# Patient Record
Sex: Male | Born: 1965 | Race: Black or African American | Hispanic: No | Marital: Married | Smoking: Never smoker
Health system: Southern US, Community
[De-identification: ages and names within clinical notes are randomized; demographics above are authoritative.]

## PROBLEM LIST (undated history)

## (undated) DIAGNOSIS — I10 Essential (primary) hypertension: Secondary | ICD-10-CM

## (undated) DIAGNOSIS — I509 Heart failure, unspecified: Secondary | ICD-10-CM

## (undated) DIAGNOSIS — N289 Disorder of kidney and ureter, unspecified: Secondary | ICD-10-CM

---

## 2007-06-16 ENCOUNTER — Emergency Department (HOSPITAL_COMMUNITY): Admission: EM | Admit: 2007-06-16 | Discharge: 2007-06-16 | Payer: Self-pay | Admitting: Emergency Medicine

## 2015-02-16 DIAGNOSIS — I2510 Atherosclerotic heart disease of native coronary artery without angina pectoris: Secondary | ICD-10-CM | POA: Insufficient documentation

## 2015-02-16 DIAGNOSIS — I10 Essential (primary) hypertension: Secondary | ICD-10-CM | POA: Diagnosis present

## 2017-04-28 DIAGNOSIS — N183 Chronic kidney disease, stage 3 unspecified: Secondary | ICD-10-CM | POA: Diagnosis present

## 2017-06-13 DIAGNOSIS — I428 Other cardiomyopathies: Secondary | ICD-10-CM

## 2019-04-18 DIAGNOSIS — E785 Hyperlipidemia, unspecified: Secondary | ICD-10-CM | POA: Insufficient documentation

## 2020-03-26 DIAGNOSIS — Z8639 Personal history of other endocrine, nutritional and metabolic disease: Secondary | ICD-10-CM | POA: Insufficient documentation

## 2020-11-29 ENCOUNTER — Emergency Department (HOSPITAL_COMMUNITY)
Admission: EM | Admit: 2020-11-29 | Discharge: 2020-11-29 | Disposition: A | Discharge status: Home / Self Care | Payer: BC Managed Care – PPO | Attending: Emergency Medicine | Admitting: Emergency Medicine

## 2020-11-29 ENCOUNTER — Encounter (HOSPITAL_COMMUNITY): Payer: Self-pay

## 2020-11-29 ENCOUNTER — Other Ambulatory Visit: Payer: Self-pay

## 2020-11-29 DIAGNOSIS — Z960 Presence of urogenital implants: Secondary | ICD-10-CM | POA: Insufficient documentation

## 2020-11-29 DIAGNOSIS — I509 Heart failure, unspecified: Secondary | ICD-10-CM | POA: Diagnosis not present

## 2020-11-29 DIAGNOSIS — I130 Hypertensive heart and chronic kidney disease with heart failure and stage 1 through stage 4 chronic kidney disease, or unspecified chronic kidney disease: Secondary | ICD-10-CM | POA: Diagnosis not present

## 2020-11-29 DIAGNOSIS — R339 Retention of urine, unspecified: Secondary | ICD-10-CM | POA: Insufficient documentation

## 2020-11-29 DIAGNOSIS — N189 Chronic kidney disease, unspecified: Secondary | ICD-10-CM | POA: Insufficient documentation

## 2020-11-29 HISTORY — DX: Essential (primary) hypertension: I10

## 2020-11-29 HISTORY — DX: Heart failure, unspecified: I50.9

## 2020-11-29 HISTORY — DX: Disorder of kidney and ureter, unspecified: N28.9

## 2020-11-29 LAB — BASIC METABOLIC PANEL
Anion gap: 15 (ref 5–15)
BUN: 23 mg/dL — ABNORMAL HIGH (ref 6–20)
CO2: 29 mmol/L (ref 22–32)
Calcium: 8.9 mg/dL (ref 8.9–10.3)
Chloride: 97 mmol/L — ABNORMAL LOW (ref 98–111)
Creatinine, Ser: 1.75 mg/dL — ABNORMAL HIGH (ref 0.61–1.24)
GFR, Estimated: 45 mL/min — ABNORMAL LOW (ref >60)
Glucose, Bld: 118 mg/dL — ABNORMAL HIGH (ref 70–99)
Potassium: 2.7 mmol/L — CL (ref 3.5–5.1)
Sodium: 141 mmol/L (ref 135–145)

## 2020-11-29 LAB — URINALYSIS, ROUTINE W REFLEX MICROSCOPIC
Bacteria, UA: NONE SEEN
Bilirubin Urine: NEGATIVE
Glucose, UA: NEGATIVE mg/dL
Ketones, ur: NEGATIVE mg/dL
Leukocytes,Ua: NEGATIVE
Nitrite: NEGATIVE
Protein, ur: 30 mg/dL — AB
Specific Gravity, Urine: 1.006 (ref 1.005–1.030)
pH: 8 (ref 5.0–8.0)

## 2020-11-29 LAB — CBC
HCT: 48.1 % (ref 39.0–52.0)
Hemoglobin: 16.1 g/dL (ref 13.0–17.0)
MCH: 32.1 pg (ref 26.0–34.0)
MCHC: 33.5 g/dL (ref 30.0–36.0)
MCV: 95.8 fL (ref 80.0–100.0)
Platelets: 225 10*3/uL (ref 150–400)
RBC: 5.02 MIL/uL (ref 4.22–5.81)
RDW: 15.5 % (ref 11.5–15.5)
WBC: 6 10*3/uL (ref 4.0–10.5)
nRBC: 0 % (ref 0.0–0.2)

## 2020-11-29 LAB — MAGNESIUM: Magnesium: 1.5 mg/dL — ABNORMAL LOW (ref 1.7–2.4)

## 2020-11-29 MED ORDER — POTASSIUM CHLORIDE 10 MEQ/100ML IV SOLN
10.0000 meq | INTRAVENOUS | Status: AC
Start: 2020-11-29 — End: 2020-11-29
  Administered 2020-11-29 (×2): 10 meq via INTRAVENOUS
  Filled 2020-11-29 (×2): qty 100

## 2020-11-29 MED ORDER — POTASSIUM CHLORIDE CRYS ER 20 MEQ PO TBCR: 20.0000 meq | EXTENDED_RELEASE_TABLET | Freq: Two times a day (BID) | ORAL | 0 refills | Status: DC

## 2020-11-29 MED ORDER — POTASSIUM CHLORIDE CRYS ER 20 MEQ PO TBCR
40.0000 meq | EXTENDED_RELEASE_TABLET | Freq: Once | ORAL | Status: AC
  Administered 2020-11-29: 40 meq via ORAL
  Filled 2020-11-29: qty 2

## 2020-11-29 MED ORDER — MAGNESIUM SULFATE 2 GM/50ML IV SOLN
2.0000 g | Freq: Once | INTRAVENOUS | Status: AC
  Administered 2020-11-29: 2 g via INTRAVENOUS
  Filled 2020-11-29: qty 50

## 2020-11-29 NOTE — ED Notes (Signed)
Leg bag connected - instructed pt and family how to empty.

## 2020-11-29 NOTE — ED Triage Notes (Signed)
Pt reports urinary retention for several hours. Pt recently began on Torsemide for HTN. Suprapubic pain.

## 2020-11-29 NOTE — ED Provider Notes (Signed)
[facility]-EMERGENCY DEPT Provider Note   CSN: [id number] Arrival date & time: 11/29/20  3810     History Chief Complaint  Patient presents with  . Urinary Retention    Oscar Rosales is a 55 y.o. male with history of severe hypertension and congestive heart failure as well as CKD who presents with concern for urinary retention that began around 3:00 this morning.  He recently was started on torsemide, and has not had any issue however he states that in the middle of the night he woke to urinate x2 without difficulty and subsequently woke a third time to urinate was not able to do so.  Endorses gradually worsening suprapubic pain after 3:00 this morning, and inability to urinate despite multiple attempts at effort.  He has never had an issue with urinary retention in the past.  Has no history of BPH.  He denies any hematuria, dysuria, but endorses urinary frequency and urgency since starting torsemide.  He denies any fevers or chills at home.  Was recently admitted to [facility] in January 2022 for COVID-19 pneumonia and acute respiratory failure.  I personally reviewed his previous CHF on digoxin, hypertension on multiple oral medications + clonidine patch, chronic kidney disease, history of hep C, dyslipidemia.  He follows with Dr. Sullivan Lone at [facility] cardiology.  LVEF 25 to 30% on TTE in 6/21.  HPI     Past Medical History:  Diagnosis Date  . CHF (congestive heart failure) (HCC)   . Hypertension   . Renal disorder     There are no problems to display for this patient.   History reviewed. No pertinent surgical history.     No family history on file.  Social History   Tobacco Use  . Smoking status: Never Smoker  . Smokeless tobacco: Never Used  Substance Use Topics  . Drug use: Never    Home Medications Prior to Admission medications   Not on File    Allergies    Lisinopril  Review of Systems   Review of  Systems  Constitutional: Negative.  Negative for chills, fatigue and fever.  HENT: Negative.   Eyes: Negative.   Respiratory: Negative.   Cardiovascular: Negative.   Gastrointestinal: Positive for abdominal pain. Negative for constipation, diarrhea, nausea and vomiting.  Genitourinary: Positive for difficulty urinating. Negative for decreased urine volume, dysuria, flank pain, frequency, hematuria, penile discharge, penile pain, penile swelling, scrotal swelling, testicular pain and urgency.  Musculoskeletal: Negative.   Skin: Negative.   Neurological: Negative.     Physical Exam Updated Vital Signs BP (!) 175/124   Pulse 100   Temp 97.7 F (36.5 C) (Oral)   Resp (!) 27   Ht 5\' 11"  (1.803 m)   Wt 77.1 kg   SpO2 96%   BMI 23.71 kg/m   Physical Exam Vitals and nursing note reviewed. Exam conducted with a chaperone present.  Constitutional:      Appearance: He is normal weight.  HENT:     Head: Normocephalic and atraumatic.     Nose: Nose normal.     Mouth/Throat:     Mouth: Mucous membranes are moist.     Pharynx: Oropharynx is clear. Uvula midline. No oropharyngeal exudate or posterior oropharyngeal erythema.     Tonsils: No tonsillar exudate.  Eyes:     General: Lids are normal. Vision grossly intact.        Right eye: No discharge.  Left eye: No discharge.     Extraocular Movements: Extraocular movements intact.     Conjunctiva/sclera: Conjunctivae normal.     Pupils: Pupils are equal, round, and reactive to light.  Neck:     Trachea: Trachea normal.  Cardiovascular:     Rate and Rhythm: Normal rate. Rhythm irregular.     Pulses: Normal pulses.     Heart sounds: Normal heart sounds. No murmur heard.   Pulmonary:     Effort: Pulmonary effort is normal. No respiratory distress.     Breath sounds: Normal breath sounds. No wheezing or rales.  Chest:     Chest wall: No swelling, tenderness, crepitus or edema.  Abdominal:     General: Bowel sounds are  normal. There is no distension.     Palpations: Abdomen is soft.     Tenderness: There is no abdominal tenderness.  Genitourinary:    Penis: Normal.      Testes: Normal.     Comments: Foley catheter in place, 350 cc in the foley bag.  Musculoskeletal:        General: No deformity.     Cervical back: Neck supple. No tenderness or crepitus. No pain with movement, spinous process tenderness or muscular tenderness.     Right lower leg: 1+ Edema present.     Left lower leg: 2+ Edema present.  Lymphadenopathy:     Cervical: No cervical adenopathy.  Skin:    General: Skin is warm and dry.     Capillary Refill: Capillary refill takes less than 2 seconds.  Neurological:     General: No focal deficit present.     Mental Status: He is alert and oriented to person, place, and time. Mental status is at baseline.     Cranial Nerves: Cranial nerves are intact.     Sensory: Sensation is intact.     Motor: Motor function is intact.     Coordination: Coordination is intact.  Psychiatric:        Mood and Affect: Mood normal.     ED Results / Procedures / Treatments   Labs (all labs ordered are listed, but only abnormal results are displayed) Labs Reviewed  URINALYSIS, ROUTINE W REFLEX MICROSCOPIC - Abnormal; Notable for the following components:      Result Value   Color, Urine STRAW (*)    Hgb urine dipstick SMALL (*)    Protein, ur 30 (*)    All other components within normal limits  BASIC METABOLIC PANEL - Abnormal; Notable for the following components:   Potassium 2.7 (*)    Chloride 97 (*)    Glucose, Bld 118 (*)    BUN 23 (*)    Creatinine, Ser 1.75 (*)    GFR, Estimated 45 (*)    All other components within normal limits  MAGNESIUM - Abnormal; Notable for the following components:   Magnesium 1.5 (*)    All other components within normal limits  URINE CULTURE  CBC    EKG EKG Interpretation  Date/Time:  Thursday November 29 2020 08:51:23 EDT Ventricular Rate:  85 PR  Interval:    QRS Duration: 109 QT Interval:  404 QTC Calculation: 481 R Axis:   -38 Text Interpretation: Sinus rhythm Biatrial enlargement Abnormal R-wave progression, late transition LVH with secondary repolarization abnormality Confirmed by Kristine Royal [phone]) on 11/29/2020 9:04:13 AM   Radiology No results found.  Procedures Procedures  Medications Ordered in ED Medications  potassium chloride 10 mEq in 100 mL IVPB (0  mEq Intravenous Stopped 11/29/20 1109)  potassium chloride SA (KLOR-CON) CR tablet 40 mEq (40 mEq Oral Given 11/29/20 0744)  magnesium sulfate IVPB 2 g 50 mL (2 g Intravenous New Bag/Given 11/29/20 1132)    ED Course  I have reviewed the triage vital signs and the nursing notes.  Pertinent labs & imaging results that were available during my care of the patient were reviewed by me and considered in my medical decision making (see chart for details).    MDM Rules/Calculators/A&P                         55 year old male with history of CHF, hypertension, and chronic kidney disease who presents with concern for urinary retention since 3 AM today.  Hypertensive and tachycardic on intake.  Vital signs improved after placement of Foley catheter.  Cardiopulmonary exam revealed irregular heart rhythm, abdominal exam is benign at this time, and there is 350 cc of urine in the Foley catheter at the time of my initial exam.  There is L>R lower extremity edema, nonpitting.  Patient states that has improved since beginning torsemide.  UA revealed small amount of hemoglobin and proteinuria without sign of infection.  CBC is unremarkable, BMP revealed baseline creatinine 1.75, and severe hypokalemia with potassium of 2.7.  EKG without STEMI. Hypomagnesemia of 1.5.  Will replete potassium orally and via IV, will replete magnesium IV.  Patient will be discharged with Foley catheter in place with recommendation to follow-up closely with urology for removal and further evaluation, as  well as cardiology for evaluation of torsemide dosage.   Loi and his wife at the bedside voiced understanding of his medical evaluation and treatment plan.  Each of their questions was answered to their expressed satisfaction.  Return precautions given.  Patient is stable and appropriate for discharge.  This chart was dictated using voice recognition software, Dragon. Despite the best efforts of this provider to proofread and correct errors, errors may still occur which can change documentation meaning.  Final Clinical Impression(s) / ED Diagnoses Final diagnoses:  Urinary retention    Rx / DC Orders ED Discharge Orders    None       Sherrilee Gilles 11/29/20 1232    Wynetta Fines, MD 12/01/20 2158

## 2020-11-29 NOTE — ED Notes (Signed)
650 ml of urine emptied from folly catheter

## 2020-11-29 NOTE — Discharge Instructions (Addendum)
You were evaluated in the emergency department for your inability to urinate this morning.  Foley catheter was placed to help your body drain urine, and prevent you from experiencing the same pain you had this morning.    Your blood work revealed that your potassium and magnesium are low.  Both of these were supplemented through your IV.  You have been prescribed potassium to take at home for the next few days, and should continue to take your magnesium supplement at home. Please call your cardiologist to discuss the dosage of your torsemide, and any adjustments they may need to make after today, since your potassium and magnesium were low.  Below is the contact information for Dr. Benancio Deeds, the urologist; please call his office today to schedule follow-up appointment for removal of her Foley catheter.  Return emergency department develop any worsening abdominal pain, nausea or vomiting does not stop, penile pain, fevers, chills, or any other new severe symptoms.

## 2020-11-30 LAB — URINE CULTURE
Culture: NO GROWTH
Special Requests: NORMAL

## 2021-02-15 DIAGNOSIS — Z9114 Patient's other noncompliance with medication regimen: Secondary | ICD-10-CM | POA: Insufficient documentation

## 2021-10-11 ENCOUNTER — Encounter (HOSPITAL_COMMUNITY): Payer: Self-pay | Admitting: Emergency Medicine

## 2021-10-11 ENCOUNTER — Emergency Department (HOSPITAL_COMMUNITY): Payer: BC Managed Care – PPO

## 2021-10-11 ENCOUNTER — Other Ambulatory Visit: Payer: Self-pay

## 2021-10-11 ENCOUNTER — Inpatient Hospital Stay (HOSPITAL_COMMUNITY)
Admission: EM | Admit: 2021-10-11 | Discharge: 2021-10-25 | DRG: 064 | Disposition: A | Discharge status: Rehabilitation Facility | Payer: BC Managed Care – PPO | Attending: Internal Medicine | Admitting: Internal Medicine

## 2021-10-11 DIAGNOSIS — D509 Iron deficiency anemia, unspecified: Secondary | ICD-10-CM | POA: Diagnosis present

## 2021-10-11 DIAGNOSIS — I2510 Atherosclerotic heart disease of native coronary artery without angina pectoris: Secondary | ICD-10-CM | POA: Diagnosis present

## 2021-10-11 DIAGNOSIS — Z681 Body mass index (BMI) 19 or less, adult: Secondary | ICD-10-CM

## 2021-10-11 DIAGNOSIS — E785 Hyperlipidemia, unspecified: Secondary | ICD-10-CM | POA: Diagnosis present

## 2021-10-11 DIAGNOSIS — I119 Hypertensive heart disease without heart failure: Secondary | ICD-10-CM | POA: Diagnosis present

## 2021-10-11 DIAGNOSIS — W19XXXA Unspecified fall, initial encounter: Secondary | ICD-10-CM

## 2021-10-11 DIAGNOSIS — Z888 Allergy status to other drugs, medicaments and biological substances status: Secondary | ICD-10-CM

## 2021-10-11 DIAGNOSIS — I639 Cerebral infarction, unspecified: Secondary | ICD-10-CM

## 2021-10-11 DIAGNOSIS — I6340 Cerebral infarction due to embolism of unspecified cerebral artery: Secondary | ICD-10-CM | POA: Diagnosis not present

## 2021-10-11 DIAGNOSIS — Z91199 Patient's noncompliance with other medical treatment and regimen due to unspecified reason: Secondary | ICD-10-CM

## 2021-10-11 DIAGNOSIS — N183 Chronic kidney disease, stage 3 unspecified: Secondary | ICD-10-CM | POA: Diagnosis present

## 2021-10-11 DIAGNOSIS — N179 Acute kidney failure, unspecified: Secondary | ICD-10-CM | POA: Diagnosis present

## 2021-10-11 DIAGNOSIS — I493 Ventricular premature depolarization: Secondary | ICD-10-CM | POA: Diagnosis not present

## 2021-10-11 DIAGNOSIS — I428 Other cardiomyopathies: Secondary | ICD-10-CM

## 2021-10-11 DIAGNOSIS — I4891 Unspecified atrial fibrillation: Secondary | ICD-10-CM | POA: Diagnosis present

## 2021-10-11 DIAGNOSIS — R410 Disorientation, unspecified: Secondary | ICD-10-CM

## 2021-10-11 DIAGNOSIS — Z20822 Contact with and (suspected) exposure to covid-19: Secondary | ICD-10-CM | POA: Diagnosis present

## 2021-10-11 DIAGNOSIS — T1490XA Injury, unspecified, initial encounter: Secondary | ICD-10-CM

## 2021-10-11 DIAGNOSIS — I169 Hypertensive crisis, unspecified: Secondary | ICD-10-CM | POA: Diagnosis present

## 2021-10-11 DIAGNOSIS — H5702 Anisocoria: Secondary | ICD-10-CM | POA: Diagnosis present

## 2021-10-11 DIAGNOSIS — R0902 Hypoxemia: Secondary | ICD-10-CM

## 2021-10-11 DIAGNOSIS — N1832 Chronic kidney disease, stage 3b: Secondary | ICD-10-CM | POA: Diagnosis present

## 2021-10-11 DIAGNOSIS — Z79899 Other long term (current) drug therapy: Secondary | ICD-10-CM

## 2021-10-11 DIAGNOSIS — I5023 Acute on chronic systolic (congestive) heart failure: Secondary | ICD-10-CM | POA: Diagnosis present

## 2021-10-11 DIAGNOSIS — E876 Hypokalemia: Secondary | ICD-10-CM | POA: Diagnosis not present

## 2021-10-11 DIAGNOSIS — I43 Cardiomyopathy in diseases classified elsewhere: Secondary | ICD-10-CM | POA: Diagnosis present

## 2021-10-11 DIAGNOSIS — I472 Ventricular tachycardia, unspecified: Secondary | ICD-10-CM | POA: Diagnosis not present

## 2021-10-11 DIAGNOSIS — E440 Moderate protein-calorie malnutrition: Secondary | ICD-10-CM | POA: Insufficient documentation

## 2021-10-11 DIAGNOSIS — Z9114 Patient's other noncompliance with medication regimen: Secondary | ICD-10-CM

## 2021-10-11 DIAGNOSIS — D539 Nutritional anemia, unspecified: Secondary | ICD-10-CM | POA: Diagnosis present

## 2021-10-11 DIAGNOSIS — I130 Hypertensive heart and chronic kidney disease with heart failure and stage 1 through stage 4 chronic kidney disease, or unspecified chronic kidney disease: Secondary | ICD-10-CM | POA: Diagnosis present

## 2021-10-11 DIAGNOSIS — G9340 Encephalopathy, unspecified: Secondary | ICD-10-CM | POA: Diagnosis present

## 2021-10-11 DIAGNOSIS — R29702 NIHSS score 2: Secondary | ICD-10-CM | POA: Diagnosis present

## 2021-10-11 DIAGNOSIS — I2699 Other pulmonary embolism without acute cor pulmonale: Secondary | ICD-10-CM

## 2021-10-11 DIAGNOSIS — I509 Heart failure, unspecified: Secondary | ICD-10-CM

## 2021-10-11 DIAGNOSIS — I7121 Aneurysm of the ascending aorta, without rupture: Secondary | ICD-10-CM | POA: Diagnosis present

## 2021-10-11 DIAGNOSIS — I214 Non-ST elevation (NSTEMI) myocardial infarction: Secondary | ICD-10-CM

## 2021-10-11 DIAGNOSIS — D7589 Other specified diseases of blood and blood-forming organs: Secondary | ICD-10-CM | POA: Diagnosis not present

## 2021-10-11 DIAGNOSIS — G4733 Obstructive sleep apnea (adult) (pediatric): Secondary | ICD-10-CM | POA: Diagnosis present

## 2021-10-11 DIAGNOSIS — I10 Essential (primary) hypertension: Secondary | ICD-10-CM

## 2021-10-11 LAB — CBC
HCT: 45.3 % (ref 39.0–52.0)
Hemoglobin: 14.1 g/dL (ref 13.0–17.0)
MCH: 31.2 pg (ref 26.0–34.0)
MCHC: 31.1 g/dL (ref 30.0–36.0)
MCV: 100.2 fL — ABNORMAL HIGH (ref 80.0–100.0)
Platelets: 165 10*3/uL (ref 150–400)
RBC: 4.52 MIL/uL (ref 4.22–5.81)
RDW: 15.4 % (ref 11.5–15.5)
WBC: 6.5 10*3/uL (ref 4.0–10.5)
nRBC: 0 % (ref 0.0–0.2)

## 2021-10-11 LAB — OSMOLALITY: Osmolality: 291 mOsm/kg (ref 275–295)

## 2021-10-11 LAB — URINALYSIS, ROUTINE W REFLEX MICROSCOPIC
Bilirubin Urine: NEGATIVE
Glucose, UA: NEGATIVE mg/dL
Hgb urine dipstick: NEGATIVE
Ketones, ur: NEGATIVE mg/dL
Leukocytes,Ua: NEGATIVE
Nitrite: NEGATIVE
Protein, ur: 100 mg/dL — AB
Specific Gravity, Urine: 1.016 (ref 1.005–1.030)
pH: 5 (ref 5.0–8.0)

## 2021-10-11 LAB — I-STAT VENOUS BLOOD GAS, ED
Acid-Base Excess: 5 mmol/L — ABNORMAL HIGH (ref 0.0–2.0)
Bicarbonate: 28.6 mmol/L — ABNORMAL HIGH (ref 20.0–28.0)
Calcium, Ion: 1.03 mmol/L — ABNORMAL LOW (ref 1.15–1.40)
HCT: 43 % (ref 39.0–52.0)
Hemoglobin: 14.6 g/dL (ref 13.0–17.0)
O2 Saturation: 77 %
Potassium: 3.5 mmol/L (ref 3.5–5.1)
Sodium: 139 mmol/L (ref 135–145)
TCO2: 30 mmol/L (ref 22–32)
pCO2, Ven: 36.5 mmHg — ABNORMAL LOW (ref 44.0–60.0)
pH, Ven: 7.502 — ABNORMAL HIGH (ref 7.250–7.430)
pO2, Ven: 38 mmHg (ref 32.0–45.0)

## 2021-10-11 LAB — COMPREHENSIVE METABOLIC PANEL
ALT: 27 U/L (ref 0–44)
AST: 30 U/L (ref 15–41)
Albumin: 2.6 g/dL — ABNORMAL LOW (ref 3.5–5.0)
Alkaline Phosphatase: 61 U/L (ref 38–126)
Anion gap: 12 (ref 5–15)
BUN: 27 mg/dL — ABNORMAL HIGH (ref 6–20)
CO2: 24 mmol/L (ref 22–32)
Calcium: 8.3 mg/dL — ABNORMAL LOW (ref 8.9–10.3)
Chloride: 103 mmol/L (ref 98–111)
Creatinine, Ser: 1.88 mg/dL — ABNORMAL HIGH (ref 0.61–1.24)
GFR, Estimated: 41 mL/min — ABNORMAL LOW (ref >60)
Glucose, Bld: 93 mg/dL (ref 70–99)
Potassium: 3.4 mmol/L — ABNORMAL LOW (ref 3.5–5.1)
Sodium: 139 mmol/L (ref 135–145)
Total Bilirubin: 1.4 mg/dL — ABNORMAL HIGH (ref 0.3–1.2)
Total Protein: 5.2 g/dL — ABNORMAL LOW (ref 6.5–8.1)

## 2021-10-11 LAB — PROTIME-INR
INR: 1.3 — ABNORMAL HIGH (ref 0.8–1.2)
Prothrombin Time: 15.7 seconds — ABNORMAL HIGH (ref 11.4–15.2)

## 2021-10-11 LAB — I-STAT CHEM 8, ED
BUN: 34 mg/dL — ABNORMAL HIGH (ref 6–20)
Calcium, Ion: 1.01 mmol/L — ABNORMAL LOW (ref 1.15–1.40)
Chloride: 102 mmol/L (ref 98–111)
Creatinine, Ser: 1.9 mg/dL — ABNORMAL HIGH (ref 0.61–1.24)
Glucose, Bld: 91 mg/dL (ref 70–99)
HCT: 44 % (ref 39.0–52.0)
Hemoglobin: 15 g/dL (ref 13.0–17.0)
Potassium: 3.5 mmol/L (ref 3.5–5.1)
Sodium: 139 mmol/L (ref 135–145)
TCO2: 28 mmol/L (ref 22–32)

## 2021-10-11 LAB — HEPARIN LEVEL (UNFRACTIONATED): Heparin Unfractionated: 0.87 IU/mL — ABNORMAL HIGH (ref 0.30–0.70)

## 2021-10-11 LAB — ETHANOL: Alcohol, Ethyl (B): <10 mg/dL (ref <10)

## 2021-10-11 LAB — HEMOGLOBIN A1C
Hgb A1c MFr Bld: 5.2 % (ref 4.8–5.6)
Mean Plasma Glucose: 102.54 mg/dL

## 2021-10-11 LAB — BRAIN NATRIURETIC PEPTIDE: B Natriuretic Peptide: 3942.1 pg/mL — ABNORMAL HIGH (ref 0.0–100.0)

## 2021-10-11 LAB — RESP PANEL BY RT-PCR (FLU A&B, COVID) ARPGX2
Influenza A by PCR: NEGATIVE
Influenza B by PCR: NEGATIVE
SARS Coronavirus 2 by RT PCR: NEGATIVE

## 2021-10-11 LAB — CK: Total CK: 290 U/L (ref 49–397)

## 2021-10-11 LAB — RAPID URINE DRUG SCREEN, HOSP PERFORMED
Amphetamines: NOT DETECTED
Barbiturates: NOT DETECTED
Benzodiazepines: NOT DETECTED
Cocaine: NOT DETECTED
Opiates: NOT DETECTED
Tetrahydrocannabinol: NOT DETECTED

## 2021-10-11 LAB — D-DIMER, QUANTITATIVE: D-Dimer, Quant: 0.84 ug/mL-FEU — ABNORMAL HIGH (ref 0.00–0.50)

## 2021-10-11 LAB — SAMPLE TO BLOOD BANK

## 2021-10-11 LAB — TROPONIN I (HIGH SENSITIVITY)
Troponin I (High Sensitivity): 48 ng/L — ABNORMAL HIGH (ref <18)
Troponin I (High Sensitivity): 50 ng/L — ABNORMAL HIGH (ref <18)

## 2021-10-11 LAB — LACTIC ACID, PLASMA: Lactic Acid, Venous: 1.7 mmol/L (ref 0.5–1.9)

## 2021-10-11 IMAGING — CT CT HEAD W/O CM
3 of 5 series · 15 of 47 positions shown, 18 images · non-contrast
Comparison: [DATE].

CLINICAL DATA: Trauma.



[Series 4: head 2.0 h70h · axial · 0.42mm/px · z∈[-120,+36]mm · 9 of 90 slices shown, 12 images]
[im 6/90  brain]
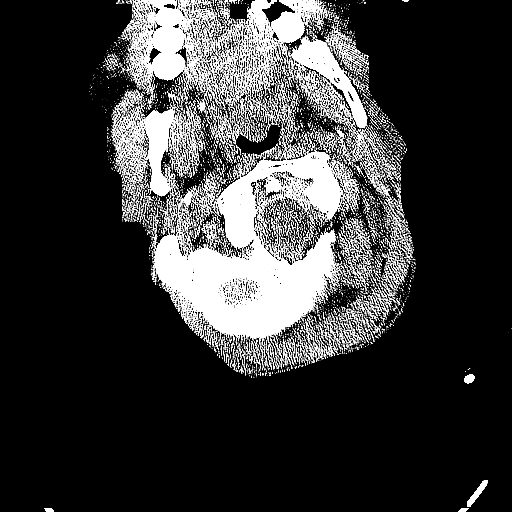
[im 6/90  bone]
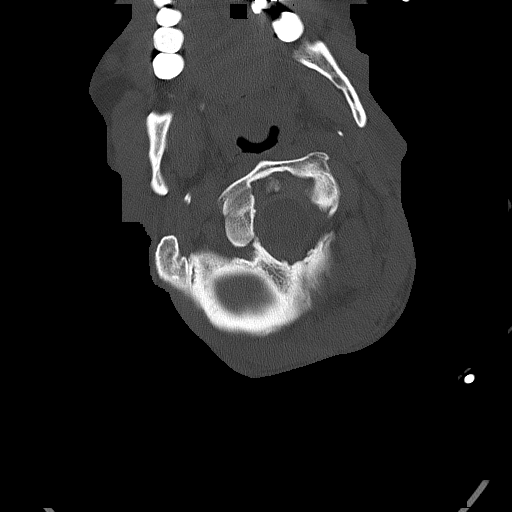
[im 17/90  brain]
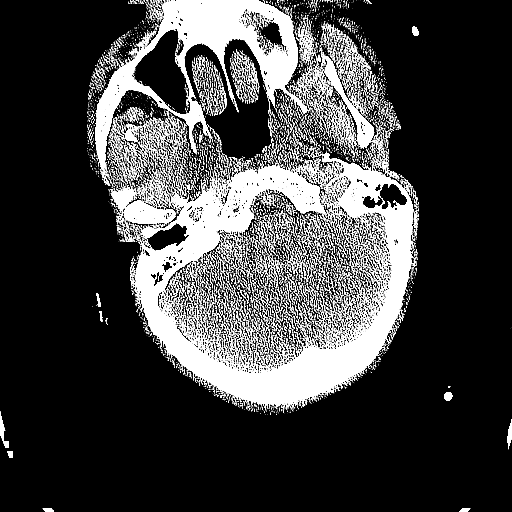
[im 28/90  brain]
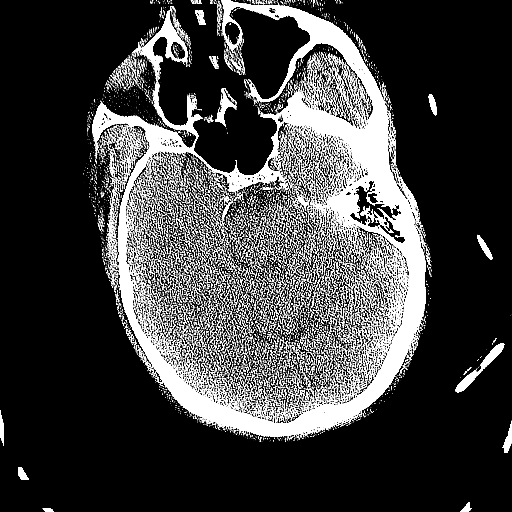
[im 34/90  brain]
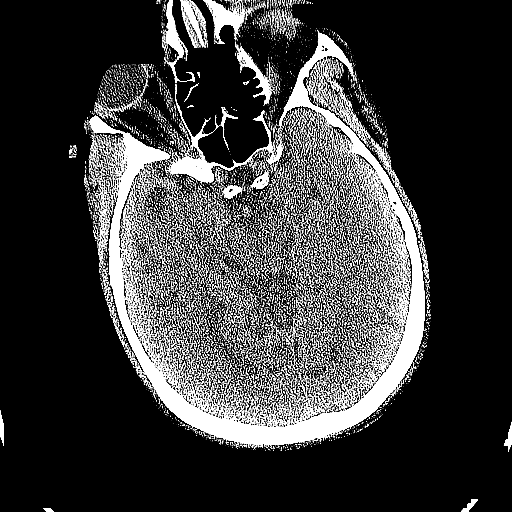
[im 45/90  brain]
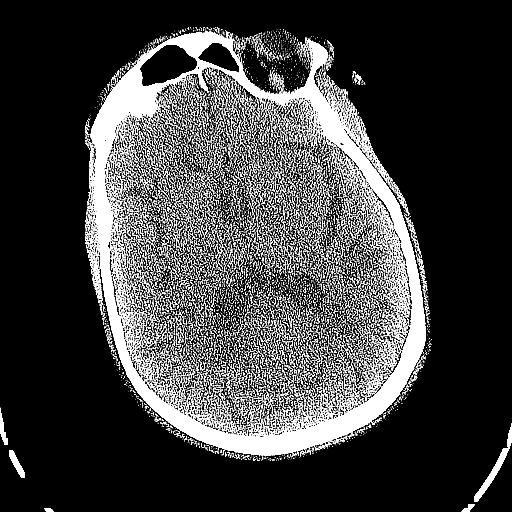
[im 45/90  bone]
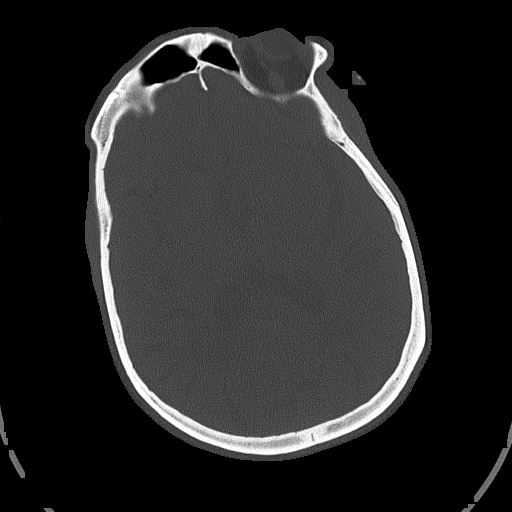
[im 56/90  brain]
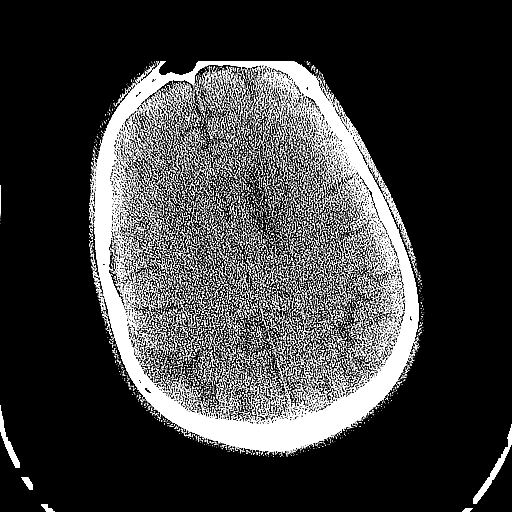
[im 62/90  brain]
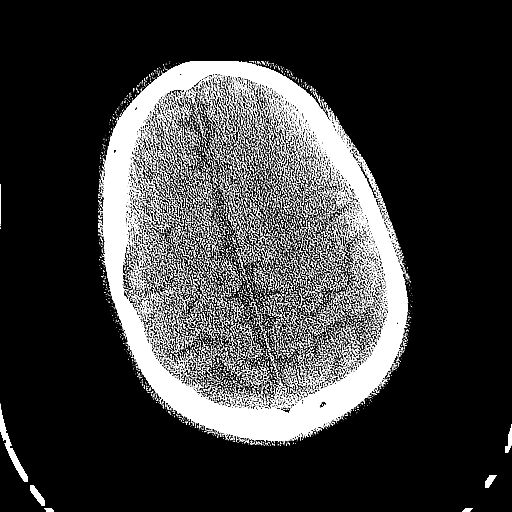
[im 73/90  brain]
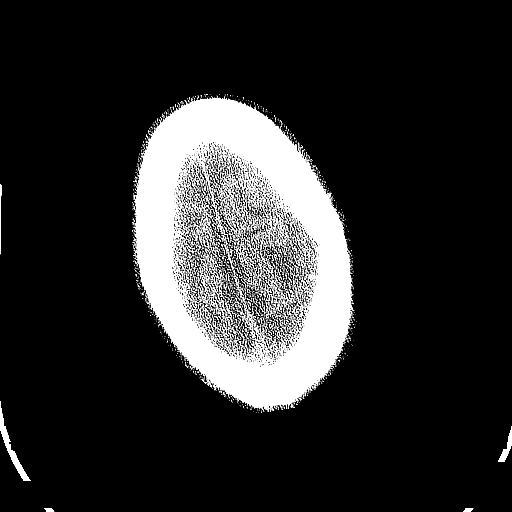
[im 84/90  brain]
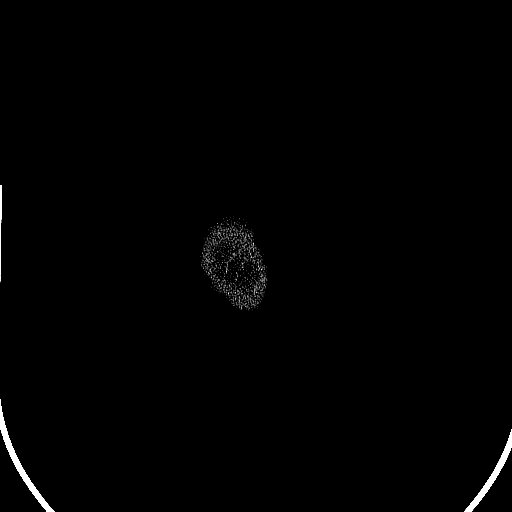
[im 84/90  bone]
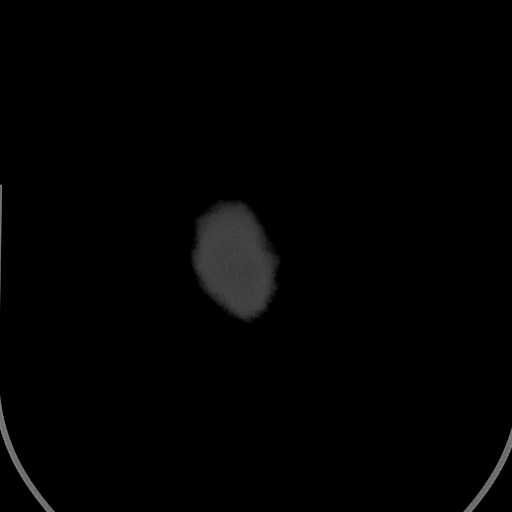

[Series 5: head 3.0 mpr cor · coronal · 0.36mm/px · 3 of 77 slices shown]
[im 26/77  brain]
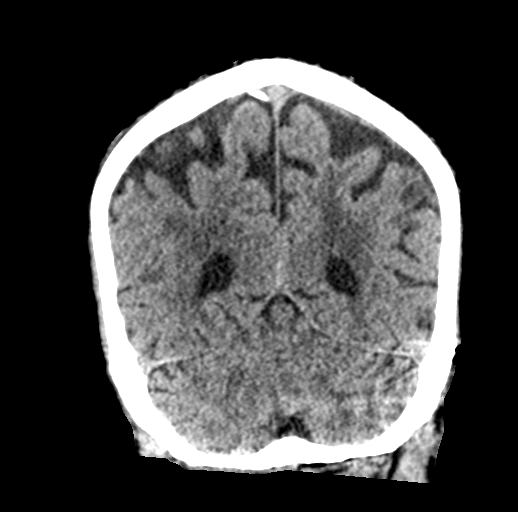
[im 34/77  brain]
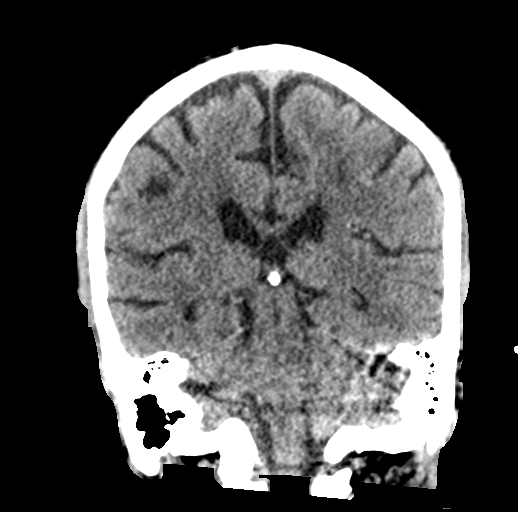
[im 43/77  brain]
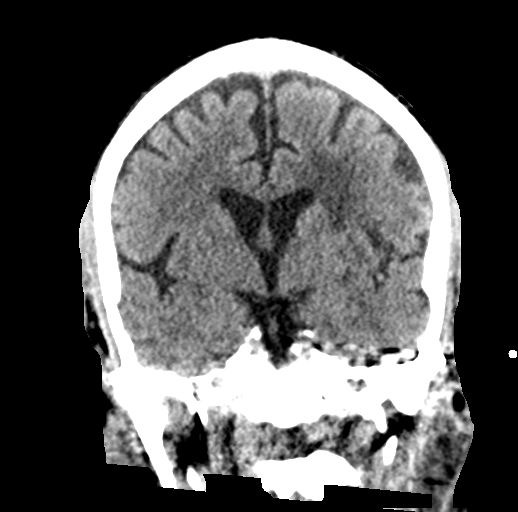

[Series 6: head 3.0 mpr sag · sagittal · 0.35mm/px · 3 of 67 slices shown]
[im 27/67  brain]
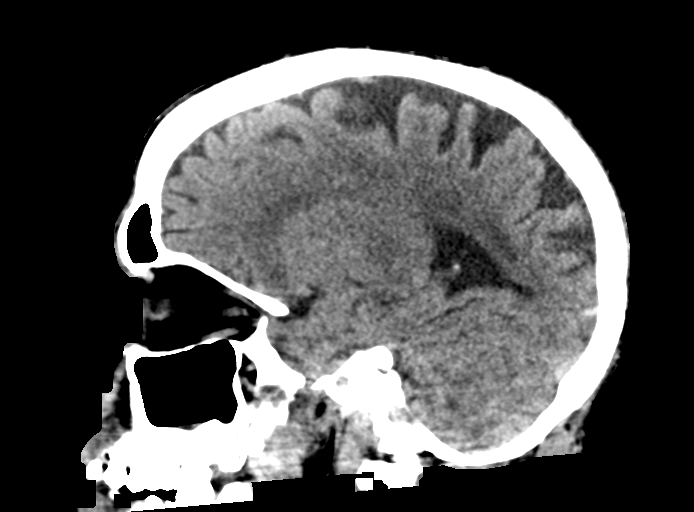
[im 34/67  brain]
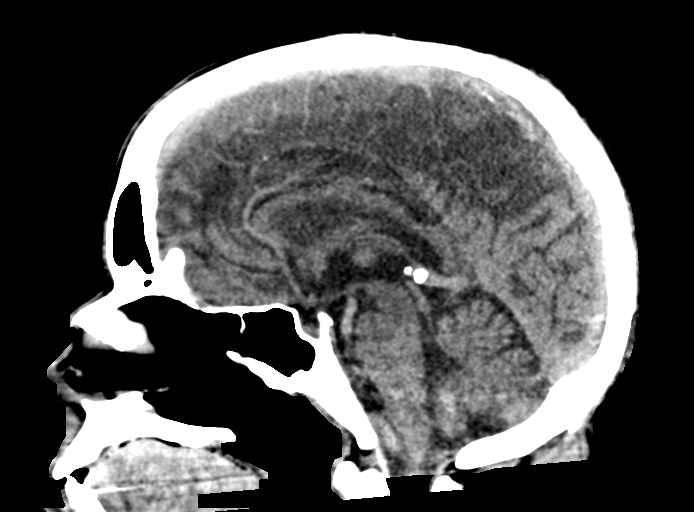
[im 40/67  brain]
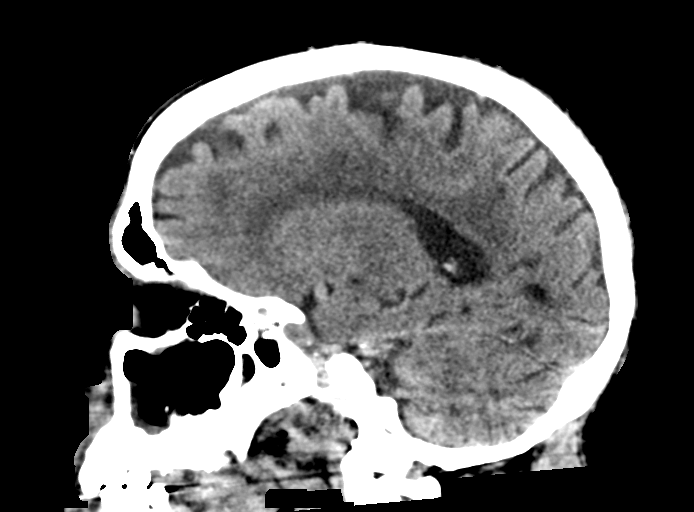

[15 of 47 positions shown; findings below may reference images not displayed]

FINDINGS: CT HEAD FINDINGS

Brain: Mild chronic ischemic white matter disease is noted. No mass
effect or midline shift is noted. Ventricular size is within normal
limits. There is no evidence of mass lesion, hemorrhage or acute
infarction.

Vascular: No hyperdense vessel or unexpected calcification.

Skull: Normal. Negative for fracture or focal lesion.

Other: None.

CT MAXILLOFACIAL FINDINGS

Osseous: No fracture or mandibular dislocation. No destructive
process.

Orbits: Negative. No traumatic or inflammatory finding.

Sinuses: Clear.

Soft tissues: Negative.

CT CERVICAL SPINE FINDINGS

Alignment: Grade 1 retrolisthesis of C3-4 and C4-5 is noted
secondary to moderate degenerative disc disease at these levels.

Skull base and vertebrae: No acute fracture. No primary bone lesion
or focal pathologic process.

Soft tissues and spinal canal: No prevertebral fluid or swelling. No
visible canal hematoma.

Disc levels: Moderate degenerative disc disease is noted at C3-4,
C4-5 and C6-7.

Upper chest: Negative.

Other: None.
IMPRESSION: No acute intracranial abnormality seen.

No definite abnormality seen in maxillofacial region.

Moderate multilevel degenerative disc disease is noted in the
cervical spine. No fracture or other acute abnormality is noted in
the cervical spine.

## 2021-10-11 IMAGING — CT CT MAXILLOFACIAL W/O CM
4 series · 16 of 47 positions shown, 18 images · non-contrast
Comparison: [DATE].

CLINICAL DATA: Trauma.



[Series 3: facial/ orbits 2.0 (person_name)30(person_name) (p · axial · 0.37mm/px · z∈[-167,-33]mm · 8 of 87 slices shown, 10 images]
[im 10/87  brain]
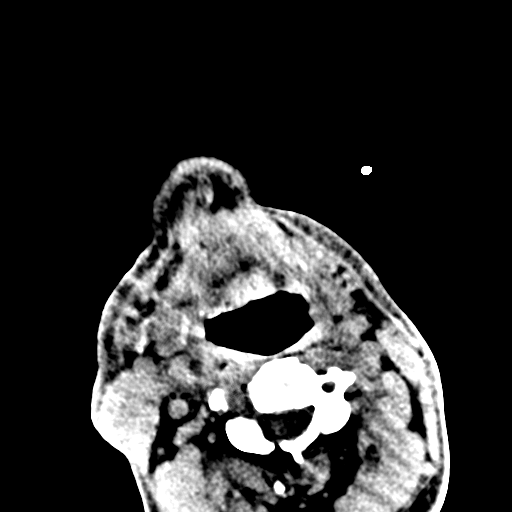
[im 10/87  bone]
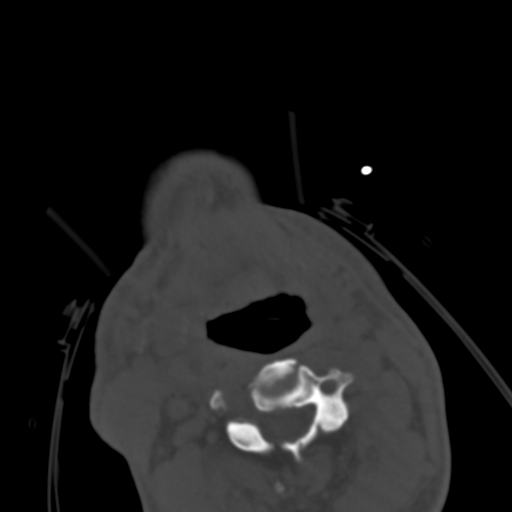
[im 20/87  bone]
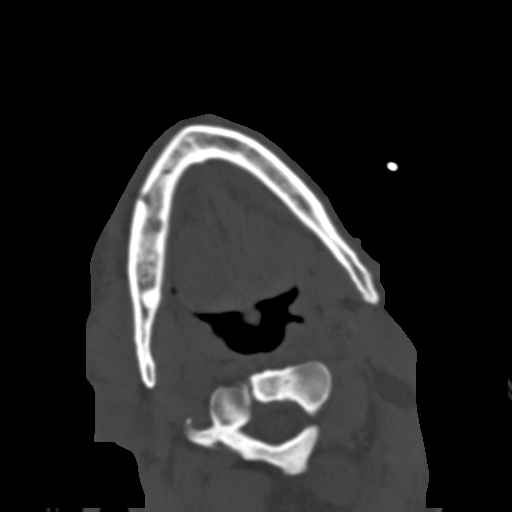
[im 29/87  bone]
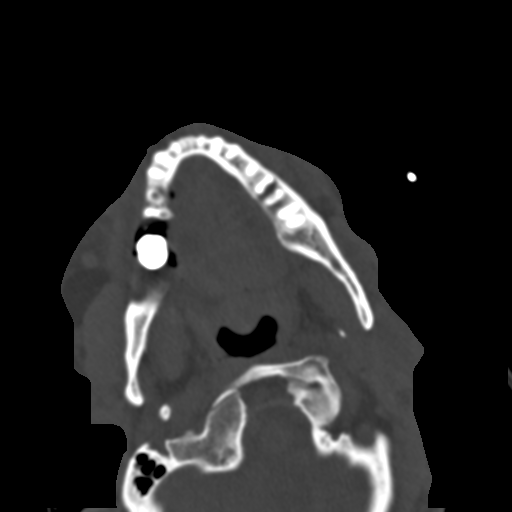
[im 39/87  bone]
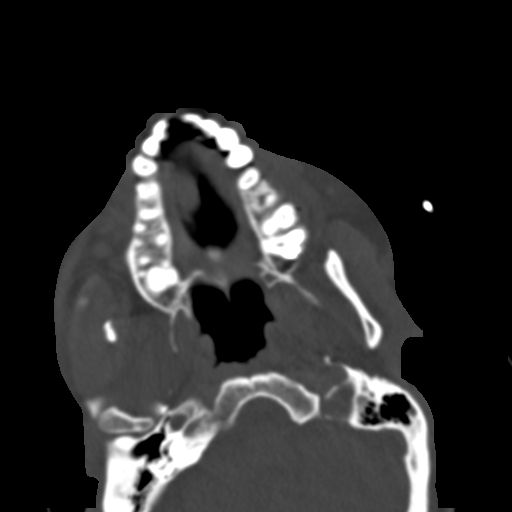
[im 48/87  brain]
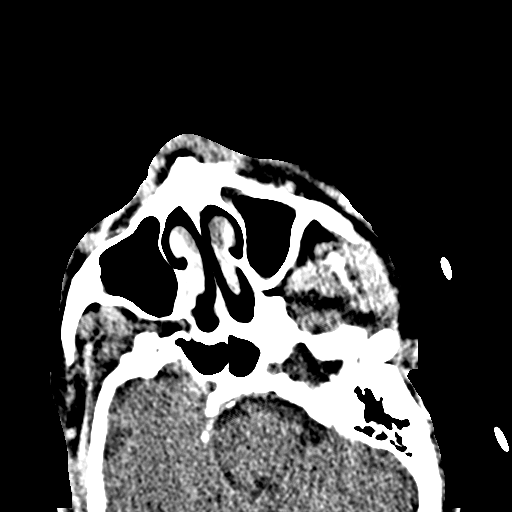
[im 48/87  bone]
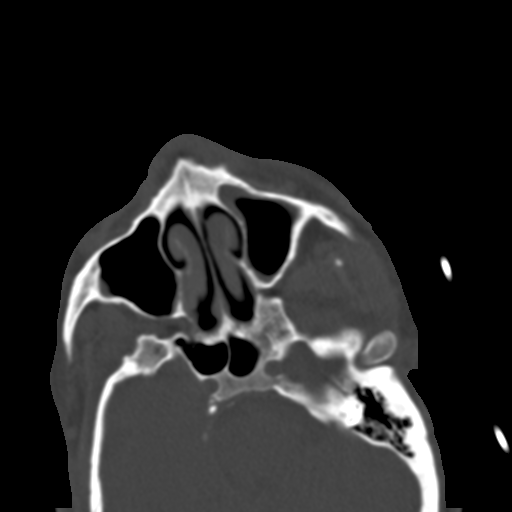
[im 58/87  bone]
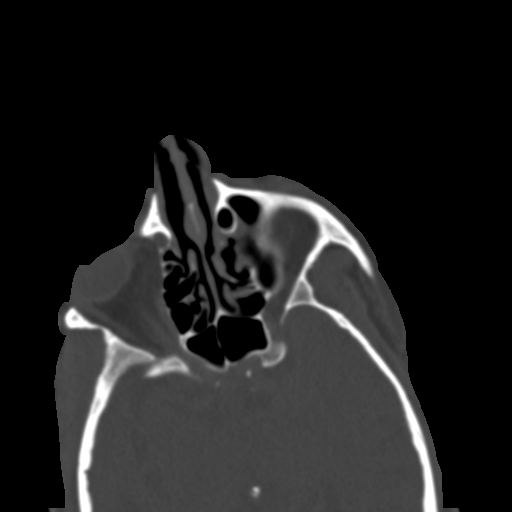
[im 67/87  bone]
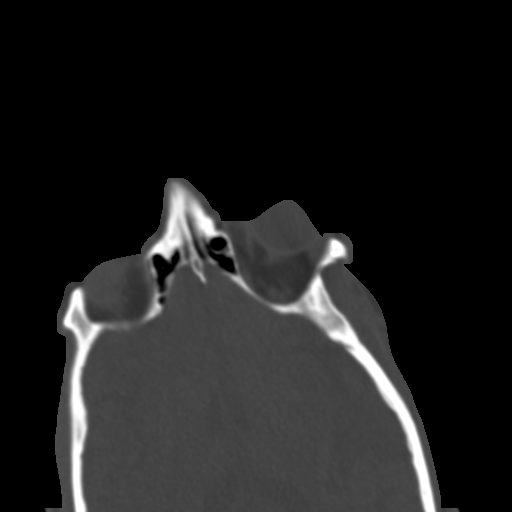
[im 77/87  bone]
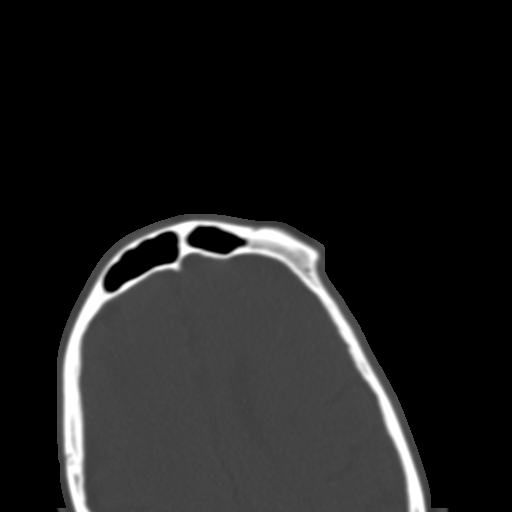

[Series 5: 1.0 thin soft tissue (person_name) · axial · 0.37mm/px · z∈[-167,-149]mm · 2 of 173 slices shown]
[im 19/173  brain]
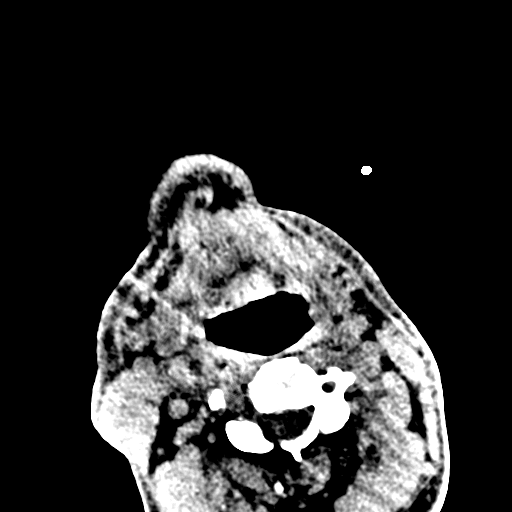
[im 37/173  brain]
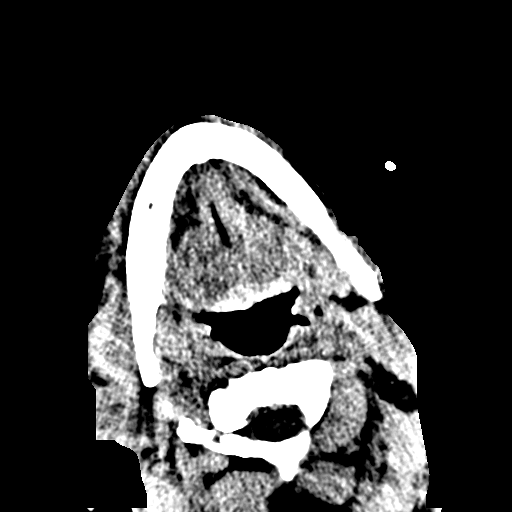

[Series 7: coronal soft tissue · coronal · 0.36mm/px · 3 of 76 slices shown]
[im 26/76  bone]
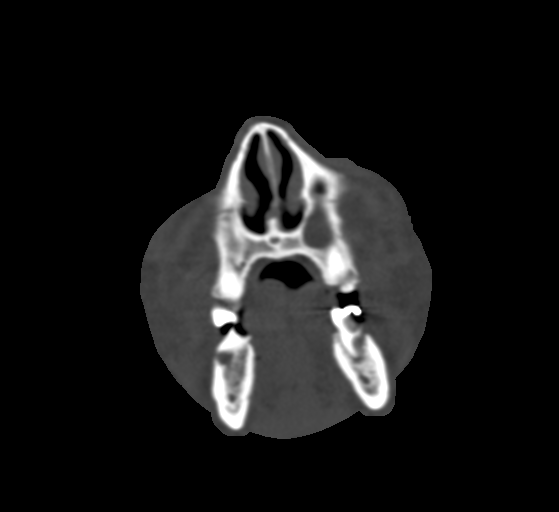
[im 34/76  bone]
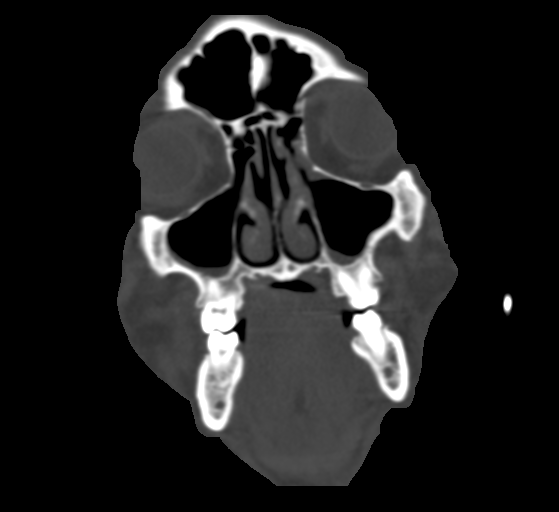
[im 42/76  bone]
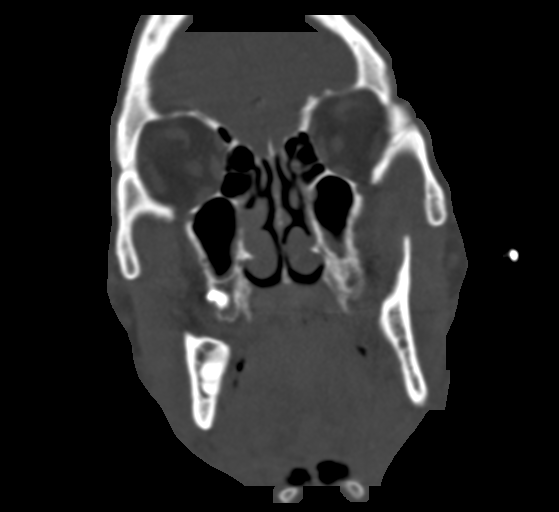

[Series 8: sagittal soft tissue · sagittal · 0.36mm/px · 3 of 86 slices shown]
[im 29/86  bone]
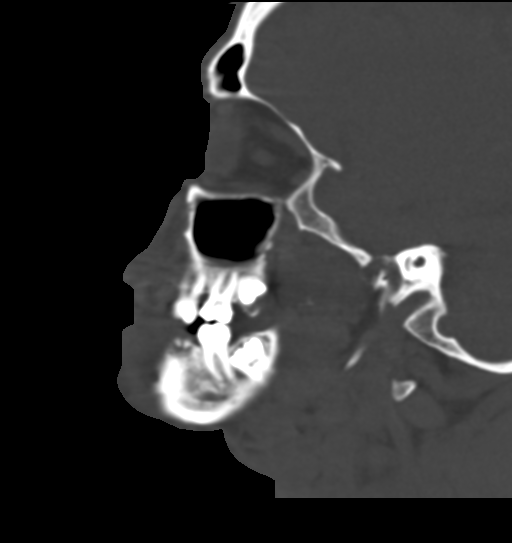
[im 43/86  bone]
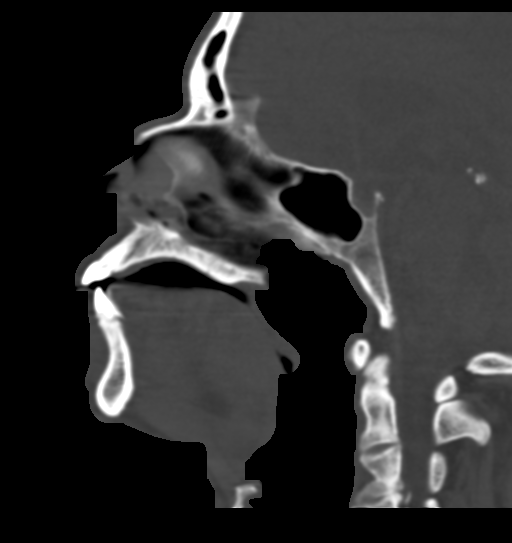
[im 57/86  bone]
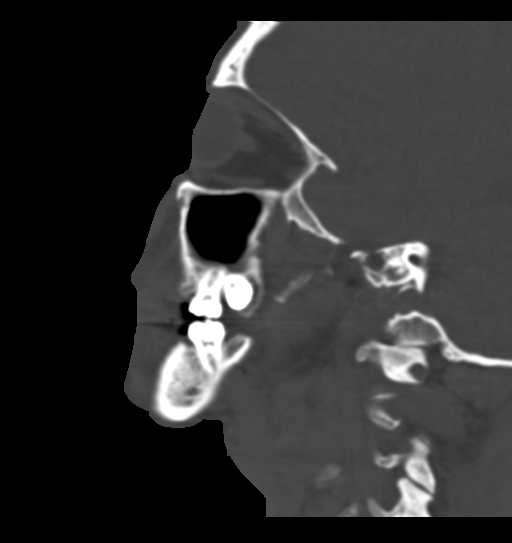

[16 of 47 positions shown; findings below may reference images not displayed]

FINDINGS: CT HEAD FINDINGS

Brain: Mild chronic ischemic white matter disease is noted. No mass
effect or midline shift is noted. Ventricular size is within normal
limits. There is no evidence of mass lesion, hemorrhage or acute
infarction.

Vascular: No hyperdense vessel or unexpected calcification.

Skull: Normal. Negative for fracture or focal lesion.

Other: None.

CT MAXILLOFACIAL FINDINGS

Osseous: No fracture or mandibular dislocation. No destructive
process.

Orbits: Negative. No traumatic or inflammatory finding.

Sinuses: Clear.

Soft tissues: Negative.

CT CERVICAL SPINE FINDINGS

Alignment: Grade 1 retrolisthesis of C3-4 and C4-5 is noted
secondary to moderate degenerative disc disease at these levels.

Skull base and vertebrae: No acute fracture. No primary bone lesion
or focal pathologic process.

Soft tissues and spinal canal: No prevertebral fluid or swelling. No
visible canal hematoma.

Disc levels: Moderate degenerative disc disease is noted at C3-4,
C4-5 and C6-7.

Upper chest: Negative.

Other: None.
IMPRESSION: No acute intracranial abnormality seen.

No definite abnormality seen in maxillofacial region.

Moderate multilevel degenerative disc disease is noted in the
cervical spine. No fracture or other acute abnormality is noted in
the cervical spine.

## 2021-10-11 IMAGING — CT CT ANGIO CHEST
2 of 6 series · 17 of 36 positions shown · IV contrast (agent unspecified)
Comparison: AP chest [DATE] [DATE], CT chest [DATE] CTA
chest [DATE]

CLINICAL DATA: Positive D-dimer. Pulmonary embolism suspected.
Altered mental status.

EXAM:
CT ANGIOGRAPHY CHEST WITH CONTRAST
TECHNIQUE: Multidetector CT imaging of the chest was performed using the
standard protocol during bolus administration of intravenous
contrast. Multiplanar CT image reconstructions and MIPs were
obtained to evaluate the vascular anatomy.

[Series 7: pe thins · axial · 0.63mm/px · z∈[-669,-346]mm · 16 of 513 slices shown]
[im 26/513  lung]
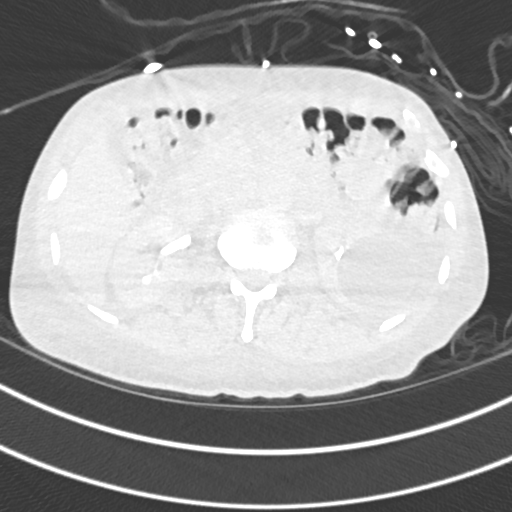
[im 52/513  mediastinal]
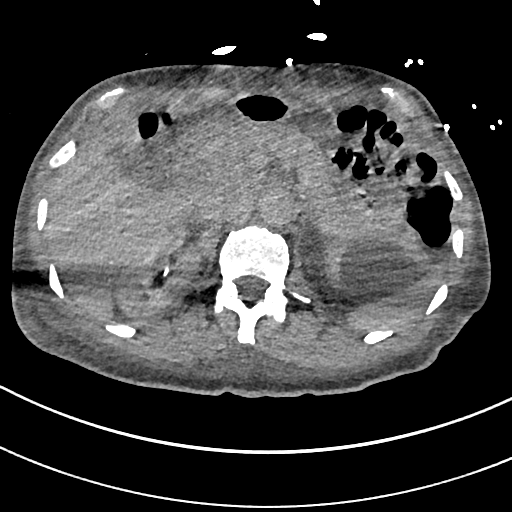
[im 77/513  lung]
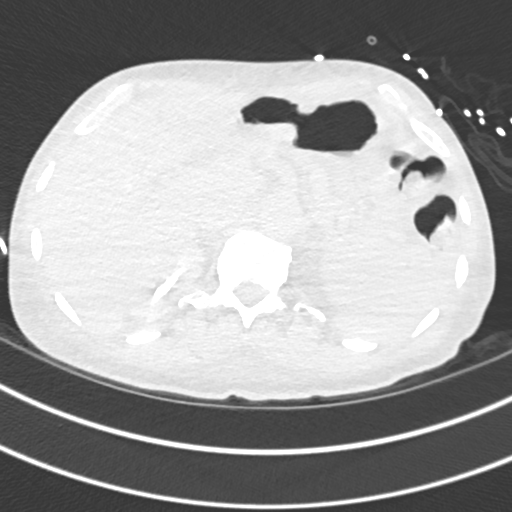
[im 129/513  mediastinal]
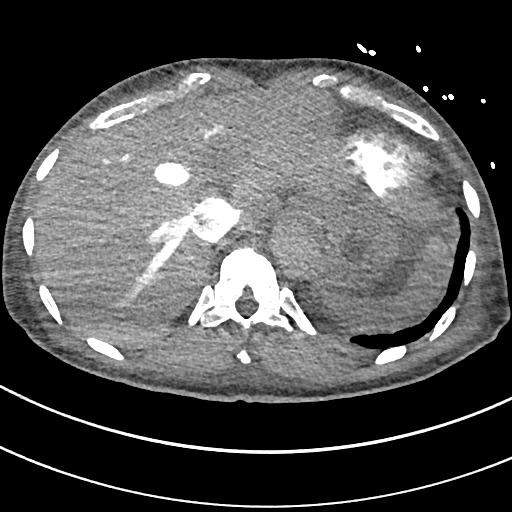
[im 154/513  lung]
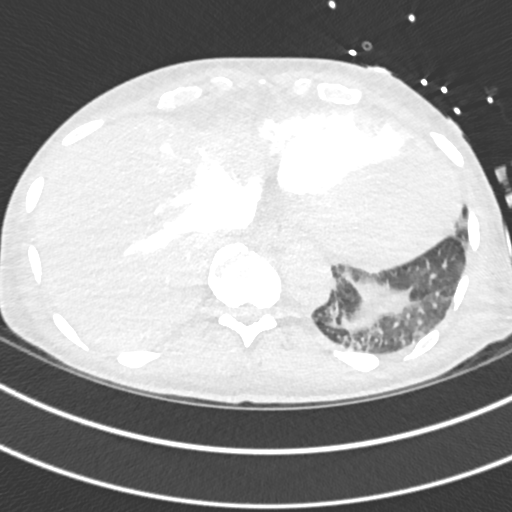
[im 180/513  mediastinal]
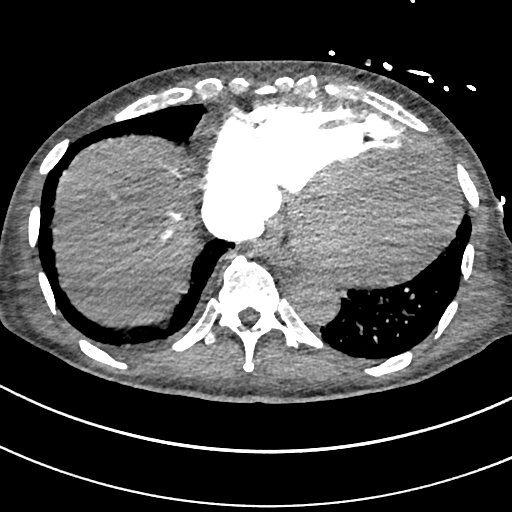
[im 205/513  lung]
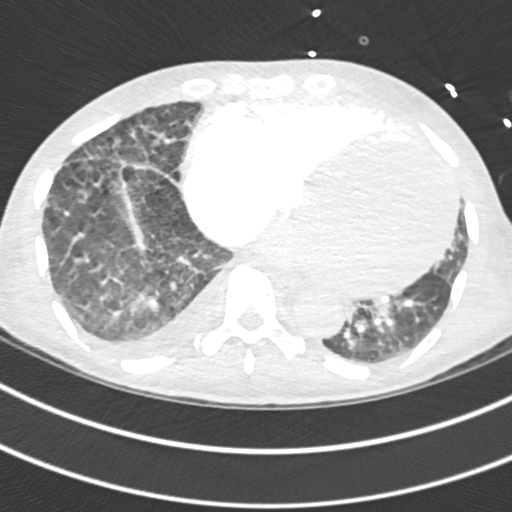
[im 231/513  mediastinal]
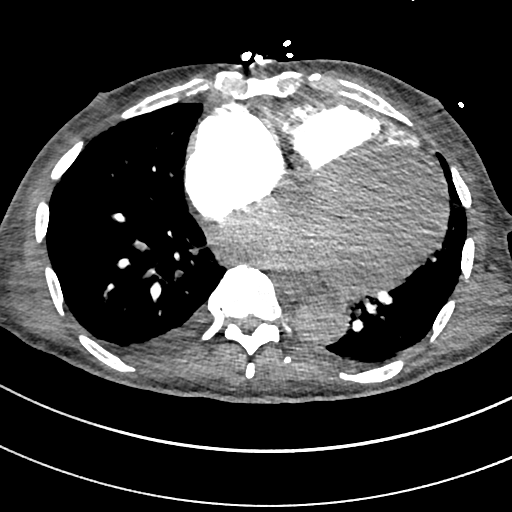
[im 282/513  lung]
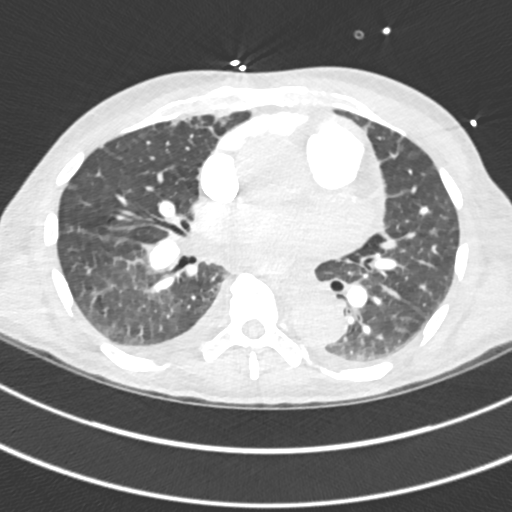
[im 308/513  mediastinal]
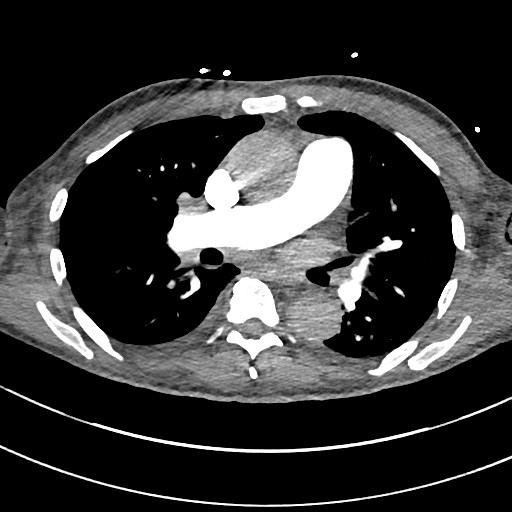
[im 333/513  lung]
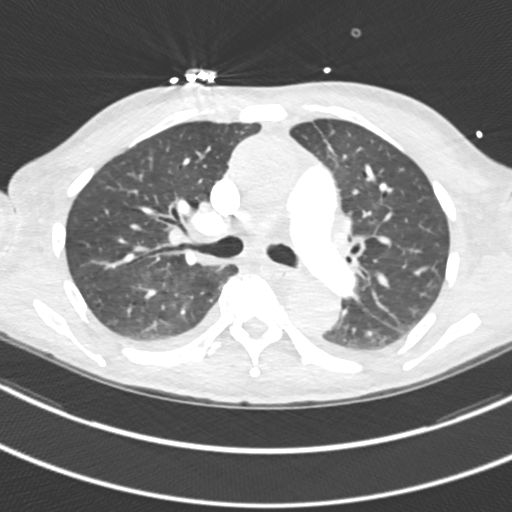
[im 359/513  mediastinal]
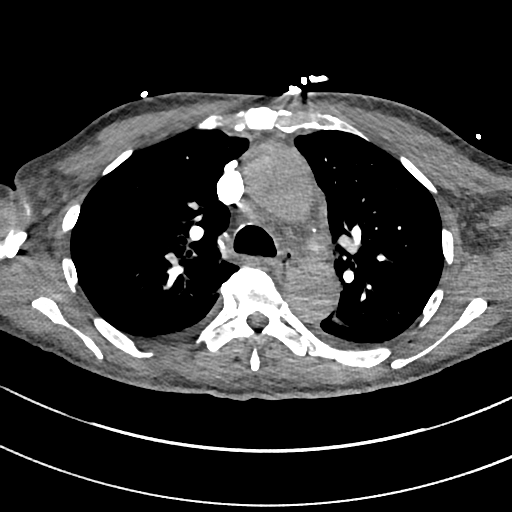
[im 385/513  lung]
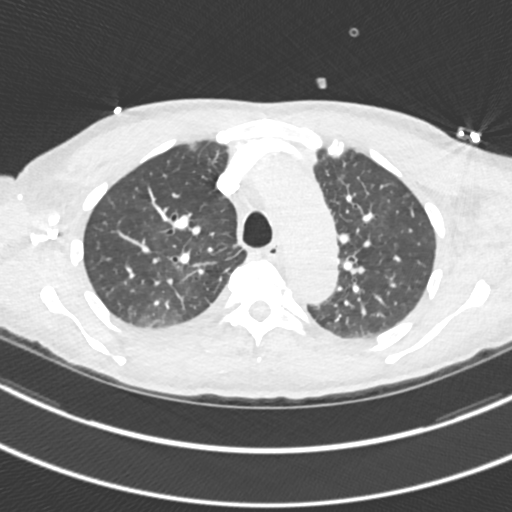
[im 436/513  mediastinal]
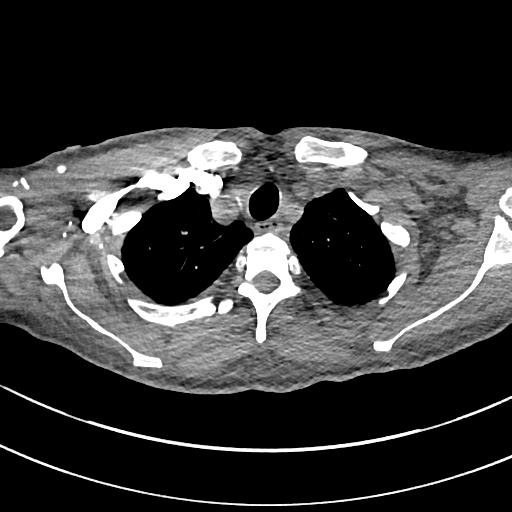
[im 461/513  lung]
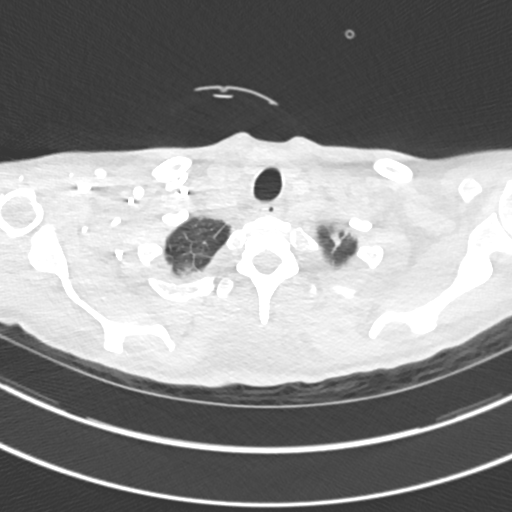
[im 487/513  mediastinal]
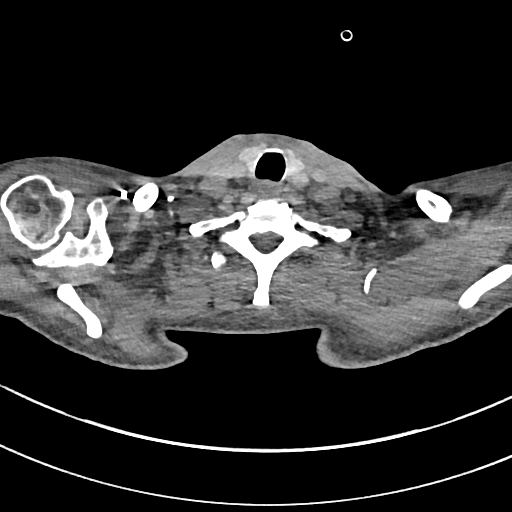

[Series 8: pe 2mm cor · coronal · 0.70mm/px · 1 of 151 slices shown]
[im 76/151  mediastinal]
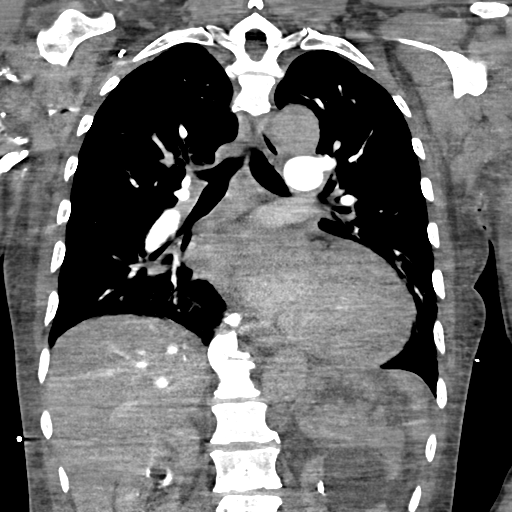

[17 of 36 positions shown; findings below may reference images not displayed]

RADIATION DOSE REDUCTION: This exam was performed according to the
departmental dose-optimization program which includes automated
exposure control, adjustment of the mA and/or kV according to
patient size and/or use of iterative reconstruction technique.

CONTRAST:  100mL OMNIPAQUE IOHEXOL 350 MG/ML SOLN
FINDINGS: Cardiovascular: The main pulmonary artery is opacified up to 447
Hounsfield units. There are small linear filling defects suspicious
for pulmonary emboli within the peripheral anterior aspect of the
distal right middle lobe pulmonary artery (axial series 5, image 81)
and the medial segment of the right lower lobe pulmonary artery
(axial series 5, image 88).

The ascending aorta measures up to 4.2 cm in caliber, mildly
aneurysmal. This is unchanged from prior. There is again a conjoined
origin of the right brachiocephalic and left common carotid artery
off the aortic arch, a normal variant. Additional normal-variant
direct takeoff of the left vertebral artery from the aortic arch is
again seen.

Heart size is markedly enlarged measuring up to approximately 18 cm
in transverse dimension. This may be slightly increased from 16 cm
on [DATE]. No signs of right-sided heart strain.

Mediastinum/Nodes: No axillary, mediastinal, or hilar pathologically
enlarged lymph nodes by CT criteria. The esophagus follows normal
course of normal caliber.

Lungs/Pleura: The central airways are patent. Mild bilateral upper
lung centrilobular emphysematous changes. There are scattered
bilateral ground-glass opacities, greatest within the lower lungs.
These are mildly increased compared to [DATE]. Small right
pleural effusion and tiny left pleural effusion, new from prior.
Chronic interlobular septal thickening within the
right-greater-than-left lung bases, unchanged.

Upper Abdomen: Limited arterial phase images of the upper abdomen
again demonstrate a partially visualized fluid density likely cyst
overlying the left kidney measuring up to 5.0 cm.

Musculoskeletal: Mild-to-moderate multilevel degenerative disc
changes of the thoracolumbar spine. There are flowing bridging
osteophytes again compatible with DISH.

Review of the MIP images confirms the above findings.
IMPRESSION: :
IMPRESSION: 1. Mild nonocclusive pulmonary emboli within the right middle lobe
pulmonary artery and right lower lobe medial segment pulmonary
artery.
2. Stable ascending aortic aneurysm measuring up to 4.2 cm.
3. Marked cardiomegaly, possibly mildly worsened from [DATE].
4. Small right and trace left pleural effusions. Scattered
ground-glass opacities compatible with pulmonary edema, subsegmental
atelectasis, and/or a mild infectious/inflammatory process.

Critical Value/emergent results IMPRESSION #1 was called by
telephone at the time of interpretation on [DATE] at [DATE] to
provider Dr. KLEVER, who verbally acknowledged these results.

## 2021-10-11 IMAGING — MR MR HEAD W/O CM
7 of 10 series · 39 of 48 positions shown · non-contrast
Comparison: Same-day CT/CTA head and neck

CLINICAL DATA: Delirium

EXAM:
MRI HEAD WITHOUT CONTRAST
TECHNIQUE: Multiplanar, multiecho pulse sequences of the brain and surrounding
structures were obtained without intravenous contrast.

[Series 3: DWI · axial · 3.0mm · 1.09mm/px · z∈[-66,+84]mm · 11 of 102 slices shown (1 of 4)]
[im 1/102]
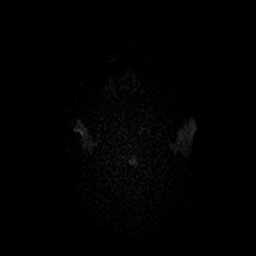
[im 11/102]
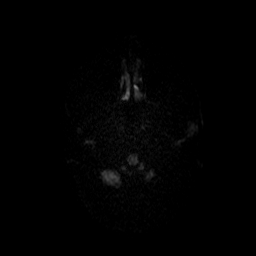
[im 21/102]
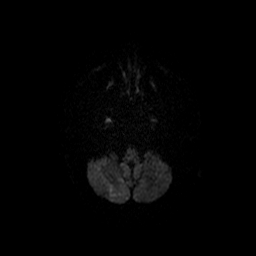
[im 31/102]
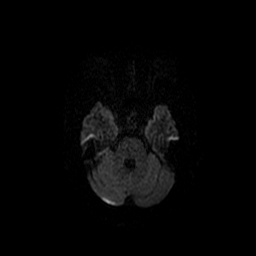
[im 41/102]
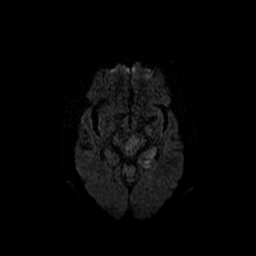
[im 51/102]
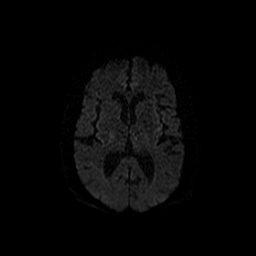
[im 61/102]
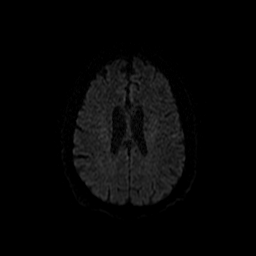
[im 71/102]
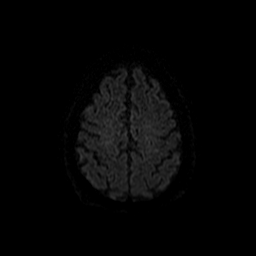
[im 81/102]
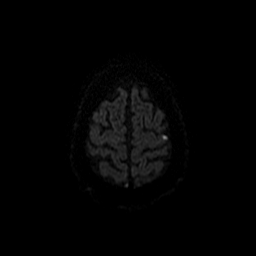
[im 91/102]
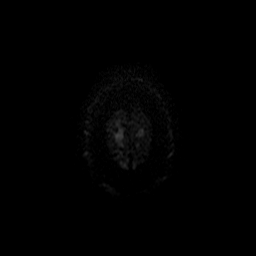
[im 102/102]
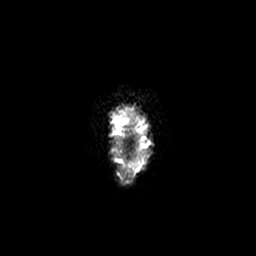

[Series 4: DWI · coronal · 5.0mm · 1.09mm/px · 8 of 73 slices shown (2 of 4)]
[im 1/73]
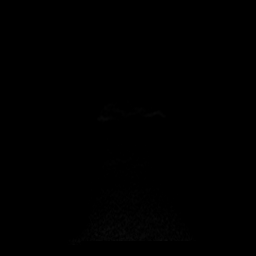
[im 11/73]
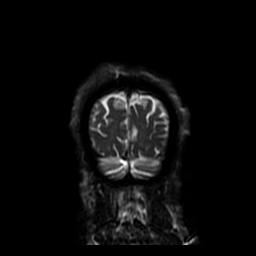
[im 21/73]
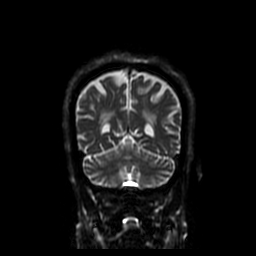
[im 31/73]
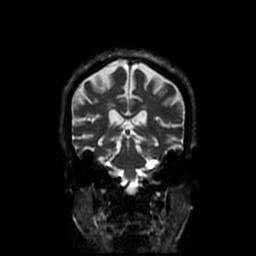
[im 42/73]
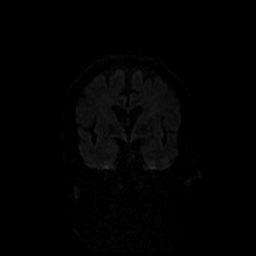
[im 52/73]
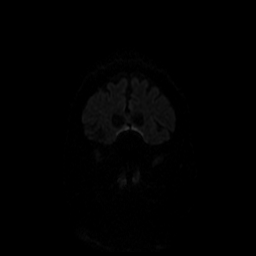
[im 62/73]
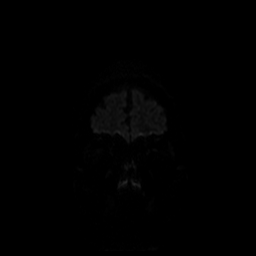
[im 73/73]
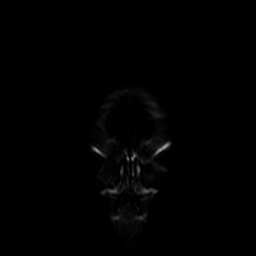

[Series 8: FLAIR · axial · 5.0mm · 0.43mm/px · z∈[-71,+77]mm · 3 of 26 slices shown]
[im 1/26]
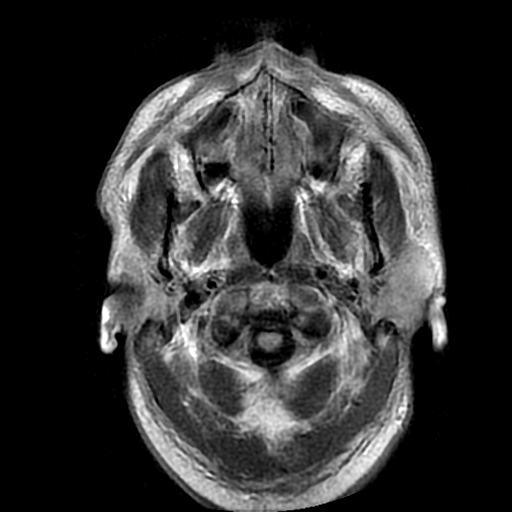
[im 13/26]
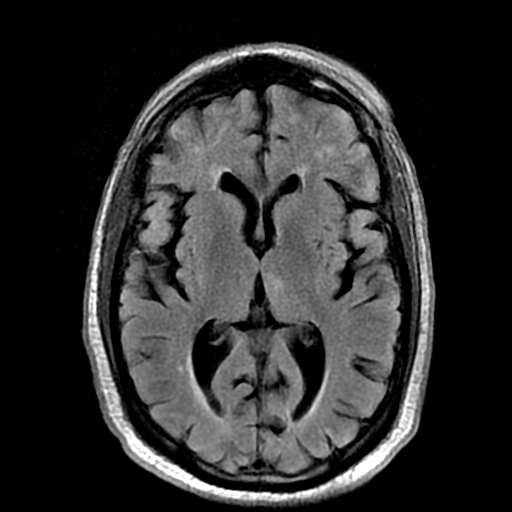
[im 26/26]
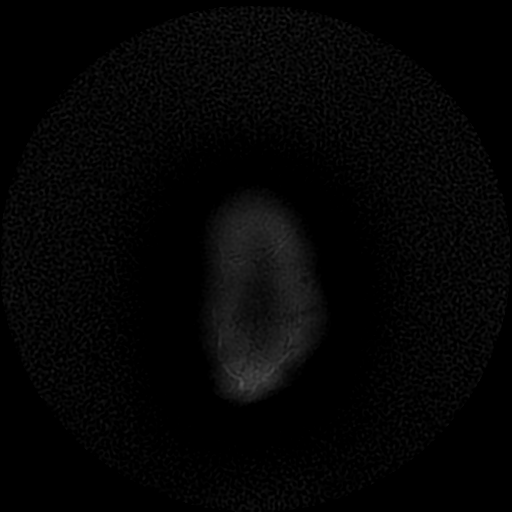

[Series 11: T2 · coronal · 5.0mm · 0.43mm/px · 4 of 31 slices shown (1 of 2)]
[im 1/31]
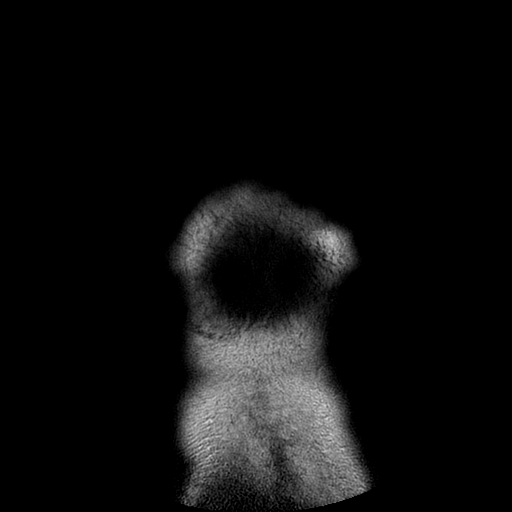
[im 11/31]
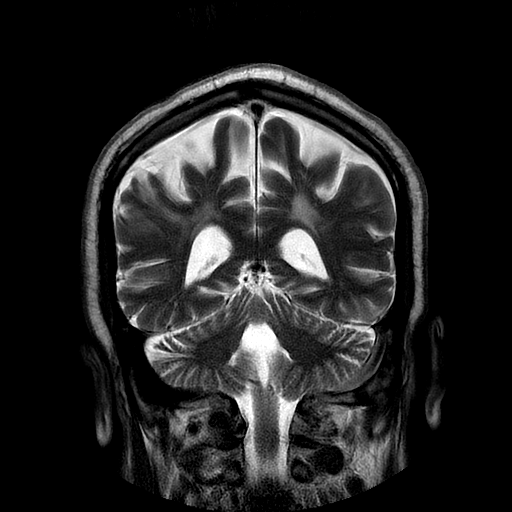
[im 21/31]
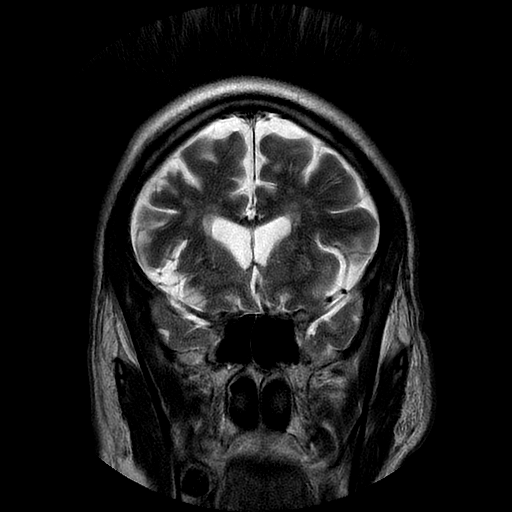
[im 31/31]
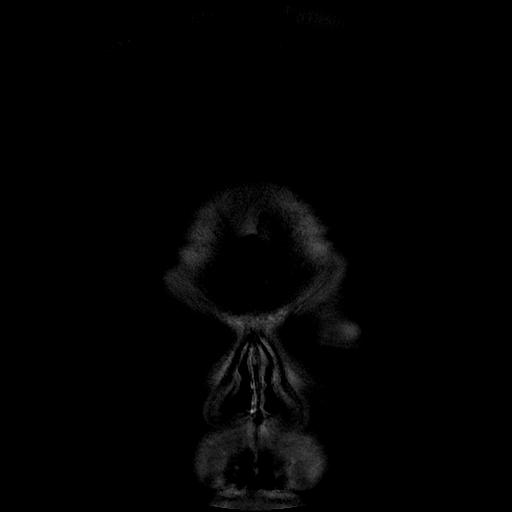

[Series 12: T2 · axial · 5.0mm · 0.43mm/px · z∈[-71,+77]mm · 3 of 26 slices shown (2 of 2)]
[im 1/26]
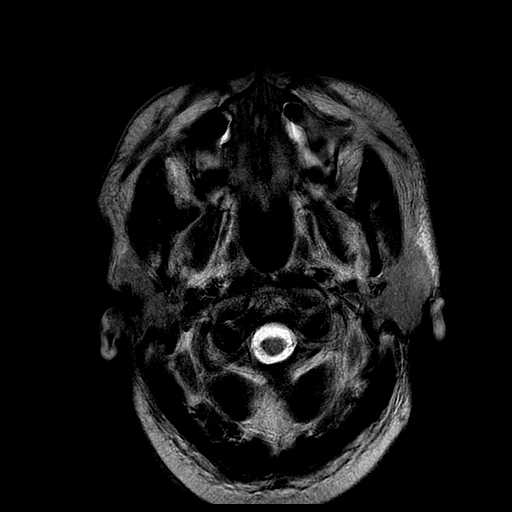
[im 13/26]
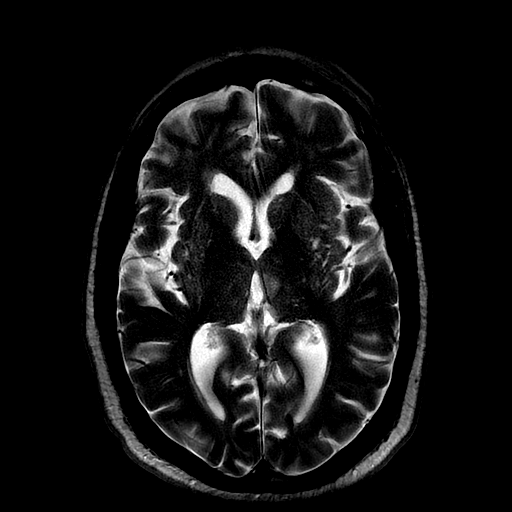
[im 26/26]
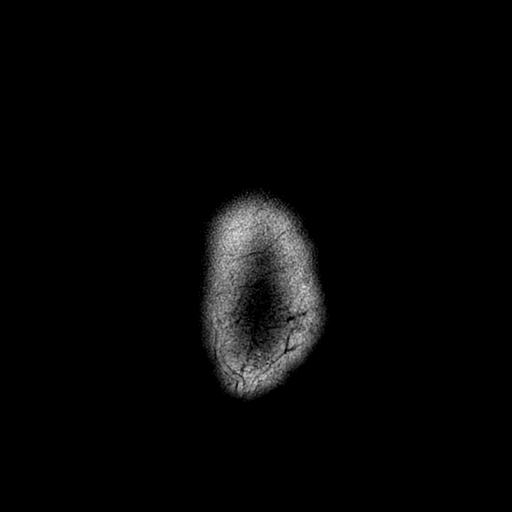

[Series 300: DWI · axial · 3.0mm · 1.09mm/px · z∈[-66,+84]mm · 6 of 51 slices shown (3 of 4)]
[im 1/51]
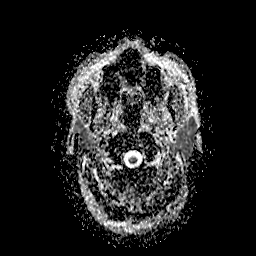
[im 11/51]
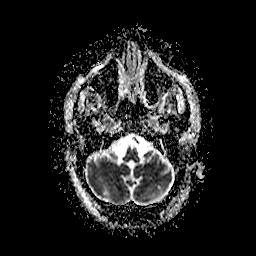
[im 21/51]
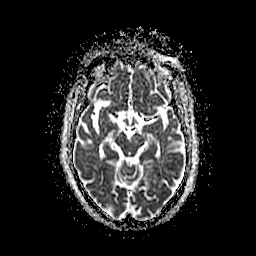
[im 31/51]
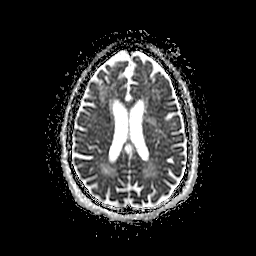
[im 41/51]
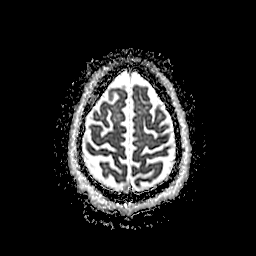
[im 51/51]
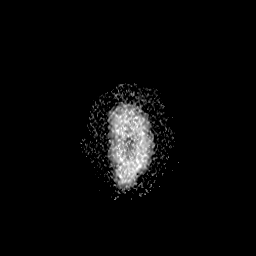

[Series 400: DWI · coronal · 5.0mm · 1.09mm/px · 4 of 37 slices shown (4 of 4)]
[im 1/37]
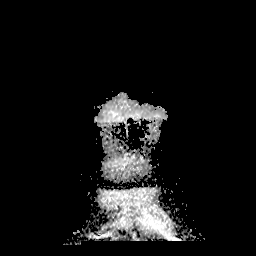
[im 13/37]
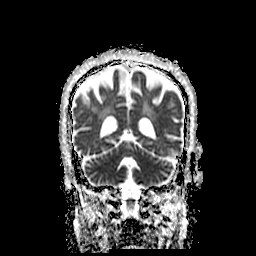
[im 25/37]
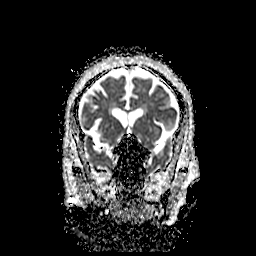
[im 37/37]
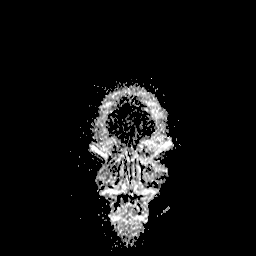

[39 of 48 positions shown; findings below may reference images not displayed]

FINDINGS: Brain: There is patchy faint diffusion restriction in the right
cerebellar hemisphere with faint T2/FLAIR signal abnormality
consistent with acute subacute to infarct. Radiograph there is faint
diffusion restriction in the left cerebral peduncle, left
hippocampus, and medial right occipital lobe favored to reflect
subacute infarcts. There is no associated hemorrhage or mass effect.

Small more acute appearing infarcts are seen in the left pre and
postcentral gyri. There is no associated hemorrhage or mass effect.

Background parenchymal volume is normal. The ventricles are stable
in size. Patchy and confluent FLAIR signal abnormality throughout
the remainder of the subcortical and periventricular white matter is
nonspecific but likely reflects sequela of moderate chronic white
matter microangiopathy. Remote lacunar infarcts are seen in the left
cerebellar hemisphere.

There is no mass lesion.  There is no midline shift

Vascular: The major flow voids are present. The vasculature is
assessed in full on the same day CTA head and neck.

Skull and upper cervical spine: Normal marrow signal.

Sinuses/Orbits: There is mild mucosal thickening in the paranasal
sinuses. Globes and orbits are unremarkable.

Other: None.
IMPRESSION: 1. Scattered infarcts involving the right cerebellar hemisphere,
left cerebral peduncle, left hippocampus, left thalamus, right
occipital lobe, and left pre and postcentral gyri, some of which
appear acute while others appear more subacute in chronicity.
Consider embolic etiology given involvement of multiple vascular
territories.
2. Background moderate chronic white matter microangiopathy.

## 2021-10-11 IMAGING — DX DG CHEST 1V PORT
1 series · 1 of 1 positions shown · non-contrast
Comparison: [DATE]

CLINICAL DATA: Found down, difficulty breathing

EXAM:
PORTABLE CHEST 1 VIEW

[chest]
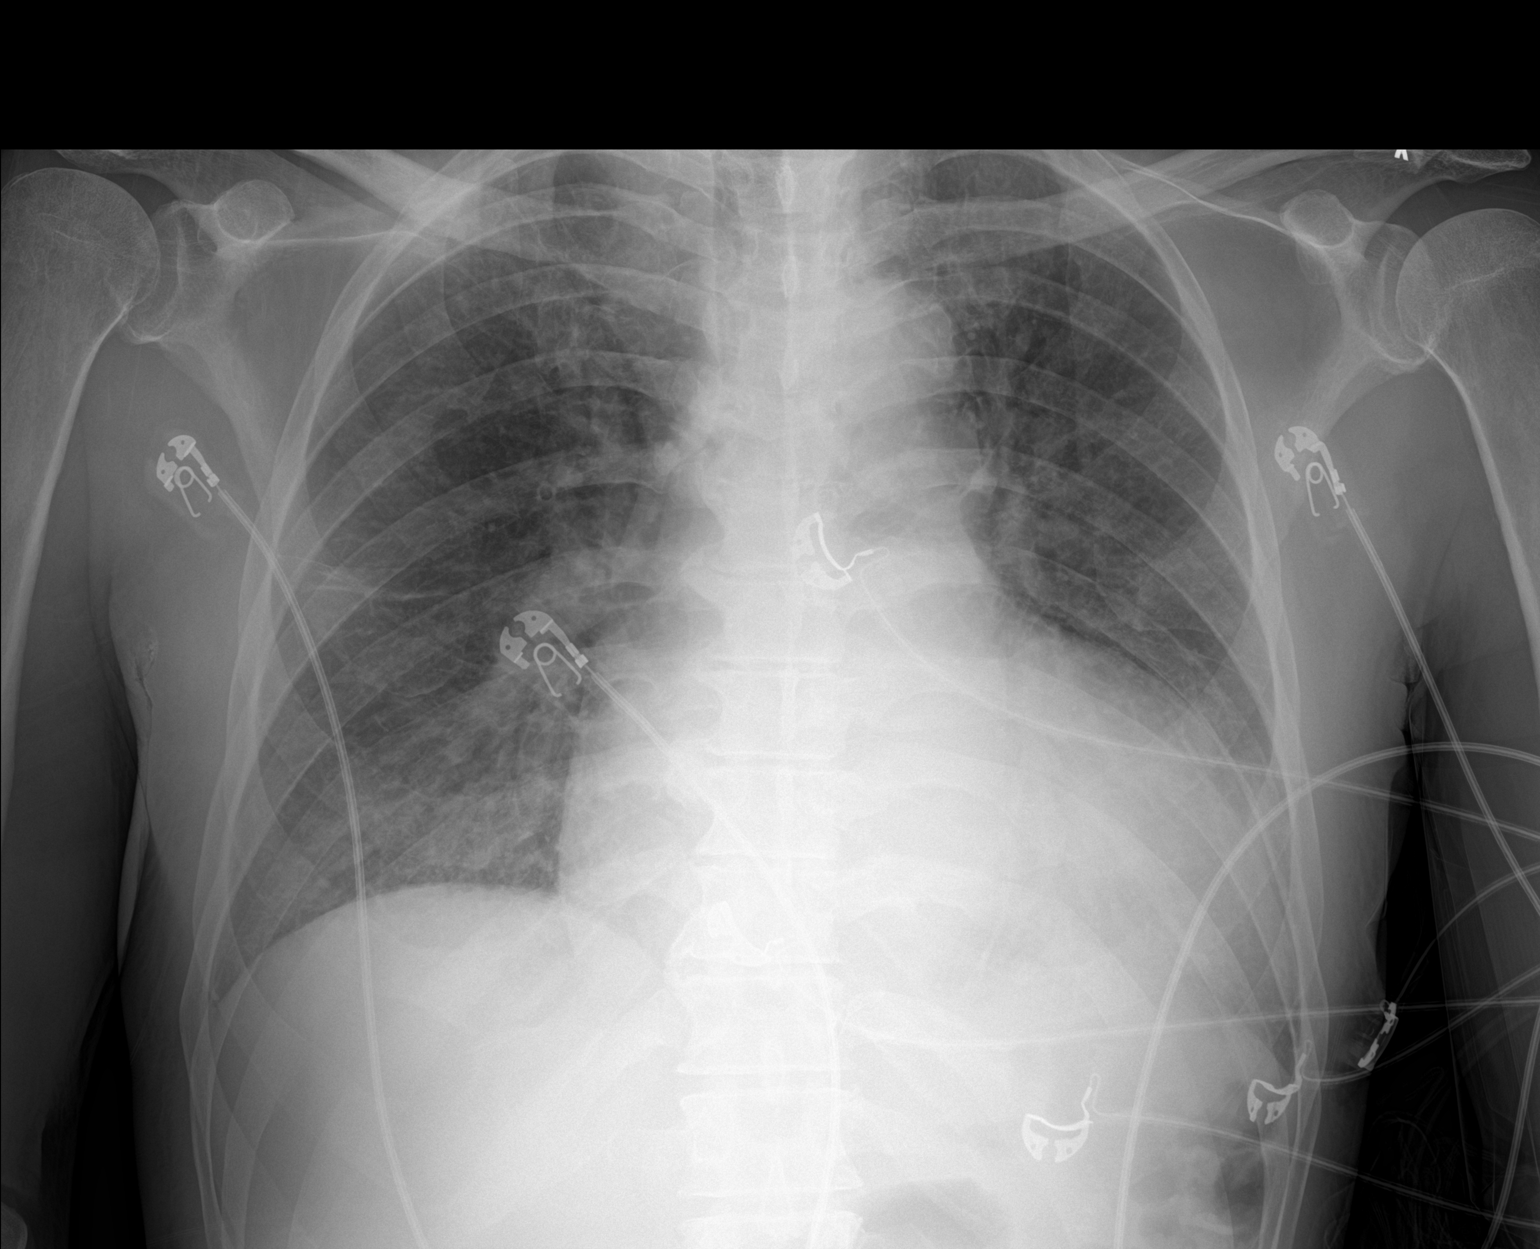

[1 of 1 positions shown; findings below may reference images not displayed]

FINDINGS: Transverse diameter of heart is increased. Central pulmonary vessels
are more prominent. Increased interstitial and alveolar markings are
seen in the parahilar regions and lower lung fields on both sides.
Lateral CP angles are clear. There is no pneumothorax.
IMPRESSION: Cardiomegaly. There is pulmonary vascular congestion and pulmonary
edema in both lungs. Possibility of underlying pneumonia is not
excluded.

## 2021-10-11 IMAGING — CT CT CERVICAL SPINE W/O CM
3 of 4 series · 13 of 35 positions shown, 16 images · non-contrast
Comparison: [DATE].

CLINICAL DATA: Trauma.



[Series 5: c_spine 2.0 3 (person_name) (person_name) · axial · 0.42mm/px · z∈[-250,-122]mm · 5 of 97 slices shown, 7 images]
[im 17/97  soft-tissue]
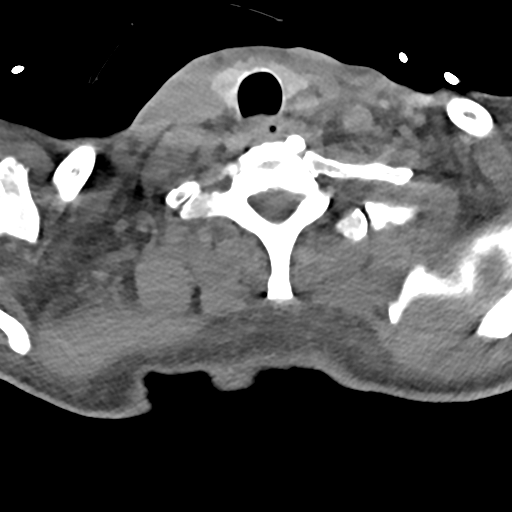
[im 17/97  bone]
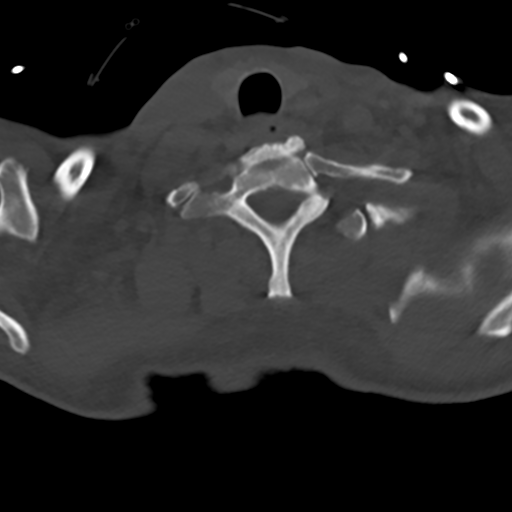
[im 33/97  bone]
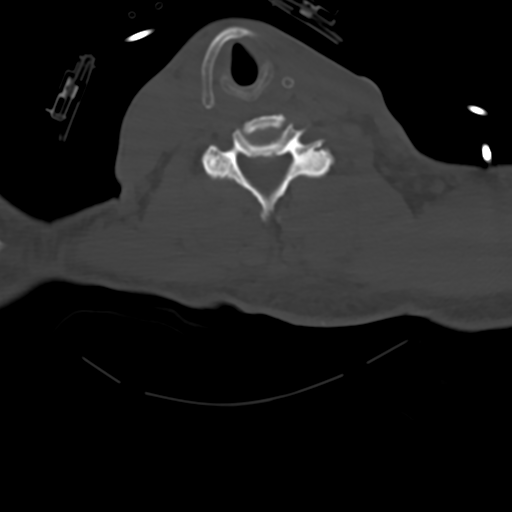
[im 49/97  bone]
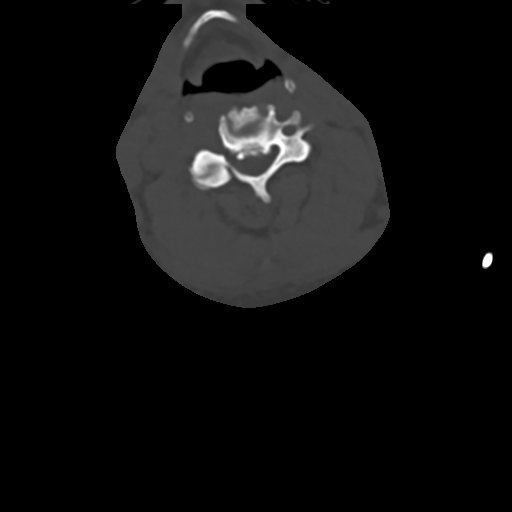
[im 65/97  bone]
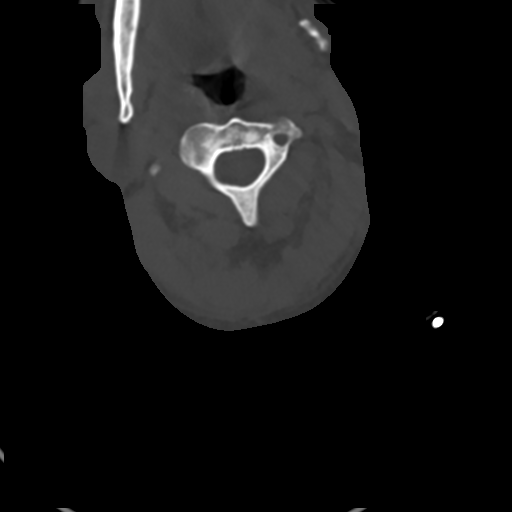
[im 81/97  soft-tissue]
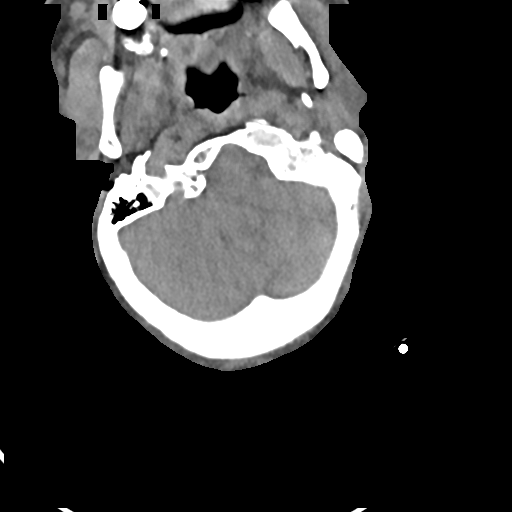
[im 81/97  bone]
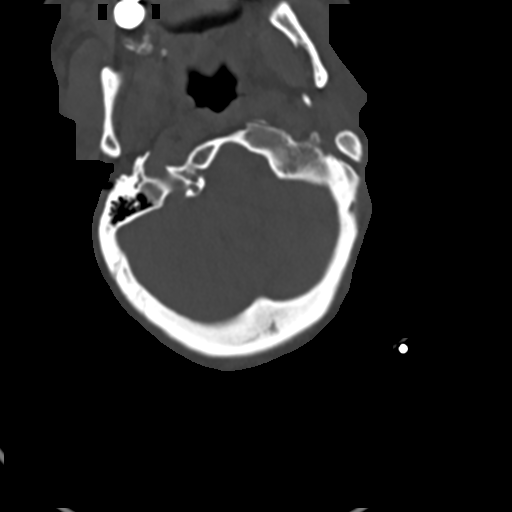

[Series 6: coronal bone · coronal · 0.31mm/px · 3 of 66 slices shown]
[im 14/66  bone]
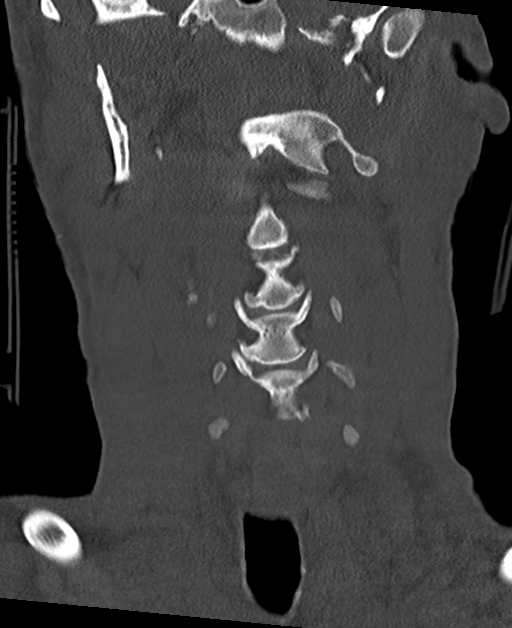
[im 27/66  bone]
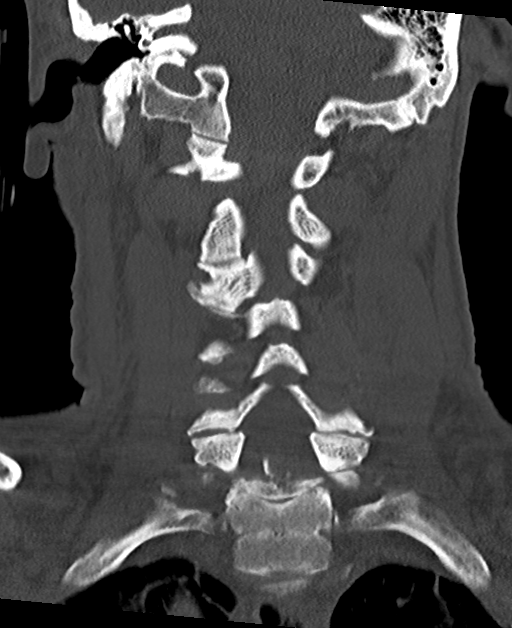
[im 40/66  bone]
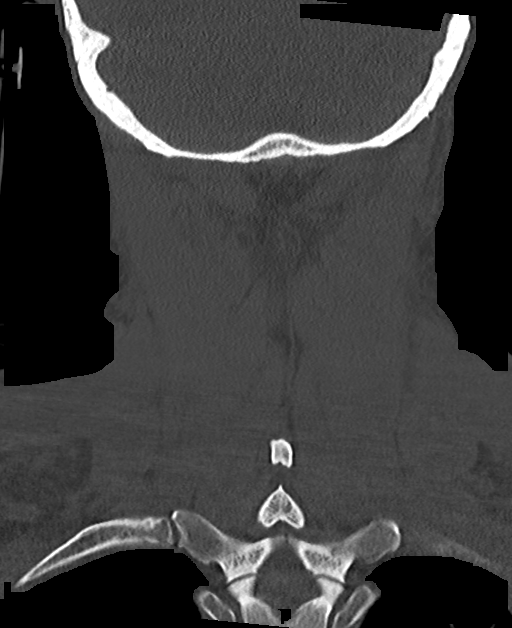

[Series 7: sagittal bone · sagittal · 0.28mm/px · 5 of 61 slices shown, 6 images]
[im 21/61  bone]
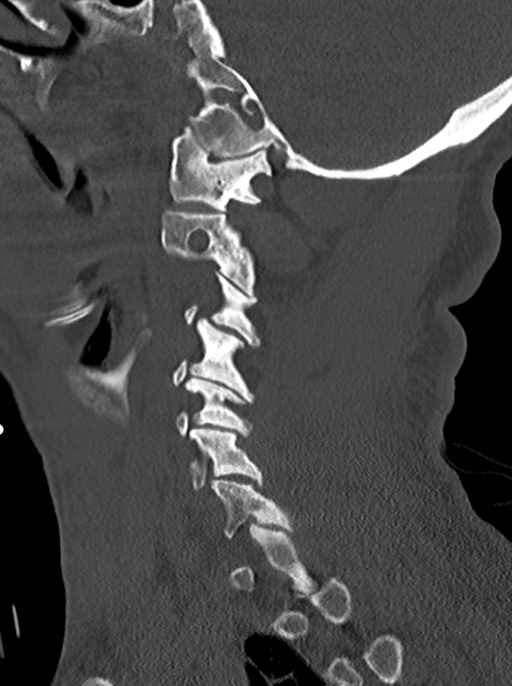
[im 26/61  bone]
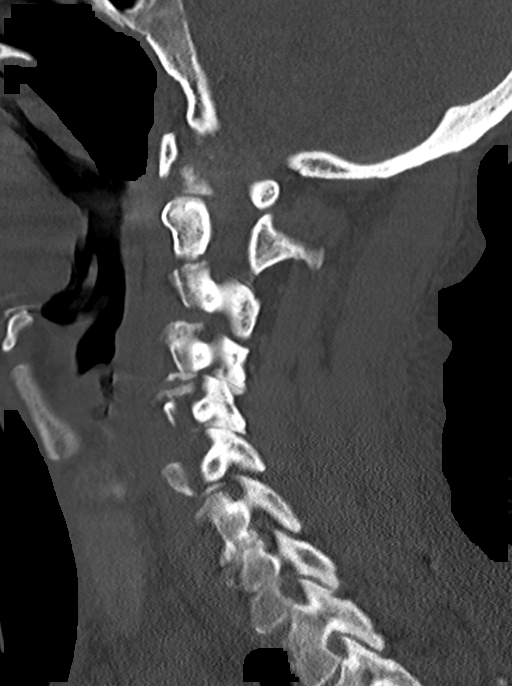
[im 31/61  soft-tissue]
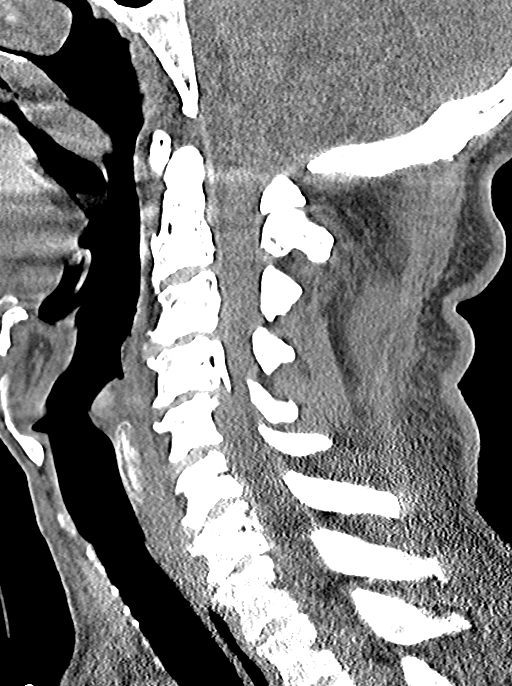
[im 31/61  bone]
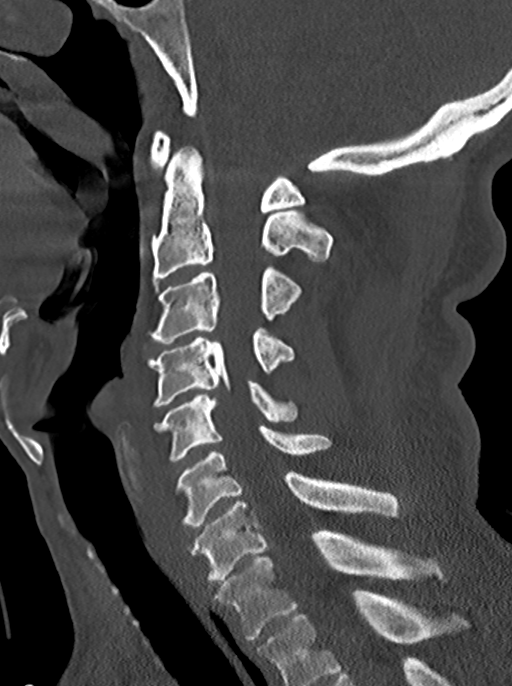
[im 36/61  bone]
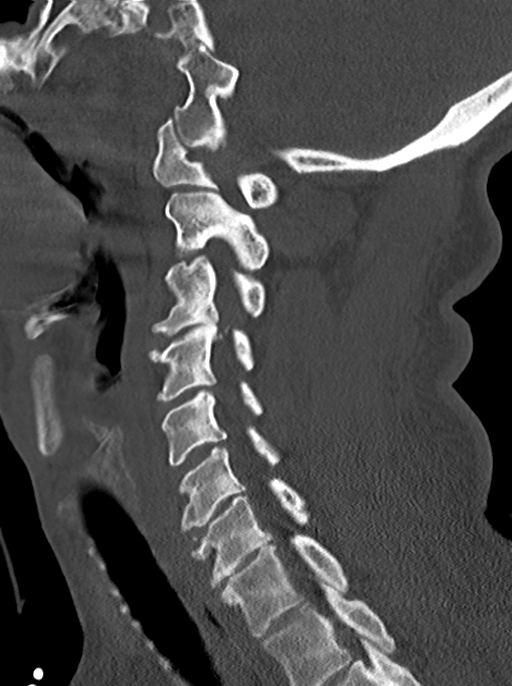
[im 41/61  bone]
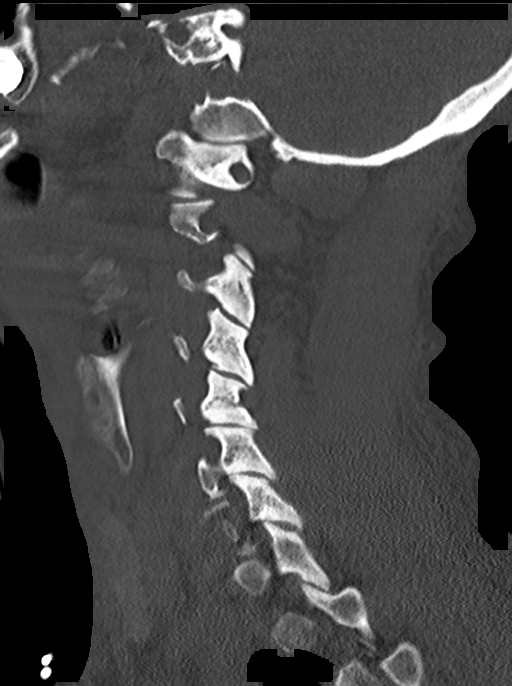

[13 of 35 positions shown; findings below may reference images not displayed]

FINDINGS: CT HEAD FINDINGS

Brain: Mild chronic ischemic white matter disease is noted. No mass
effect or midline shift is noted. Ventricular size is within normal
limits. There is no evidence of mass lesion, hemorrhage or acute
infarction.

Vascular: No hyperdense vessel or unexpected calcification.

Skull: Normal. Negative for fracture or focal lesion.

Other: None.

CT MAXILLOFACIAL FINDINGS

Osseous: No fracture or mandibular dislocation. No destructive
process.

Orbits: Negative. No traumatic or inflammatory finding.

Sinuses: Clear.

Soft tissues: Negative.

CT CERVICAL SPINE FINDINGS

Alignment: Grade 1 retrolisthesis of C3-4 and C4-5 is noted
secondary to moderate degenerative disc disease at these levels.

Skull base and vertebrae: No acute fracture. No primary bone lesion
or focal pathologic process.

Soft tissues and spinal canal: No prevertebral fluid or swelling. No
visible canal hematoma.

Disc levels: Moderate degenerative disc disease is noted at C3-4,
C4-5 and C6-7.

Upper chest: Negative.

Other: None.
IMPRESSION: No acute intracranial abnormality seen.

No definite abnormality seen in maxillofacial region.

Moderate multilevel degenerative disc disease is noted in the
cervical spine. No fracture or other acute abnormality is noted in
the cervical spine.

## 2021-10-11 IMAGING — DX DG PORTABLE PELVIS
1 series · 1 of 1 positions shown · non-contrast
Comparison: None.

CLINICAL DATA: Found down

EXAM:
PORTABLE PELVIS 1-2 VIEWS

[pelvis]
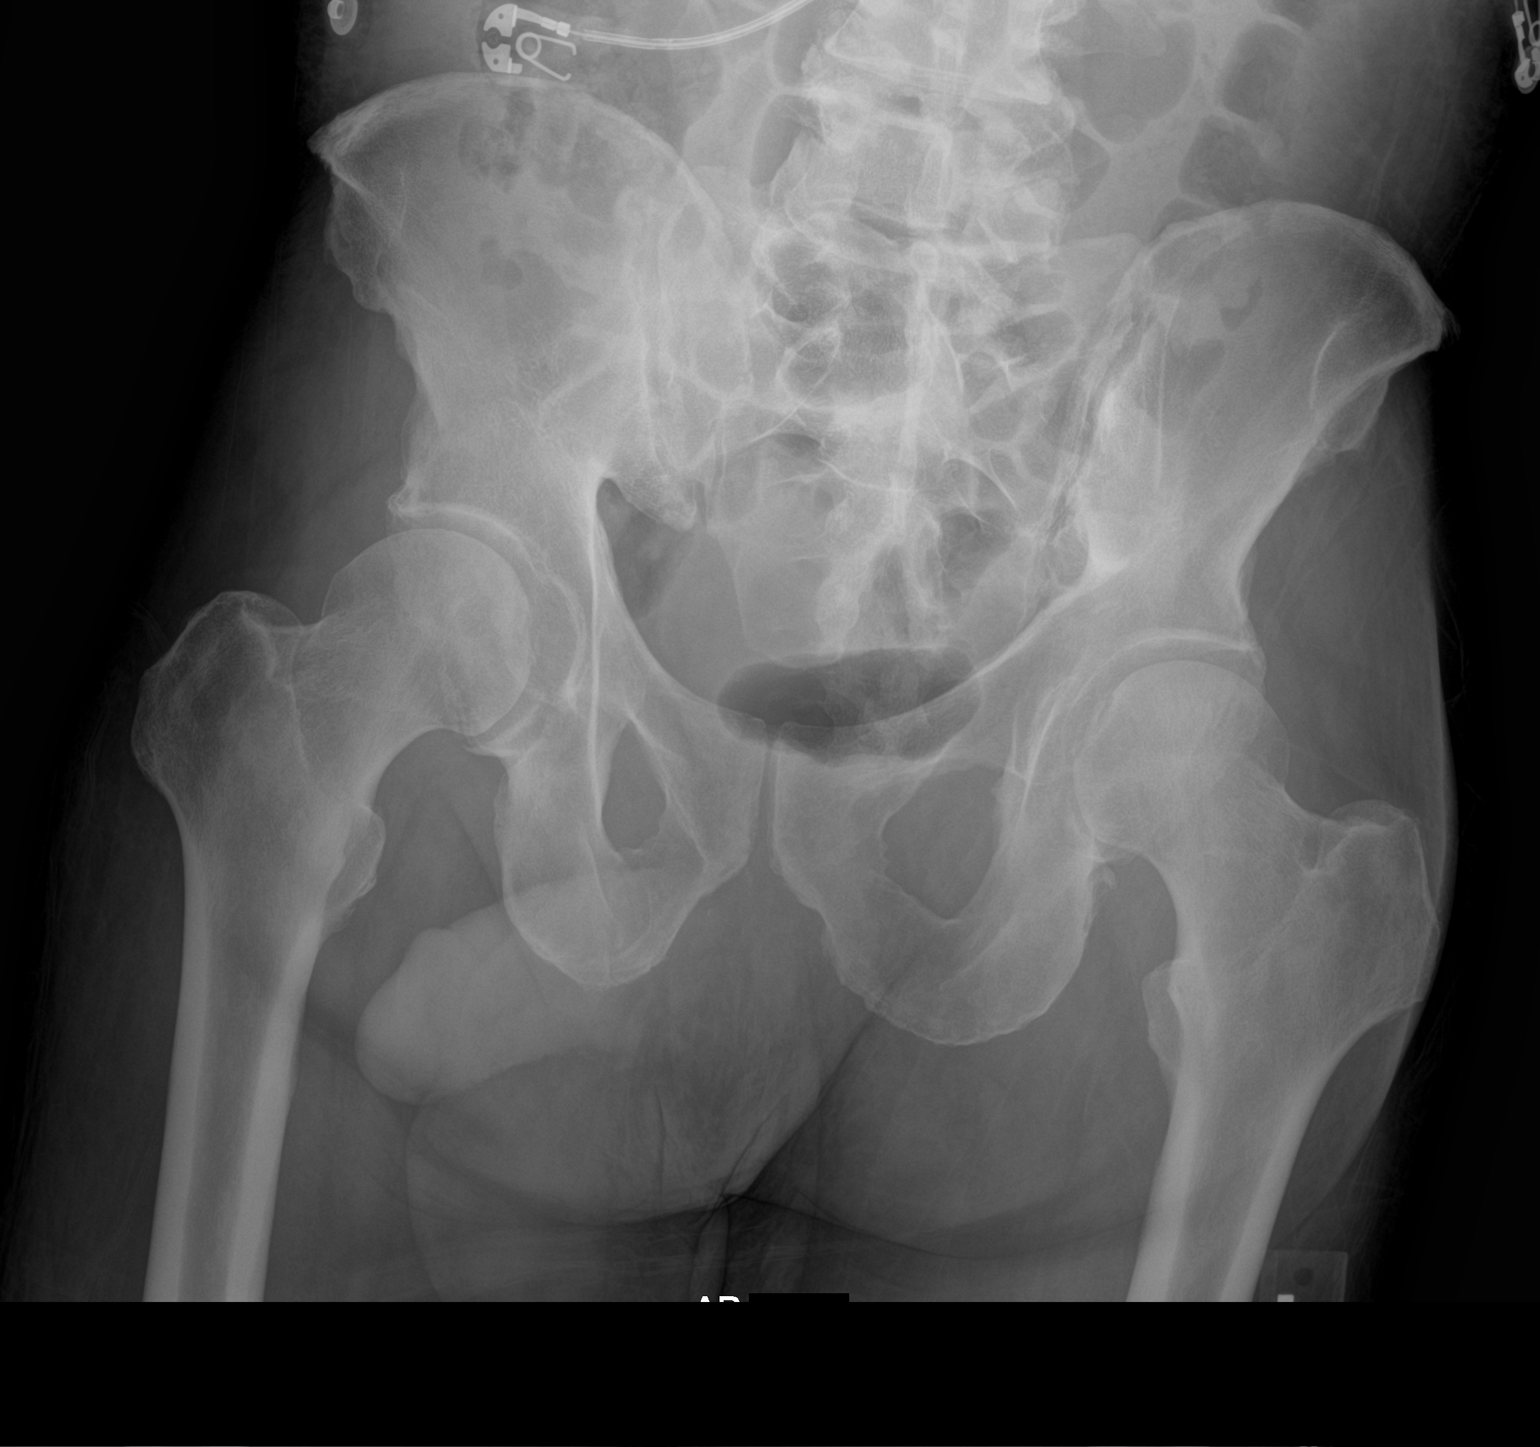

[1 of 1 positions shown; findings below may reference images not displayed]

FINDINGS: No recent fracture or dislocation is seen. Degenerative changes are
noted in the visualized lower lumbar spine.
IMPRESSION: No displaced fracture or dislocation is seen in the pelvis.

## 2021-10-11 MED ORDER — SODIUM CHLORIDE 0.9 % IV BOLUS: 1000.0000 mL | Freq: Once | INTRAVENOUS | Status: DC

## 2021-10-11 MED ORDER — FUROSEMIDE 10 MG/ML IJ SOLN
40.0000 mg | Freq: Two times a day (BID) | INTRAMUSCULAR | Status: DC
  Administered 2021-10-12: 40 mg via INTRAVENOUS
  Filled 2021-10-11: qty 4

## 2021-10-11 MED ORDER — IOHEXOL 350 MG/ML SOLN
100.0000 mL | Freq: Once | INTRAVENOUS | Status: AC | PRN
  Administered 2021-10-11: 100 mL via INTRAVENOUS

## 2021-10-11 MED ORDER — ACETAMINOPHEN 650 MG RE SUPP: 650.0000 mg | RECTAL | Status: DC | PRN

## 2021-10-11 MED ORDER — STROKE: EARLY STAGES OF RECOVERY BOOK
Freq: Once | Status: AC
  Filled 2021-10-11: qty 1

## 2021-10-11 MED ORDER — HEPARIN BOLUS VIA INFUSION
4600.0000 [IU] | Freq: Once | INTRAVENOUS | Status: AC
  Administered 2021-10-11: 4600 [IU] via INTRAVENOUS
  Filled 2021-10-11: qty 4600

## 2021-10-11 MED ORDER — HEPARIN (PORCINE) 25000 UT/250ML-% IV SOLN
1450.0000 [IU]/h | INTRAVENOUS | Status: DC
  Administered 2021-10-11: 1300 [IU]/h via INTRAVENOUS
  Administered 2021-10-12 – 2021-10-13 (×2): 1100 [IU]/h via INTRAVENOUS
  Administered 2021-10-15 – 2021-10-18 (×4): 1200 [IU]/h via INTRAVENOUS
  Administered 2021-10-19: 1300 [IU]/h via INTRAVENOUS
  Filled 2021-10-11 (×11): qty 250

## 2021-10-11 MED ORDER — ACETAMINOPHEN 325 MG PO TABS
650.0000 mg | ORAL_TABLET | ORAL | Status: DC | PRN
  Administered 2021-10-13 – 2021-10-18 (×6): 650 mg via ORAL
  Filled 2021-10-11 (×7): qty 2

## 2021-10-11 MED ORDER — FUROSEMIDE 10 MG/ML IJ SOLN
80.0000 mg | Freq: Once | INTRAMUSCULAR | Status: AC
  Administered 2021-10-11: 80 mg via INTRAVENOUS
  Filled 2021-10-11: qty 8

## 2021-10-11 MED ORDER — SODIUM CHLORIDE 0.9 % IV BOLUS
1000.0000 mL | Freq: Once | INTRAVENOUS | Status: AC
  Administered 2021-10-11: 1000 mL via INTRAVENOUS

## 2021-10-11 MED ORDER — SENNOSIDES-DOCUSATE SODIUM 8.6-50 MG PO TABS: 1.0000 | ORAL_TABLET | Freq: Every evening | ORAL | Status: DC | PRN

## 2021-10-11 MED ORDER — POTASSIUM CHLORIDE CRYS ER 20 MEQ PO TBCR
40.0000 meq | EXTENDED_RELEASE_TABLET | Freq: Two times a day (BID) | ORAL | Status: AC
  Administered 2021-10-11 – 2021-10-12 (×2): 40 meq via ORAL
  Filled 2021-10-11 (×2): qty 2

## 2021-10-11 MED ORDER — ACETAMINOPHEN 160 MG/5ML PO SOLN: 650.0000 mg | ORAL | Status: DC | PRN

## 2021-10-11 NOTE — ED Provider Notes (Signed)
Signed out to admit patient after imaging studies done.   CTA noted to be positive for PE.   MRI noted to have multiple cerebral/cerebellar infarcts. ?source.   Heparin per pharmacy.   Recheck pt, awake and alert, denies pain or other acute c/o. Denies sob or cp.   Medicine consulted for admission. Will likely need echo/other imaging during admission. Neurology following.      Lajean Saver, MD 10/11/21 [phone]

## 2021-10-11 NOTE — Progress Notes (Signed)
°   10/11/21 1226  Clinical Encounter Type  Visited With Patient not available  Visit Type Trauma  Referral From Nurse  Consult/Referral To Chaplain   Chaplain Jorene Guest responded to the page. The medical team is attending to the patient. There is no support person here at this time. Chaplain Gaynelle Arabian advised that the Chaplain remains available for follow-up spiritual and emotional support as needed.This note was prepared by Jeanine Luz, M.Div..  For questions please contact by phone [phone].

## 2021-10-11 NOTE — Hospital Course (Addendum)
Multifocal ischemic infarcts  Suspecting embolic in nature given multiple scattered infarcts Multiple scattered acute to subacute infarcts involving the right cerebellar hemisphere, left cerebral peduncle, left hippocampus, left thalamus, right occipital lobe, and left pre and postcentral gyri suggesting embolic etiology, in the setting of pulmonary emboli and no large vessel disease. Increased suspicion for paradoxical emboli/shunt. Deficits include right upper extremity weakness, right facial droop, and word finding difficulty. Has sluggish pupillary reflexes with constricted left pupil. Largely unchanged exam over next several days. Allowed for permissive hypertension.  Workup includes negative echo, DVT US, no afib on telemetry, negative TCD bubble study.  Stroke team re-consulted 10/17/21 d/t concerns for concerns of change in mental status, twitching, and changes in breathing pattern. Concern for hemorrhagic conversion vs seizure given new twitching and lethargy with re-imaging largely reassuring, showing evolution of prior infarcts but no large territory infarct or hemorrhagic conversion of prior infarcts. EEG negative, and thyroid studies not impressive. Suspect multifactorial encephalopathy related to respiratory and cardiac failure. Optimized BP control by up-titrating Hydral and then starting Bidil, and then adding Arlyce Harman.   After clinical stability, TEE performed. TEE revealing no evidence of PFO or shunting. His EF on TEE with 10%, and suspect this may have been etiology. Pt was set up for 30 day cardiac monitoring, prior to being discharged to [facility] inpatient rehab.   Mild nonocclusive pulmonary emboli  Unclear source of emboli.  No known risk factor. Venous duplex negative for DVT and trans thoracic echocardiogram with negative bubble study and no evidence of clot. Hypercoagulable study not impressive. Patient also underwent transcranial ultrasound bubble study which was negative.  TEE negative  (see above). - Continued on heparin infusion until 2/4, and then started on Eliquis  May need repeat hypercoagulable panel at 6 to 9 months if anticoagulation is discontinued.  AoC HFrEF exacerbation Nonischemic hypertensive cardiomyopathy Acute pulmonary edema in the setting of a hypertensive crisis Hx of uncontrolled HTN Patient has nonischemic cardiomyopathy with severely reduced EF, thought was due to hypertension. Autoimmune and amyloid test was negative. Last EF 25-30% in 2021. He was started on IV diuresis that was eventually uptitrated Lasix to 80 mg bid with minimal response, and given worsening AKI, cardiology consulted for assistance. Patient was thought to be in low output state and was started on dobutamine but then changed to milrinone given HTN. HTN resulting in acute pulm edema requring up-titration of lasix. Continued to diurese slowly, and stopped IV lasix once euvolemic. Weaned off of milrinone as well. Started [place], but then held in setting of AKI. TEE revealing EF 10% with severe RV dysfunction Currently not candidate for advanced therapies given comorbidities Can consider starting Lasix depending on fluid status.   CKD3b Cr 1.88 on admission (baseline of 1.6-1.8). Initially uptrending, and thought to be cardiorenal in combination with prerenal. Renal US negative for hydronephrosis. Improved with start of inotropic therapy, stayed near 2; this may be his new baseline after aggressive diuresis, though did mildly improve on last day of admission with 1/2L fluids.    HTN Uncontrolled HTN in the setting of medication non-compliance. Regimen includes Coreg, hydralazine, and losartan. Patient's blood pressure medications were initially held for permissive hypertension. Goal BP [id number] systolic, but because of continued elevated, hydralazine uptitrated and then added Bidil, and then added Delene Loll and Spiro by Cardiology for better control. BP at goal with Bidil, Delene Loll, and  Arlyce Harman.   Macrocytosis Stable hgb 14.1, with MCV 100.2.  B12 and folate normal   Stable ascending  thoracic aortic dilatation  4.2 cm in size.  - Annual imaging follow-up recommended by CTA or MRA

## 2021-10-11 NOTE — ED Triage Notes (Signed)
Patient BIB [facility] from work, found by coworkers unresponsive in bathroom. Alert [facility] with EMS.

## 2021-10-11 NOTE — ED Notes (Signed)
Patient transported to CT 

## 2021-10-11 NOTE — H&P (Addendum)
Date: 10/11/2021               Patient Name:  Oscar Rosales MRN: [id number]  DOB: [birth year] Age / Sex: 56 y.o., male   PCP: Curt Jews, PA-C         Medical Service: Internal Medicine Teaching Service         Attending Physician: Dr. Dareen Piano    First Contact: Christiana Fuchs, DO Pager: [place] Dec [phone]  Second Contact: Tamsen Snider, MD Pager: Cherly Hensen [phone]       After Hours (After 5p/  First Contact Pager: [phone]  weekends / holidays): Second Contact Pager: [phone]    Chief Complaint: "passed out"  History of Present Illness:  Oscar Rosales is a 56 y.o. male with a pertinent PMH of HFrEF (EF 25-30% 02/2020), HLD, HTN, CAD, and CKD3b who presented to the [facility] ED after an episode of LOC.   In usual state of health until earlier today. He states that he woke up feeling tired. Went to work, and shortly after abruptly started to feel lightheaded and before he knew it, he passed out. Does not remember falling down, but woke up on ground. Unsure how long he was down, possibly 10-20 minutes. No witnesses of the the event. Someone found him down on the bathroom and called EMS. No seizure-like episodes were reported. He is unable to recollect events prior to, during, or immediately after the episode. He denies bowel or bladder loss, trauma, or tongue laceration.   Endorses changes in vision. Vision is darker at this time, which is new. He expresses frustration towards new inability to open eyelids. He also endorses word finding difficulty, left arm weakness, and and mild confusion, all of which are new today. Denies any fevers, chills. N/V/D, chest pain, SHOB, and urinary symptoms. He does complain of mild abdominal pain, and last BM was several days ago. Has not been fully adherent with home medications for the past ~3 mo.   Initial ED work with: -Negative CT head -MRI with scattered acute to subacute infarcts involving the right cerebellar hemisphere, left cerebral peduncle, left hippocampus, left  thalamus, right occipital lobe, and left pre and postcentral gyri; CTA head and neck without large vessel occlusion.  -CTA chest showing mild nonocclusive pulmonary emboli within the right middle lobe pulmonary artery and right lower lobe medial segment pulmonary artery.  -CXR with pulmonary vascular congestion and pulmonary edema in both lungs.  -Largely unremarkable BMP and CBC. Trop 48 -> 50.   Meds:  No current facility-administered medications on file prior to encounter.   Current Outpatient Medications on File Prior to Encounter  Medication Sig Dispense Refill   carvedilol (COREG) 25 MG tablet Take 37.5 mg by mouth 2 (two) times daily.     cloNIDine (CATAPRES - DOSED IN MG/24 HR) 0.3 mg/24hr patch Take 0.3 mg by mouth once a week.     digoxin (LANOXIN) 0.125 MG tablet Take 125 mcg by mouth every other day.     ergocalciferol (VITAMIN D2) 1.25 MG (50000 UT) capsule Take 1 capsule by mouth every Tuesday.     ferrous sulfate 325 (65 FE) MG tablet Take 325 mg by mouth every morning.     hydrALAZINE (APRESOLINE) 100 MG tablet Take 100 mg by mouth 2 (two) times daily.     isosorbide mononitrate (IMDUR) 60 MG 24 hr tablet Take 60 mg by mouth 2 (two) times daily.     losartan (COZAAR) 25 MG tablet Take 25 mg by mouth  daily.     magnesium oxide (MAG-OX) 400 MG tablet Take 400 mg by mouth 2 (two) times daily.     Multiple Vitamin (MULTIVITAMIN WITH MINERALS) TABS tablet Take 1 tablet by mouth daily.     torsemide (DEMADEX) 20 MG tablet Take 40 mg by mouth in the morning and at bedtime.     Vitamin D, Cholecalciferol, 25 MCG (1000 UT) TABS Take 1,000 Units by mouth daily.     potassium chloride SA (KLOR-CON) 20 MEQ tablet Take 1 tablet (20 mEq total) by mouth 2 (two) times daily for 3 days. 6 tablet 0   Allergies: Allergies as of 10/11/2021 - Review Complete 10/11/2021  Allergen Reaction Noted   Lisinopril Cough 05/27/2017   Past Medical History:  Diagnosis Date   CHF (congestive heart  failure) (HCC)    Hypertension    Renal disorder     Family History:  History reviewed. No pertinent family history.  Social History:   Lives in [place] with wife and children. Denies any tobacco, alcohol, or illicit drug use. Independent in ADLs and iADLs.  Review of Systems: A complete ROS was negative except as per HPI.    Physical Exam: Blood pressure (!) 157/118, pulse 78, temperature 98.3 F (36.8 C), temperature source Temporal, resp. rate (!) 31, height 5\' 11"  (1.803 m), weight 77.1 kg, SpO2 98 %.  Constitutional: alert, not toxic appearing, in NAD HENT: mild right-sided facial droop, sensation intact bilaterally, midline tongue Eyes: drooping of both eyelids, sluggish pupillary reflexes with constricted left pupil  Cardiovascular: RRR, no m/r/g, 4+ bilateral LE Pulmonary/Chest: normal work of breathing on 2L, LCTAB Abdominal: soft, non-tender to palpation, non-distended, decreased bowel sounds  Neurological: A&O x 3, follows commands. 5/5 strength in all extremities except 3/5 in right upper extremity. Reduced grip with left hand. Sensation intact.  Skin: cool extremities   EKG: sinus rhythm   CXR: pulmonary vascular congestion and pulmonary edema in both lungs  Assessment & Plan by Problem: Principal Problem:   Stroke [facility])  Oscar Rosales is a 56 y.o. male with a pertinent PMH of HFrEF (EF 25-30% 02/2020), HLD, HTN, CAD, and CKD3b admitted for acute embolic infarctions in the setting of mild nonocclusive pulmonary emboli.    Multifocal ischemic infarcts  Mild nonocclusive pulmonary emboli  Multiple scattered acute to subacute infarcts involving the right cerebellar hemisphere, left cerebral peduncle, left hippocampus, left thalamus, right occipital lobe, and left pre and postcentral gyri suggesting embolic etiology, in the setting of pulmonary emboli and no large vessel disease. Increased suspicion for paradoxical emboli/shunt. Deficits include right upper extremity  weakness, right facial droop, and word finding difficulty. Has sluggish pupillary reflexes with constricted left pupil.  - Neurology consulted, follow up on recommendations  - Allow for permissive hypertension  - Heparin infusion - Echo pending  - Vascular US LE pending  - Lipid panel  - Hgb A1c  - Telemetry - Frequent neuro checks  - SLP - PT/OT  AoC HFrEF exacerbation (EF 25-30%) Regimen includes digoxin 125 mcg, Coreg 37.5 BID, and losartan 25 mg. Recent increase in torsemide from 10 mg qd to 40 mg bid. Poor medication compliance. Last EF 25-30% from 02/2020. BNP elevated 3900 and CXR with bilateral pulmonary edema. Significant fluid overload on exam, with 4+ pitting edema.  - Lasix 80 mg IV now - Lasix 40 mg IV bid  - Echo - Daily weights and I/O's - Fluid restriction  - Cardiac monitoring  - BMP  HTN Uncontrolled  HTN in the setting of medication non-compliance. Regimen includes Coreg, hydralazine, and losartan.  - Holding BP medications to allow for permissive hypertension  CKD3b Cr 1.88, near baseline of 1.6-1.8 - Trend BMP - Avoid nephrotoxic agents   Macrocytic anemia  Stable hgb 14.1, with MCV 100.2. - Folate pending  - Vitamin B12 pending   Hypokalemia  K 3.4; repleted. - BMP   Stable ascending thoracic aortic dilatation  4.2 cm in size.  - Annual imaging follow-up recommended by CTA or MRA     Best Practice: Diet: Dysphagia 3  IVF: None,None VTE: Heparin Code: Full   [name], MD  Internal Medicine Resident, PGY-1 [facility]  Pager: [phone] 10:01 PM, 10/11/2021

## 2021-10-11 NOTE — Progress Notes (Signed)
ANTICOAGULATION CONSULT NOTE   Pharmacy Consult for IV heparin Indication: pulmonary embolus  Allergies  Allergen Reactions   Lisinopril Cough    SWITCHED TO LOSARTAN    Patient Measurements: Height: 5\' 11"  (180.3 cm) Weight: 77.1 kg (170 lb) IBW/kg (Calculated) : 75.3 Heparin Dosing Weight: 77.1kg  Vital Signs: Temp: 97.8 F (36.6 C) (01/27 2240) Temp Source: Oral (01/27 2240) BP: 156/118 (01/27 2240) Pulse Rate: 90 (01/27 2240)  Labs: Recent Labs    10/11/21 1330 10/11/21 1338 10/11/21 1644 10/11/21 2256  HGB 14.1 14.6   15.0  --   --   HCT 45.3 43.0   44.0  --   --   PLT 165  --   --   --   LABPROT 15.7*  --   --   --   INR 1.3*  --   --   --   HEPARINUNFRC  --   --   --  0.87*  CREATININE 1.88* 1.90*  --   --   CKTOTAL 290  --   --   --   TROPONINIHS 48*  --  50*  --      Estimated Creatinine Clearance: 46.2 mL/min (A) (by C-G formula based on SCr of 1.9 mg/dL (H)).   Medical History: Past Medical History:  Diagnosis Date   CHF (congestive heart failure) (HCC)    Hypertension    Renal disorder    Assessment: 67 YOM  presenting to the ED after found unresponsive at work. No history of anticoagulation prior to admission. CT angio positive for mild nonocclusive PE in the right lobe. Pharmacy to dose IV heparin.   H/H is stable and within normal limits, platelets are within normal limits. Renal function is okay.  1/27 PM update:  Heparin level above goal  Goal of Therapy:  Heparin level 0.3-0.7 units/ml Monitor platelets by anticoagulation protocol: Yes   Plan:  Dec heparin to 1200 units/hr 0800 heparin level  2/27, PharmD, BCPS Clinical Pharmacist Phone: [phone]

## 2021-10-11 NOTE — Discharge Instructions (Addendum)
You were admitted because of a stroke and a pulmonary embolism. You were followed by the neurology team and we started you on the correct medications. You also were in a heart failure exacerbation and we got got rid of lots of fluids. You were started on several medications which are detailed on your discharge. It will be important for you to follow up with your PCP provider and to establish with cardiology.   Information on my medicine - ELIQUIS (apixaban)  Why was Eliquis prescribed for you? Eliquis was prescribed to treat blood clots that may have been found in the veins of your legs (deep vein thrombosis) or in your lungs (pulmonary embolism) and to reduce the risk of them occurring again.  What do You need to know about Eliquis ? The dose is ONE 5 mg tablet taken TWICE daily.  Eliquis may be taken with or without food.   Try to take the dose about the same time in the morning and in the evening. If you have difficulty swallowing the tablet whole please discuss with your pharmacist how to take the medication safely.  Take Eliquis exactly as prescribed and DO NOT stop taking Eliquis without talking to the doctor who prescribed the medication.  Stopping may increase your risk of developing a new blood clot.  Refill your prescription before you run out.  After discharge, you should have regular check-up appointments with your healthcare provider that is prescribing your Eliquis.    What do you do if you miss a dose? If a dose of ELIQUIS is not taken at the scheduled time, take it as soon as possible on the same day and twice-daily administration should be resumed. The dose should not be doubled to make up for a missed dose.  Important Safety Information A possible side effect of Eliquis is bleeding. You should call your healthcare provider right away if you experience any of the following: Bleeding from an injury or your nose that does not stop. Unusual colored urine (red or dark  brown) or unusual colored stools (red or black). Unusual bruising for unknown reasons. A serious fall or if you hit your head (even if there is no bleeding).  Some medicines may interact with Eliquis and might increase your risk of bleeding or clotting while on Eliquis. To help avoid this, consult your healthcare provider or pharmacist prior to using any new prescription or non-prescription medications, including herbals, vitamins, non-steroidal anti-inflammatory drugs (NSAIDs) and supplements.  This website has more information on Eliquis (apixaban): http://www.eliquis.com/eliquis/home

## 2021-10-11 NOTE — TOC CAGE-AID Note (Signed)
Transition of Care [facility]) - CAGE-AID Screening   Patient Details  Name: Oscar Rosales MRN: [id number] Date of Birth: August 25, 1966  Transition of Care [facility]) CM/SW Contact:    Army Melia, RN Phone Number:[phone] 10/11/2021, 8:42 PM   Clinical Narrative:  Patient presents to the hospital after being found down at his place of work, unresponsive initially and awake now, confused. Unable to participate in screening at this time.  CAGE-AID Screening: Substance Abuse Screening unable to be completed due to: : Patient unable to participate (confused, unable to participate in screening at this time)

## 2021-10-11 NOTE — ED Notes (Signed)
Admit providers at bedside 

## 2021-10-11 NOTE — Progress Notes (Signed)
ANTICOAGULATION CONSULT NOTE - Initial Consult  Pharmacy Consult for IV heparin Indication: pulmonary embolus  Allergies  Allergen Reactions   Lisinopril Cough    SWITCHED TO LOSARTAN    Patient Measurements: Height: 5\' 11"  (180.3 cm) Weight: 77.1 kg (170 lb) IBW/kg (Calculated) : 75.3 Heparin Dosing Weight: 77.1kg  Vital Signs: Temp: 96 F (35.6 C) (01/27 1242) BP: 153/120 (01/27 1630) Pulse Rate: 78 (01/27 1630)  Labs: Recent Labs    10/11/21 1330 10/11/21 1338 10/11/21 1644  HGB 14.1 14.6   15.0  --   HCT 45.3 43.0   44.0  --   PLT 165  --   --   LABPROT 15.7*  --   --   INR 1.3*  --   --   CREATININE 1.88* 1.90*  --   CKTOTAL 290  --   --   TROPONINIHS 48*  --  50*    Estimated Creatinine Clearance: 46.2 mL/min (A) (by C-G formula based on SCr of 1.9 mg/dL (H)).   Medical History: Past Medical History:  Diagnosis Date   CHF (congestive heart failure) ([place])    Hypertension    Renal disorder    Assessment: 21 YOM  presenting to the ED after found unresponsive at work. No history of anticoagulation prior to admission. CT angio positive for mild nonocclusive PE in the right lobe. Pharmacy to dose IV heparin.   H/H is stable and within normal limits, platelets are within normal limits. Renal function is okay.  Goal of Therapy:  Heparin level 0.3-0.7 units/ml Monitor platelets by anticoagulation protocol: Yes   Plan:  Heparin IV 4600 unit bolus x 1 Start IV heparin gtt @ 1300 units/h 6h heparin level Daily heparin level, CBC Monitor for signs/symptoms of bleeding Follow-up long-term anticoagulation plan  Thank you for involving pharmacy in this patient's care.  Elita Quick, PharmD PGY1 Ambulatory Care Pharmacy Resident 10/11/2021 6:38 PM  **Pharmacist phone directory can be found on [email or web address] listed under [place]**

## 2021-10-11 NOTE — Progress Notes (Signed)
Orthopedic Tech Progress Note Patient Details:  Oscar Rosales Aug 30, 1966 [id number] Level 2 Trauma. Not needed Patient ID: Oscar Rosales, male   DOB: 1966-07-03, 56 y.o.   MRN: [id number]  Oscar Rosales 10/11/2021, 1:00 PM

## 2021-10-11 NOTE — ED Provider Notes (Signed)
[facility] EMERGENCY DEPARTMENT Provider Note   CSN: [id number] Arrival date & time: 10/11/21  1237     History  Chief Complaint  Patient presents with   [name]    Oscar Rosales is a 56 y.o. male.  This is a 56 y.o. male with significant medical history as below, including CHF, hypertension, kidney disease who presents to the ED with complaint of LOC, AMS.  Level 2 trauma.  Patient found down at place of employment in the bathroom.  Patient altered per bystanders, EMS was called.  No seizure-like episodes were noted.  EMS reported patient with right-sided upper extremity possible posturing, anisocoria.  Airway cleared at bedside.  Patient reports mild headache, does not recall fall.  Patient recalls going to the restroom, no prodromal symptoms, woke up on the ground.  Denies illicit drug use, denies alcohol use reports compliant with home medication.      Level 5 caveat, trauma, poor historian     Past Medical History: No date: CHF (congestive heart failure) (HCC) No date: Hypertension No date: Renal disorder  No past surgical history on file.    The history is provided by the patient and the EMS personnel. No language interpreter was used.      Home Medications Prior to Admission medications   Medication Sig Start Date End Date Taking? Authorizing Provider  carvedilol (COREG) 25 MG tablet Take 37.5 mg by mouth 2 (two) times daily. 09/09/20   [provider]  cyanocobalamin 1000 MCG tablet Take 1 tablet by mouth daily. 11/13/20   [provider]  digoxin (LANOXIN) 0.125 MG tablet Take 125 mcg by mouth every other day. 11/15/20   [provider]  ergocalciferol (VITAMIN D2) 1.25 MG (50000 UT) capsule Take 1 capsule by mouth every Tuesday. 11/13/20   [provider]  ferrous sulfate 325 (65 FE) MG tablet Take 325 mg by mouth every morning. 11/21/20   [provider]  hydrALAZINE (APRESOLINE) 100 MG tablet Take 100 mg by  mouth 2 (two) times daily. 11/20/20   [provider]  isosorbide mononitrate (IMDUR) 60 MG 24 hr tablet Take 60 mg by mouth 2 (two) times daily. 11/20/20   [provider]  losartan (COZAAR) 25 MG tablet Take 25 mg by mouth daily. 09/30/20   [provider]  magnesium oxide (MAG-OX) 400 MG tablet Take 400 mg by mouth 2 (two) times daily. 11/21/20   [provider]  Multiple Vitamin (MULTIVITAMIN WITH MINERALS) TABS tablet Take 1 tablet by mouth daily.    [provider]  potassium chloride SA (KLOR-CON) 20 MEQ tablet Take 1 tablet (20 mEq total) by mouth 2 (two) times daily for 3 days. 11/29/20 12/02/20  Sponseller, Gypsy Balsam, PA-C  torsemide (DEMADEX) 20 MG tablet Take 40 mg by mouth in the morning and at bedtime. 11/13/20   [provider]  Vitamin D, Cholecalciferol, 25 MCG (1000 UT) TABS Take 1,000 Units by mouth daily.    [provider]      Allergies    Lisinopril    Review of Systems   Review of Systems  Unable to perform ROS: Acuity of condition   Physical Exam Updated Vital Signs BP (!) 161/127    Pulse 77    Temp (!) 96 F (35.6 C)    Resp 17    Ht 5\' 11"  (1.803 m)    Wt 77.1 kg    SpO2 97%    BMI 23.71 kg/m  Physical  Exam Vitals and nursing note reviewed. Exam conducted with a chaperone present.  Constitutional:      General: He is not in acute distress.    Appearance: He is not ill-appearing, toxic-appearing or diaphoretic.  HENT:     Head: Normocephalic.     Right Ear: External ear normal.     Left Ear: External ear normal.     Nose: Nose normal.     Mouth/Throat:     Mouth: Mucous membranes are moist.     Comments: Small laceration lateral right side tongue. Eyes:     General: No scleral icterus.       Right eye: No discharge.        Left eye: No discharge.     Extraocular Movements: Extraocular movements intact.     Comments: Anisocoria, left pupil 5 mm, right pupil 2 mm.  There is pain with eye movement. ?   Proptosis left eye  Neck:     Comments: C-spine precautions Cardiovascular:     Rate and Rhythm: Normal rate and regular rhythm.     Pulses: Normal pulses.     Heart sounds: Normal heart sounds.  Pulmonary:     Effort: Pulmonary effort is normal. No respiratory distress.     Breath sounds: Normal breath sounds. No wheezing.  Abdominal:     General: Abdomen is flat.     Palpations: Abdomen is soft.     Tenderness: There is no abdominal tenderness. There is no guarding.  Genitourinary:    Penis: Normal.   Musculoskeletal:     Cervical back: No tenderness.     Right lower leg: Edema present.     Left lower leg: Edema present.     Comments: Right upper extremity retracted towards torso  Skin:    General: Skin is warm and dry.     Capillary Refill: Capillary refill takes less than 2 seconds.     Coloration: Skin is not jaundiced.  Neurological:     Mental Status: He is alert and oriented to person, place, and time. He is confused.     GCS: GCS eye subscore is 4. GCS verbal subscore is 4. GCS motor subscore is 6.     Cranial Nerves: No dysarthria or facial asymmetry.     Sensory: Sensation is intact.     Motor: Motor function is intact. No weakness or tremor.     Comments: Limited compliance with neurologic exam secondary to pain with eyes, eye movement.  Responses are coherent but slowed.  Finger-to-nose testing is slowed, no clear ataxia.    ED Results / Procedures / Treatments   Labs (all labs ordered are listed, but only abnormal results are displayed) Labs Reviewed  COMPREHENSIVE METABOLIC PANEL - Abnormal; Notable for the following components:      Result Value   Potassium 3.4 (*)    BUN 27 (*)    Creatinine, Ser 1.88 (*)    Calcium 8.3 (*)    Total Protein 5.2 (*)    Albumin 2.6 (*)    Total Bilirubin 1.4 (*)    GFR, Estimated 41 (*)    All other components within normal limits  CBC - Abnormal; Notable for the following components:   MCV 100.2 (*)    All other  components within normal limits  URINALYSIS, ROUTINE W REFLEX MICROSCOPIC - Abnormal; Notable for the following components:   Protein, ur 100 (*)    Bacteria, UA RARE (*)    All other components within  normal limits  PROTIME-INR - Abnormal; Notable for the following components:   Prothrombin Time 15.7 (*)    INR 1.3 (*)    All other components within normal limits  D-DIMER, QUANTITATIVE - Abnormal; Notable for the following components:   D-Dimer, Quant 0.84 (*)    All other components within normal limits  BRAIN NATRIURETIC PEPTIDE - Abnormal; Notable for the following components:   B Natriuretic Peptide 3,942.1 (*)    All other components within normal limits  I-STAT CHEM 8, ED - Abnormal; Notable for the following components:   BUN 34 (*)    Creatinine, Ser 1.90 (*)    Calcium, Ion 1.01 (*)    All other components within normal limits  I-STAT VENOUS BLOOD GAS, ED - Abnormal; Notable for the following components:   pH, Ven 7.502 (*)    pCO2, Ven 36.5 (*)    Bicarbonate 28.6 (*)    Acid-Base Excess 5.0 (*)    Calcium, Ion 1.03 (*)    All other components within normal limits  TROPONIN I (HIGH SENSITIVITY) - Abnormal; Notable for the following components:   Troponin I (High Sensitivity) 48 (*)    All other components within normal limits  RESP PANEL BY RT-PCR (FLU A&B, COVID) ARPGX2  ETHANOL  LACTIC ACID, PLASMA  CK  RAPID URINE DRUG SCREEN, HOSP PERFORMED  OSMOLALITY  SAMPLE TO BLOOD BANK  TROPONIN I (HIGH SENSITIVITY)    EKG EKG Interpretation  Date/Time:  Friday October 11 2021 13:15:36 EST Ventricular Rate:  84 PR Interval:  179 QRS Duration: 120 QT Interval:  424 QTC Calculation: 502 R Axis:   132 Text Interpretation: Sinus rhythm Left atrial enlargement Nonspecific intraventricular conduction delay Probable anterolateral infarct, old Borderline T abnormalities, inferior leads Baseline wander in lead(s) V4 Similar to prior Confirmed by Wynona Dove ([id number]) on  10/11/2021 3:17:21 PM  Radiology CT ANGIO HEAD NECK W WO CM  Result Date: 10/11/2021 CLINICAL DATA:  Altered mental status EXAM: CT ANGIOGRAPHY HEAD AND NECK TECHNIQUE: Multidetector CT imaging of the head and neck was performed using the standard protocol during bolus administration of intravenous contrast. Multiplanar CT image reconstructions and MIPs were obtained to evaluate the vascular anatomy. Carotid stenosis measurements (when applicable) are obtained utilizing NASCET criteria, using the distal internal carotid diameter as the denominator. RADIATION DOSE REDUCTION: This exam was performed according to the departmental dose-optimization program which includes automated exposure control, adjustment of the mA and/or kV according to patient size and/or use of iterative reconstruction technique. CONTRAST:  163mL OMNIPAQUE IOHEXOL 350 MG/ML SOLN COMPARISON:  Head CT earlier same day FINDINGS: CT HEAD Brain: There is no acute intracranial hemorrhage, mass effect, or edema. No new loss of Gertrude Bucks-white differentiation. Stable findings of probable chronic microvascular ischemic changes in the cerebral white matter. There is no extra-axial fluid collection. Ventricles and sulci are stable in size and configuration. Vascular: There are evaluated on CTA portion. Skull: Calvarium is unremarkable. Sinuses/Orbits: No acute finding. Other: None. Review of the MIP images confirms the above findings CTA NECK Aortic arch: Dilatation of the included ascending thoracic aorta measuring 4.2 cm. This is unchanged from prior chest CT. Great vessel origins are patent. Direct origin of the left vertebral from the arch. Right carotid system: Patent. No stenosis or evidence of dissection. Left carotid system: Patent.  No stenosis or evidence of dissection. Vertebral arteries: Patent. Right vertebral is dominant. No stenosis or evidence of dissection. Skeleton: Cervical spine degenerative changes better evaluated on same day  dedicated  imaging. Other neck: Unremarkable. Upper chest: Interlobular septal thickening. Chest is dictated separately. Review of the MIP images confirms the above findings CTA HEAD Anterior circulation: Intracranial internal carotid arteries are patent with mild atherosclerotic irregularity and plaque but no significant stenosis. Anterior and middle cerebral arteries are patent. Posterior circulation: Intracranial vertebral arteries, basilar artery, and posterior cerebral arteries are patent. Venous sinuses: Patent as allowed by contrast bolus timing. Review of the MIP images confirms the above findings IMPRESSION: No acute intracranial abnormality. No large vessel occlusion, hemodynamically significant stenosis, or evidence of dissection. 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 Annual imaging follow-up is recommended by CTA or MRA. Electronically Signed   By: Macy Mis M.D.   On: 10/11/2021 15:30   CT HEAD WO CONTRAST  Result Date: 10/11/2021 CLINICAL DATA:  Trauma. EXAM: CT HEAD WITHOUT CONTRAST CT MAXILLOFACIAL WITHOUT CONTRAST CT CERVICAL SPINE WITHOUT CONTRAST TECHNIQUE: Multidetector CT imaging of the head, cervical spine, and maxillofacial structures were performed using the standard protocol without intravenous contrast. Multiplanar CT image reconstructions of the cervical spine and maxillofacial structures were also generated. RADIATION DOSE REDUCTION: This exam was performed according to the departmental dose-optimization program which includes automated exposure control, adjustment of the mA and/or kV according to patient size and/or use of iterative reconstruction technique. COMPARISON:  March 14, 2021. FINDINGS: CT HEAD FINDINGS Brain: Mild chronic ischemic white matter disease is noted. No mass effect or midline shift is noted. Ventricular size is within normal limits. There is no evidence of mass lesion, hemorrhage or acute infarction. Vascular: No hyperdense  vessel or unexpected calcification. Skull: Normal. Negative for fracture or focal lesion. Other: None. CT MAXILLOFACIAL FINDINGS Osseous: No fracture or mandibular dislocation. No destructive process. Orbits: Negative. No traumatic or inflammatory finding. Sinuses: Clear. Soft tissues: Negative. CT CERVICAL SPINE FINDINGS Alignment: Grade 1 retrolisthesis of C3-4 and C4-5 is noted secondary to moderate degenerative disc disease at these levels. Skull base and vertebrae: No acute fracture. No primary bone lesion or focal pathologic process. Soft tissues and spinal canal: No prevertebral fluid or swelling. No visible canal hematoma. Disc levels: Moderate degenerative disc disease is noted at C3-4, C4-5 and C6-7. Upper chest: Negative. Other: None. IMPRESSION: No acute intracranial abnormality seen. No definite abnormality seen in maxillofacial region. Moderate multilevel degenerative disc disease is noted in the cervical spine. No fracture or other acute abnormality is noted in the cervical spine. Electronically Signed   By: Marijo Conception M.D.   On: 10/11/2021 13:21   CT Angio Chest PE W and/or Wo Contrast  Result Date: 10/11/2021 CLINICAL DATA:  Positive D-dimer. Pulmonary embolism suspected. Altered mental status. EXAM: CT ANGIOGRAPHY CHEST WITH CONTRAST TECHNIQUE: Multidetector CT imaging of the chest was performed using the standard protocol during bolus administration of intravenous contrast. Multiplanar CT image reconstructions and MIPs were obtained to evaluate the vascular anatomy. RADIATION DOSE REDUCTION: This exam was performed according to the departmental dose-optimization program which includes automated exposure control, adjustment of the mA and/or kV according to patient size and/or use of iterative reconstruction technique. CONTRAST:  183mL OMNIPAQUE IOHEXOL 350 MG/ML SOLN COMPARISON:  AP chest 10/11/2021 03/14/2021, CT chest 12/28/2020 CTA chest 08/26/2019 FINDINGS: Cardiovascular: The main  pulmonary artery is opacified up to 447 Hounsfield units. There are small linear filling defects suspicious for pulmonary emboli within the peripheral anterior aspect of the distal right middle lobe pulmonary artery (axial series 5, image 81) and the medial segment of the  right lower lobe pulmonary artery (axial series 5, image 88). The ascending aorta measures up to 4.2 cm in caliber, mildly aneurysmal. This is unchanged from prior. There is again a conjoined origin of the right brachiocephalic and left common carotid artery off the aortic arch, a normal variant. Additional normal-variant direct takeoff of the left vertebral artery from the aortic arch is again seen. Heart size is markedly enlarged measuring up to approximately 18 cm in transverse dimension. This may be slightly increased from 16 cm on 08/26/2019. No signs of right-sided heart strain. Mediastinum/Nodes: No axillary, mediastinal, or hilar pathologically enlarged lymph nodes by CT criteria. The esophagus follows normal course of normal caliber. Lungs/Pleura: The central airways are patent. Mild bilateral upper lung centrilobular emphysematous changes. There are scattered bilateral ground-glass opacities, greatest within the lower lungs. These are mildly increased compared to 08/26/2019. Small right pleural effusion and tiny left pleural effusion, new from prior. Chronic interlobular septal thickening within the right-greater-than-left lung bases, unchanged. Upper Abdomen: Limited arterial phase images of the upper abdomen again demonstrate a partially visualized fluid density likely cyst overlying the left kidney measuring up to 5.0 cm. Musculoskeletal: Mild-to-moderate multilevel degenerative disc changes of the thoracolumbar spine. There are flowing bridging osteophytes again compatible with DISH. Review of the MIP images confirms the above findings. IMPRESSION:: IMPRESSION: 1. Mild nonocclusive pulmonary emboli within the right middle lobe  pulmonary artery and right lower lobe medial segment pulmonary artery. 2. Stable ascending aortic aneurysm measuring up to 4.2 cm. 3. Marked cardiomegaly, possibly mildly worsened from 08/26/2019. 4. Small right and trace left pleural effusions. Scattered ground-glass opacities compatible with pulmonary edema, subsegmental atelectasis, and/or a mild infectious/inflammatory process. Critical Value/emergent results IMPRESSION #1 was called by telephone at the time of interpretation on 10/11/2021 at 3:37 pm to provider Dr. Cathren Laine, who verbally acknowledged these results. Electronically Signed   By: Neita Garnet M.D.   On: 10/11/2021 15:44   CT CERVICAL SPINE WO CONTRAST  Result Date: 10/11/2021 CLINICAL DATA:  Trauma. EXAM: CT HEAD WITHOUT CONTRAST CT MAXILLOFACIAL WITHOUT CONTRAST CT CERVICAL SPINE WITHOUT CONTRAST TECHNIQUE: Multidetector CT imaging of the head, cervical spine, and maxillofacial structures were performed using the standard protocol without intravenous contrast. Multiplanar CT image reconstructions of the cervical spine and maxillofacial structures were also generated. RADIATION DOSE REDUCTION: This exam was performed according to the departmental dose-optimization program which includes automated exposure control, adjustment of the mA and/or kV according to patient size and/or use of iterative reconstruction technique. COMPARISON:  March 14, 2021. FINDINGS: CT HEAD FINDINGS Brain: Mild chronic ischemic white matter disease is noted. No mass effect or midline shift is noted. Ventricular size is within normal limits. There is no evidence of mass lesion, hemorrhage or acute infarction. Vascular: No hyperdense vessel or unexpected calcification. Skull: Normal. Negative for fracture or focal lesion. Other: None. CT MAXILLOFACIAL FINDINGS Osseous: No fracture or mandibular dislocation. No destructive process. Orbits: Negative. No traumatic or inflammatory finding. Sinuses: Clear. Soft tissues:  Negative. CT CERVICAL SPINE FINDINGS Alignment: Grade 1 retrolisthesis of C3-4 and C4-5 is noted secondary to moderate degenerative disc disease at these levels. Skull base and vertebrae: No acute fracture. No primary bone lesion or focal pathologic process. Soft tissues and spinal canal: No prevertebral fluid or swelling. No visible canal hematoma. Disc levels: Moderate degenerative disc disease is noted at C3-4, C4-5 and C6-7. Upper chest: Negative. Other: None. IMPRESSION: No acute intracranial abnormality seen. No definite abnormality seen in maxillofacial region. Moderate multilevel degenerative  disc disease is noted in the cervical spine. No fracture or other acute abnormality is noted in the cervical spine. Electronically Signed   By: Marijo Conception M.D.   On: 10/11/2021 13:21   DG Pelvis Portable  Result Date: 10/11/2021 CLINICAL DATA:  Found down EXAM: PORTABLE PELVIS 1-2 VIEWS COMPARISON:  None. FINDINGS: No recent fracture or dislocation is seen. Degenerative changes are noted in the visualized lower lumbar spine. IMPRESSION: No displaced fracture or dislocation is seen in the pelvis. Electronically Signed   By: Elmer Picker M.D.   On: 10/11/2021 13:47   DG Chest Port 1 View  Result Date: 10/11/2021 CLINICAL DATA:  Found down, difficulty breathing EXAM: PORTABLE CHEST 1 VIEW COMPARISON:  03/14/2021 FINDINGS: Transverse diameter of heart is increased. Central pulmonary vessels are more prominent. Increased interstitial and alveolar markings are seen in the parahilar regions and lower lung fields on both sides. Lateral CP angles are clear. There is no pneumothorax. IMPRESSION: Cardiomegaly. There is pulmonary vascular congestion and pulmonary edema in both lungs. Possibility of underlying pneumonia is not excluded. Electronically Signed   By: Elmer Picker M.D.   On: 10/11/2021 13:46   CT Maxillofacial Wo Contrast  Result Date: 10/11/2021 CLINICAL DATA:  Trauma. EXAM: CT HEAD  WITHOUT CONTRAST CT MAXILLOFACIAL WITHOUT CONTRAST CT CERVICAL SPINE WITHOUT CONTRAST TECHNIQUE: Multidetector CT imaging of the head, cervical spine, and maxillofacial structures were performed using the standard protocol without intravenous contrast. Multiplanar CT image reconstructions of the cervical spine and maxillofacial structures were also generated. RADIATION DOSE REDUCTION: This exam was performed according to the departmental dose-optimization program which includes automated exposure control, adjustment of the mA and/or kV according to patient size and/or use of iterative reconstruction technique. COMPARISON:  March 14, 2021. FINDINGS: CT HEAD FINDINGS Brain: Mild chronic ischemic white matter disease is noted. No mass effect or midline shift is noted. Ventricular size is within normal limits. There is no evidence of mass lesion, hemorrhage or acute infarction. Vascular: No hyperdense vessel or unexpected calcification. Skull: Normal. Negative for fracture or focal lesion. Other: None. CT MAXILLOFACIAL FINDINGS Osseous: No fracture or mandibular dislocation. No destructive process. Orbits: Negative. No traumatic or inflammatory finding. Sinuses: Clear. Soft tissues: Negative. CT CERVICAL SPINE FINDINGS Alignment: Grade 1 retrolisthesis of C3-4 and C4-5 is noted secondary to moderate degenerative disc disease at these levels. Skull base and vertebrae: No acute fracture. No primary bone lesion or focal pathologic process. Soft tissues and spinal canal: No prevertebral fluid or swelling. No visible canal hematoma. Disc levels: Moderate degenerative disc disease is noted at C3-4, C4-5 and C6-7. Upper chest: Negative. Other: None. IMPRESSION: No acute intracranial abnormality seen. No definite abnormality seen in maxillofacial region. Moderate multilevel degenerative disc disease is noted in the cervical spine. No fracture or other acute abnormality is noted in the cervical spine. Electronically Signed   By:  Marijo Conception M.D.   On: 10/11/2021 13:21    Procedures .Critical Care Performed by: Jeanell Sparrow, DO Authorized by: Jeanell Sparrow, DO   Critical care provider statement:    Critical care time (minutes):  30   Critical care time was exclusive of:  Separately billable procedures and treating other patients   Critical care was necessary to treat or prevent imminent or life-threatening deterioration of the following conditions:  CNS failure or compromise and cardiac failure   Critical care was time spent personally by me on the following activities:  Development of treatment plan with patient  or surrogate, discussions with consultants, evaluation of patient's response to treatment, examination of patient, ordering and review of laboratory studies, ordering and review of radiographic studies, ordering and performing treatments and interventions, pulse oximetry, re-evaluation of patient's condition and review of old charts    Medications Ordered in ED Medications  sodium chloride 0.9 % bolus 1,000 mL (0 mLs Intravenous Stopped 10/11/21 1442)  iohexol (OMNIPAQUE) 350 MG/ML injection 100 mL (100 mLs Intravenous Contrast Given 10/11/21 1456)    ED Course/ Medical Decision Making/ A&P Clinical Course as of 10/11/21 1546  Fri Oct 11, 2021  1350 D/w neuro, r/o vert/carotid dissection, illicit substance. Rec'd CTA head/neck and MRI brain [SG]  1429 D-Dimer, Quant(!): 0.84 Low risk well's score, pos syncope. Dimer elevated, obtain CTPE [SG]  1430 CK Total: 290 [SG]  1430 Lactic Acid, Venous: 1.7 CPK and LA are not elevated, seizure seems less likely [SG]    Clinical Course User Index [SG] Jeanell Sparrow, DO                           Medical Decision Making Amount and/or Complexity of Data Reviewed Independent Historian: spouse External Data Reviewed: labs, radiology, ECG and notes. Labs: ordered. Decision-making details documented in ED Course. Radiology: ordered and independent  interpretation performed. Decision-making details documented in ED Course. ECG/medicine tests: ordered and independent interpretation performed. Decision-making details documented in ED Course.  Risk Prescription drug management.    CC: AMS, fall  This patient presents to the Emergency Department for the above complaint. This involves an extensive number of treatment options and is a complaint that carries with it a high risk of complications and morbidity. Vital signs were reviewed. Serious etiologies considered.  Anisocoria, possible retraction of right arm, question posturing right side upper extremity.  No sensation changes.  Radial pulses are equal.  Patient sent emergently to CT scanner.  Primary survey -Airway intact -Clear breath sounds bilateral, trachea is midline -Capillary refill is brisk to extremities, warm well perfused, equal radial and DP pulses bilaterally. -GCS 15, ANO x3, anisocoria as noted above -No external evidence of trauma  Record review:  Previous records obtained and reviewed   Additional history obtained from EMS  Medical and surgical history as noted above.   Work up as above, notable for:  Labs & imaging results that were available during my care of the patient were reviewed by me and considered in my medical decision making.   I ordered imaging studies which included CT head, CT max face, CT cervical spine, chest x-ray, pelvis x-ray CTA head  neck, MRI brain and I independently visualized and interpreted imaging which showed CT head, face, C-spine nonacute.  Chest x-ray and pelvis x-ray nonacute.  CTA and MRI pending  Cardiac monitoring reviewed and interpreted personally which shows normal sinus rhythm  Social determinants of health include - N/a  Personally discussed patient care with consultant; neurology; recommendations as above  Patient found to have elevated troponin, EKG without evidence of acute ischemia.  Concern for likely demand  ischemia.  No chest pain or dyspnea.  Did not give aspirin as concern for possible intracranial hemorrhage  BNP elevated approximately 4000, history of CHF, on torsemide.  He has lower extremity edema.  No dyspnea, no evidence of acute pulmonary edema, he is breathing comfortably on room air.  Renal function appears to be at his approximate baseline.  Management: Patient given IV fluids  Reassessment:  Patient's mental status  is improving, he still does report some difficulty with his eye-opening.  Neurologic exam is nonfocal.  Moving all 4 extremities spontaneously.    He reports that he felt lightheaded and then he was on the floor.  Not recall any other details associated with the events preceding his arrival to the hospital.  Adamantly denies illicit drug use, alcohol use, stimulant use.  Remainder of imaging pending at time of handoff. True etiology of patient's presenting symptoms is unclear at this time. If CTA/MRI were found to be negative for hemorrhagic component would recommend giving an ASA for NSTEMI (no cp). Patient will likely require admission for CHF exacerbation, delirium.          This chart was dictated using voice recognition software.  Despite best efforts to proofread,  errors can occur which can change the documentation meaning.         Final Clinical Impression(s) / ED Diagnoses Final diagnoses:  Trauma  Delirium  Acute on chronic congestive heart failure, unspecified heart failure type [facility])  NSTEMI (non-ST elevated myocardial infarction) ([place])  Fall, initial encounter    Rx / DC Orders ED Discharge Orders     None         Jeanell Sparrow, DO 10/11/21 1551

## 2021-10-12 ENCOUNTER — Observation Stay (HOSPITAL_COMMUNITY): Payer: BC Managed Care – PPO

## 2021-10-12 DIAGNOSIS — I2699 Other pulmonary embolism without acute cor pulmonale: Secondary | ICD-10-CM

## 2021-10-12 DIAGNOSIS — R410 Disorientation, unspecified: Secondary | ICD-10-CM | POA: Diagnosis present

## 2021-10-12 DIAGNOSIS — N179 Acute kidney failure, unspecified: Secondary | ICD-10-CM | POA: Diagnosis present

## 2021-10-12 DIAGNOSIS — I130 Hypertensive heart and chronic kidney disease with heart failure and stage 1 through stage 4 chronic kidney disease, or unspecified chronic kidney disease: Secondary | ICD-10-CM | POA: Diagnosis present

## 2021-10-12 DIAGNOSIS — N1832 Chronic kidney disease, stage 3b: Secondary | ICD-10-CM | POA: Diagnosis present

## 2021-10-12 DIAGNOSIS — I351 Nonrheumatic aortic (valve) insufficiency: Secondary | ICD-10-CM | POA: Diagnosis not present

## 2021-10-12 DIAGNOSIS — E876 Hypokalemia: Secondary | ICD-10-CM | POA: Diagnosis not present

## 2021-10-12 DIAGNOSIS — I6389 Other cerebral infarction: Secondary | ICD-10-CM

## 2021-10-12 DIAGNOSIS — I639 Cerebral infarction, unspecified: Secondary | ICD-10-CM | POA: Diagnosis not present

## 2021-10-12 DIAGNOSIS — I63413 Cerebral infarction due to embolism of bilateral middle cerebral arteries: Secondary | ICD-10-CM | POA: Diagnosis not present

## 2021-10-12 DIAGNOSIS — R569 Unspecified convulsions: Secondary | ICD-10-CM | POA: Diagnosis not present

## 2021-10-12 DIAGNOSIS — E440 Moderate protein-calorie malnutrition: Secondary | ICD-10-CM | POA: Diagnosis present

## 2021-10-12 DIAGNOSIS — I472 Ventricular tachycardia, unspecified: Secondary | ICD-10-CM | POA: Diagnosis not present

## 2021-10-12 DIAGNOSIS — I43 Cardiomyopathy in diseases classified elsewhere: Secondary | ICD-10-CM | POA: Diagnosis present

## 2021-10-12 DIAGNOSIS — I169 Hypertensive crisis, unspecified: Secondary | ICD-10-CM | POA: Diagnosis present

## 2021-10-12 DIAGNOSIS — I7121 Aneurysm of the ascending aorta, without rupture: Secondary | ICD-10-CM | POA: Diagnosis present

## 2021-10-12 DIAGNOSIS — G9340 Encephalopathy, unspecified: Secondary | ICD-10-CM | POA: Diagnosis present

## 2021-10-12 DIAGNOSIS — D539 Nutritional anemia, unspecified: Secondary | ICD-10-CM | POA: Diagnosis present

## 2021-10-12 DIAGNOSIS — R29702 NIHSS score 2: Secondary | ICD-10-CM | POA: Diagnosis present

## 2021-10-12 DIAGNOSIS — I428 Other cardiomyopathies: Secondary | ICD-10-CM | POA: Diagnosis present

## 2021-10-12 DIAGNOSIS — E785 Hyperlipidemia, unspecified: Secondary | ICD-10-CM | POA: Diagnosis present

## 2021-10-12 DIAGNOSIS — H5702 Anisocoria: Secondary | ICD-10-CM | POA: Diagnosis present

## 2021-10-12 DIAGNOSIS — I2510 Atherosclerotic heart disease of native coronary artery without angina pectoris: Secondary | ICD-10-CM | POA: Diagnosis present

## 2021-10-12 DIAGNOSIS — I6340 Cerebral infarction due to embolism of unspecified cerebral artery: Secondary | ICD-10-CM | POA: Diagnosis present

## 2021-10-12 DIAGNOSIS — I5033 Acute on chronic diastolic (congestive) heart failure: Secondary | ICD-10-CM | POA: Diagnosis not present

## 2021-10-12 DIAGNOSIS — I4891 Unspecified atrial fibrillation: Secondary | ICD-10-CM | POA: Diagnosis present

## 2021-10-12 DIAGNOSIS — I119 Hypertensive heart disease without heart failure: Secondary | ICD-10-CM | POA: Diagnosis present

## 2021-10-12 DIAGNOSIS — Z681 Body mass index (BMI) 19 or less, adult: Secondary | ICD-10-CM | POA: Diagnosis not present

## 2021-10-12 DIAGNOSIS — I5023 Acute on chronic systolic (congestive) heart failure: Secondary | ICD-10-CM | POA: Diagnosis present

## 2021-10-12 DIAGNOSIS — I509 Heart failure, unspecified: Secondary | ICD-10-CM | POA: Diagnosis not present

## 2021-10-12 DIAGNOSIS — D509 Iron deficiency anemia, unspecified: Secondary | ICD-10-CM | POA: Diagnosis present

## 2021-10-12 DIAGNOSIS — Z20822 Contact with and (suspected) exposure to covid-19: Secondary | ICD-10-CM | POA: Diagnosis present

## 2021-10-12 LAB — CBC
HCT: 46.2 % (ref 39.0–52.0)
Hemoglobin: 15 g/dL (ref 13.0–17.0)
MCH: 31.2 pg (ref 26.0–34.0)
MCHC: 32.5 g/dL (ref 30.0–36.0)
MCV: 96 fL (ref 80.0–100.0)
Platelets: 197 10*3/uL (ref 150–400)
RBC: 4.81 MIL/uL (ref 4.22–5.81)
RDW: 15.2 % (ref 11.5–15.5)
WBC: 8.2 10*3/uL (ref 4.0–10.5)
nRBC: 0 % (ref 0.0–0.2)

## 2021-10-12 LAB — PROTIME-INR
INR: 1.3 — ABNORMAL HIGH (ref 0.8–1.2)
Prothrombin Time: 16.1 seconds — ABNORMAL HIGH (ref 11.4–15.2)

## 2021-10-12 LAB — COMPREHENSIVE METABOLIC PANEL
ALT: 25 U/L (ref 0–44)
AST: 26 U/L (ref 15–41)
Albumin: 2.5 g/dL — ABNORMAL LOW (ref 3.5–5.0)
Alkaline Phosphatase: 58 U/L (ref 38–126)
Anion gap: 9 (ref 5–15)
BUN: 24 mg/dL — ABNORMAL HIGH (ref 6–20)
CO2: 27 mmol/L (ref 22–32)
Calcium: 8.4 mg/dL — ABNORMAL LOW (ref 8.9–10.3)
Chloride: 104 mmol/L (ref 98–111)
Creatinine, Ser: 1.82 mg/dL — ABNORMAL HIGH (ref 0.61–1.24)
GFR, Estimated: 43 mL/min — ABNORMAL LOW (ref >60)
Glucose, Bld: 123 mg/dL — ABNORMAL HIGH (ref 70–99)
Potassium: 3.2 mmol/L — ABNORMAL LOW (ref 3.5–5.1)
Sodium: 140 mmol/L (ref 135–145)
Total Bilirubin: 1.3 mg/dL — ABNORMAL HIGH (ref 0.3–1.2)
Total Protein: 5.2 g/dL — ABNORMAL LOW (ref 6.5–8.1)

## 2021-10-12 LAB — HIV ANTIBODY (ROUTINE TESTING W REFLEX): HIV Screen 4th Generation wRfx: NONREACTIVE

## 2021-10-12 LAB — LIPID PANEL
Cholesterol: 167 mg/dL (ref 0–200)
HDL: 45 mg/dL (ref >40)
LDL Cholesterol: 114 mg/dL — ABNORMAL HIGH (ref 0–99)
Total CHOL/HDL Ratio: 3.7 RATIO
Triglycerides: 40 mg/dL (ref <150)
VLDL: 8 mg/dL (ref 0–40)

## 2021-10-12 LAB — FOLATE: Folate: 10.9 ng/mL (ref >5.9)

## 2021-10-12 LAB — ECHOCARDIOGRAM COMPLETE BUBBLE STUDY
AR max vel: 1.54 cm2
AV Area VTI: 1.34 cm2
AV Area mean vel: 1.29 cm2
AV Mean grad: 5 mmHg
AV Peak grad: 8.1 mmHg
Ao pk vel: 1.42 m/s
MV M vel: 4.24 m/s
MV Peak grad: 71.9 mmHg
Radius: 0.7 cm
S' Lateral: 6.2 cm

## 2021-10-12 LAB — HEPARIN LEVEL (UNFRACTIONATED)
Heparin Unfractionated: 0.71 IU/mL — ABNORMAL HIGH (ref 0.30–0.70)
Heparin Unfractionated: 0.51 IU/mL (ref 0.30–0.70)

## 2021-10-12 LAB — VITAMIN B12: Vitamin B-12: 899 pg/mL (ref 180–914)

## 2021-10-12 MED ORDER — DM-GUAIFENESIN ER 30-600 MG PO TB12
1.0000 | ORAL_TABLET | Freq: Two times a day (BID) | ORAL | Status: DC
  Administered 2021-10-12 – 2021-10-14 (×5): 1 via ORAL
  Filled 2021-10-12 (×6): qty 1

## 2021-10-12 MED ORDER — POTASSIUM CHLORIDE CRYS ER 20 MEQ PO TBCR: 40.0000 meq | EXTENDED_RELEASE_TABLET | Freq: Two times a day (BID) | ORAL | Status: DC

## 2021-10-12 MED ORDER — FUROSEMIDE 10 MG/ML IJ SOLN
80.0000 mg | Freq: Two times a day (BID) | INTRAMUSCULAR | Status: DC
  Administered 2021-10-12 – 2021-10-19 (×14): 80 mg via INTRAVENOUS
  Filled 2021-10-12 (×14): qty 8

## 2021-10-12 MED ORDER — PERFLUTREN LIPID MICROSPHERE
1.0000 mL | INTRAVENOUS | Status: AC | PRN
  Administered 2021-10-12: 2 mL via INTRAVENOUS
  Filled 2021-10-12: qty 10

## 2021-10-12 MED ORDER — LOSARTAN POTASSIUM 25 MG PO TABS
25.0000 mg | ORAL_TABLET | Freq: Every day | ORAL | Status: DC
  Administered 2021-10-12 – 2021-10-13 (×2): 25 mg via ORAL
  Filled 2021-10-12 (×2): qty 1

## 2021-10-12 MED ORDER — ROSUVASTATIN CALCIUM 20 MG PO TABS
20.0000 mg | ORAL_TABLET | Freq: Every day | ORAL | Status: DC
  Administered 2021-10-12 – 2021-10-25 (×14): 20 mg via ORAL
  Filled 2021-10-12 (×14): qty 1

## 2021-10-12 NOTE — Consult Note (Signed)
Neurology Consultation Reason for Consult: Stroke Referring Physician: Aldine Contes  CC: Altered mental status  History is obtained from: Patient  HPI: Oscar Rosales is a 56 y.o. male with a history of heart failure, hypertension who presents with altered mental status that started abruptly while at work.  He states that he had noticed some vision difficulty that it started the day before, but had not thought much of it.  Then he passed out at work, and came in to the emergency department where it was noted that he had anisocoria.  CT was negative, MRI shows multifocal ischemic infarcts.   LKW: 1/26 tpa given?: no, outside window   ROS:  Unable to obtain due to altered mental status.   Past Medical History:  Diagnosis Date   CHF (congestive heart failure) (HCC)    Hypertension    Renal disorder      History reviewed. No pertinent family history.   Social History:  reports that he has never smoked. He has never used smokeless tobacco. He reports that he does not use drugs. No history on file for alcohol use.   Exam: Current vital signs: BP (!) 156/118 (BP Location: Left Arm)    Pulse 90    Temp 97.8 F (36.6 C) (Oral)    Resp 17    Ht 5\' 11"  (1.803 m)    Wt 77.1 kg    SpO2 92%    BMI 23.71 kg/m  Vital signs in last 24 hours: Temp:  [96 F (35.6 C)-98.3 F (36.8 C)] 97.8 F (36.6 C) (01/27 2240) Pulse Rate:  [62-94] 90 (01/27 2240) Resp:  [9-33] 17 (01/27 2240) BP: (152-167)/(106-127) 156/118 (01/27 2240) SpO2:  [92 %-100 %] 92 % (01/27 2240) Weight:  [77.1 kg] 77.1 kg (01/27 1435)   Physical Exam  Constitutional: Appears well-developed and well-nourished.   Neuro: Mental Status: Patient is lethargic, but arousable, he gives the month as March, year is 2003 Cranial Nerves: II: He does not cooperate with visual field testing, but he does fixate and track at times.  Left pupil is approximately 2 mm larger than the right, both are reactive III,IV, VI: He has  bilateral internuclear ophthalmoplegia VII: Facial movement is symmetric.  VIII: hearing is intact to voice X: Uvula elevates symmetrically XI: Shoulder shrug is symmetric. XII: tongue is midline without atrophy or fasciculations.  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  In the setting of his multifocal pulmonary emboli, paradoxical embolization may need to be considered, though given a history of ejection fractions as low as 15% (most recent was 30 to 35%) low EF could be a source of embolus as well.  He will need further work-up for embolic source.  There is a risk of hemorrhagic conversion, but in the setting of acute pulmonary embolism, I do think it is reasonable to continue anticoagulation with stroke protocol heparin dosing.  Recommendations: - HgbA1c, fasting lipid panel - Frequent neuro checks -  Echocardiogram - Prophylactic therapy-currently anticoagulated with heparin - Risk factor modification - Telemetry monitoring - PT consult, OT consult, Speech consult - Stroke team to follow  Roland Rack, MD [facility] [phone]  If 7pm- 7am, please page neurology on call as listed in [place].

## 2021-10-12 NOTE — Progress Notes (Addendum)
HD#0 Subjective:  Overnight Events: NA  Patient is seen at bedside after his echocardiogram.  Initially patient appears fatigue and somnolent.  He is arousable to verbal stimulation and able to focus and follow commands.  [name] that he is hungry.  Objective:  Vital signs in last 24 hours: Vitals:   10/11/21 2136 10/11/21 2200 10/11/21 2240 10/12/21 0725  BP: (!) 157/118 (!) 157/114 (!) 156/118 (!) 161/118  Pulse: 78 82 90 86  Resp: (!) 31 10 17 13   Temp: 98.3 F (36.8 C)  97.8 F (36.6 C) 97.7 F (36.5 C)  TempSrc: Temporal  Oral Oral  SpO2: 98% 98% 92% 96%  Weight:      Height:       Supplemental O2: Nasal Cannula SpO2: 96 % O2 Flow Rate (L/min): 2 L/min   Physical Exam:  Physical Exam Constitutional:      General: He is in acute distress.     Appearance: He is ill-appearing.  HENT:     Head: Normocephalic.  Eyes:     General:        Right eye: No discharge.        Left eye: No discharge.  Cardiovascular:     Rate and Rhythm: Regular rhythm. Tachycardia present.     Comments: +2 edema bilateral lower extremities Pulmonary:     Effort: Respiratory distress present.     Comments: Respiratory distress due to coughing Abdominal:     General: Bowel sounds are normal.  Musculoskeletal:     Comments: Bilateral feet are cool to touch  Skin:    General: Skin is warm.  Neurological:     Mental Status: He is alert.     Comments: Dilated pupil on the left side Normal strength and range of motion of left upper and lower extremity.  Slight weaker strength of the right side but not significant. Noted expressive aphasia Cranial nerves intact Diminished cerebellar testing on the left (rapid hand flipping)    Filed Weights   10/11/21 1435  Weight: 77.1 kg     Intake/Output Summary (Last 24 hours) at 10/12/2021 1102 Last data filed at 10/11/2021 1454 Gross per 24 hour  Intake --  Output 350 ml  Net -350 ml   Net IO Since Admission: -350 mL [10/12/21  1102]  Pertinent Labs: CBC Latest Ref Rng & Units 10/12/2021 10/11/2021 10/11/2021  WBC 4.0 - 10.5 K/uL 8.2 - -  Hemoglobin 13.0 - 17.0 g/dL 15.0 14.6 15.0  Hematocrit 39.0 - 52.0 % 46.2 43.0 44.0  Platelets 150 - 400 K/uL 197 - -    CMP Latest Ref Rng & Units 10/12/2021 10/11/2021 10/11/2021  Glucose 70 - 99 mg/dL 123(H) - 91  BUN 6 - 20 mg/dL 24(H) - 34(H)  Creatinine 0.61 - 1.24 mg/dL 1.82(H) - 1.90(H)  Sodium 135 - 145 mmol/L 140 139 139  Potassium 3.5 - 5.1 mmol/L 3.2(L) 3.5 3.5  Chloride 98 - 111 mmol/L 104 - 102  CO2 22 - 32 mmol/L 27 - -  Calcium 8.9 - 10.3 mg/dL 8.4(L) - -  Total Protein 6.5 - 8.1 g/dL 5.2(L) - -  Total Bilirubin 0.3 - 1.2 mg/dL 1.3(H) - -  Alkaline Phos 38 - 126 U/L 58 - -  AST 15 - 41 U/L 26 - -  ALT 0 - 44 U/L 25 - -    Imaging: CT ANGIO HEAD NECK W WO CM  Result Date: 10/11/2021 CLINICAL DATA:  Altered mental status EXAM: CT ANGIOGRAPHY  HEAD AND NECK TECHNIQUE: Multidetector CT imaging of the head and neck was performed using the standard protocol during bolus administration of intravenous contrast. Multiplanar CT image reconstructions and MIPs were obtained to evaluate the vascular anatomy. Carotid stenosis measurements (when applicable) are obtained utilizing NASCET criteria, using the distal internal carotid diameter as the denominator. RADIATION DOSE REDUCTION: This exam was performed according to the departmental dose-optimization program which includes automated exposure control, adjustment of the mA and/or kV according to patient size and/or use of iterative reconstruction technique. CONTRAST:  117mL OMNIPAQUE IOHEXOL 350 MG/ML SOLN COMPARISON:  Head CT earlier same day FINDINGS: CT HEAD Brain: There is no acute intracranial hemorrhage, mass effect, or edema. No new loss of gray-white differentiation. Stable findings of probable chronic microvascular ischemic changes in the cerebral white matter. There is no extra-axial fluid collection. Ventricles and  sulci are stable in size and configuration. Vascular: There are evaluated on CTA portion. Skull: Calvarium is unremarkable. Sinuses/Orbits: No acute finding. Other: None. Review of the MIP images confirms the above findings CTA NECK Aortic arch: Dilatation of the included ascending thoracic aorta measuring 4.2 cm. This is unchanged from prior chest CT. Great vessel origins are patent. Direct origin of the left vertebral from the arch. Right carotid system: Patent. No stenosis or evidence of dissection. Left carotid system: Patent.  No stenosis or evidence of dissection. Vertebral arteries: Patent. Right vertebral is dominant. No stenosis or evidence of dissection. Skeleton: Cervical spine degenerative changes better evaluated on same day dedicated imaging. Other neck: Unremarkable. Upper chest: Interlobular septal thickening. Chest is dictated separately. Review of the MIP images confirms the above findings CTA HEAD Anterior circulation: Intracranial internal carotid arteries are patent with mild atherosclerotic irregularity and plaque but no significant stenosis. Anterior and middle cerebral arteries are patent. Posterior circulation: Intracranial vertebral arteries, basilar artery, and posterior cerebral arteries are patent. Venous sinuses: Patent as allowed by contrast bolus timing. Review of the MIP images confirms the above findings IMPRESSION: No acute intracranial abnormality. No large vessel occlusion, hemodynamically significant stenosis, or evidence of dissection. 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 Annual imaging follow-up is recommended by CTA or MRA. Electronically Signed   By: Macy Mis M.D.   On: 10/11/2021 15:30   CT HEAD WO CONTRAST  Result Date: 10/11/2021 CLINICAL DATA:  Trauma. EXAM: CT HEAD WITHOUT CONTRAST CT MAXILLOFACIAL WITHOUT CONTRAST CT CERVICAL SPINE WITHOUT CONTRAST TECHNIQUE: Multidetector CT imaging of the head, cervical spine, and  maxillofacial structures were performed using the standard protocol without intravenous contrast. Multiplanar CT image reconstructions of the cervical spine and maxillofacial structures were also generated. RADIATION DOSE REDUCTION: This exam was performed according to the departmental dose-optimization program which includes automated exposure control, adjustment of the mA and/or kV according to patient size and/or use of iterative reconstruction technique. COMPARISON:  March 14, 2021. FINDINGS: CT HEAD FINDINGS Brain: Mild chronic ischemic white matter disease is noted. No mass effect or midline shift is noted. Ventricular size is within normal limits. There is no evidence of mass lesion, hemorrhage or acute infarction. Vascular: No hyperdense vessel or unexpected calcification. Skull: Normal. Negative for fracture or focal lesion. Other: None. CT MAXILLOFACIAL FINDINGS Osseous: No fracture or mandibular dislocation. No destructive process. Orbits: Negative. No traumatic or inflammatory finding. Sinuses: Clear. Soft tissues: Negative. CT CERVICAL SPINE FINDINGS Alignment: Grade 1 retrolisthesis of C3-4 and C4-5 is noted secondary to moderate degenerative disc disease at these levels. Skull base and vertebrae: No  acute fracture. No primary bone lesion or focal pathologic process. Soft tissues and spinal canal: No prevertebral fluid or swelling. No visible canal hematoma. Disc levels: Moderate degenerative disc disease is noted at C3-4, C4-5 and C6-7. Upper chest: Negative. Other: None. IMPRESSION: No acute intracranial abnormality seen. No definite abnormality seen in maxillofacial region. Moderate multilevel degenerative disc disease is noted in the cervical spine. No fracture or other acute abnormality is noted in the cervical spine. Electronically Signed   By: Marijo Conception M.D.   On: 10/11/2021 13:21   CT Angio Chest PE W and/or Wo Contrast  Result Date: 10/11/2021 CLINICAL DATA:  Positive D-dimer.  Pulmonary embolism suspected. Altered mental status. EXAM: CT ANGIOGRAPHY CHEST WITH CONTRAST TECHNIQUE: Multidetector CT imaging of the chest was performed using the standard protocol during bolus administration of intravenous contrast. Multiplanar CT image reconstructions and MIPs were obtained to evaluate the vascular anatomy. RADIATION DOSE REDUCTION: This exam was performed according to the departmental dose-optimization program which includes automated exposure control, adjustment of the mA and/or kV according to patient size and/or use of iterative reconstruction technique. CONTRAST:  140mL OMNIPAQUE IOHEXOL 350 MG/ML SOLN COMPARISON:  AP chest 10/11/2021 03/14/2021, CT chest 12/28/2020 CTA chest 08/26/2019 FINDINGS: Cardiovascular: The main pulmonary artery is opacified up to 447 Hounsfield units. There are small linear filling defects suspicious for pulmonary emboli within the peripheral anterior aspect of the distal right middle lobe pulmonary artery (axial series 5, image 81) and the medial segment of the right lower lobe pulmonary artery (axial series 5, image 88). The ascending aorta measures up to 4.2 cm in caliber, mildly aneurysmal. This is unchanged from prior. There is again a conjoined origin of the right brachiocephalic and left common carotid artery off the aortic arch, a normal variant. Additional normal-variant direct takeoff of the left vertebral artery from the aortic arch is again seen. Heart size is markedly enlarged measuring up to approximately 18 cm in transverse dimension. This may be slightly increased from 16 cm on 08/26/2019. No signs of right-sided heart strain. Mediastinum/Nodes: No axillary, mediastinal, or hilar pathologically enlarged lymph nodes by CT criteria. The esophagus follows normal course of normal caliber. Lungs/Pleura: The central airways are patent. Mild bilateral upper lung centrilobular emphysematous changes. There are scattered bilateral ground-glass opacities,  greatest within the lower lungs. These are mildly increased compared to 08/26/2019. Small right pleural effusion and tiny left pleural effusion, new from prior. Chronic interlobular septal thickening within the right-greater-than-left lung bases, unchanged. Upper Abdomen: Limited arterial phase images of the upper abdomen again demonstrate a partially visualized fluid density likely cyst overlying the left kidney measuring up to 5.0 cm. Musculoskeletal: Mild-to-moderate multilevel degenerative disc changes of the thoracolumbar spine. There are flowing bridging osteophytes again compatible with DISH. Review of the MIP images confirms the above findings. IMPRESSION:: IMPRESSION: 1. Mild nonocclusive pulmonary emboli within the right middle lobe pulmonary artery and right lower lobe medial segment pulmonary artery. 2. Stable ascending aortic aneurysm measuring up to 4.2 cm. 3. Marked cardiomegaly, possibly mildly worsened from 08/26/2019. 4. Small right and trace left pleural effusions. Scattered ground-glass opacities compatible with pulmonary edema, subsegmental atelectasis, and/or a mild infectious/inflammatory process. Critical Value/emergent results IMPRESSION #1 was called by telephone at the time of interpretation on 10/11/2021 at 3:37 pm to provider Dr. Lajean Saver, who verbally acknowledged these results. Electronically Signed   By: Yvonne Kendall M.D.   On: 10/11/2021 15:44   CT CERVICAL SPINE WO CONTRAST  Result Date: 10/11/2021  CLINICAL DATA:  Trauma. EXAM: CT HEAD WITHOUT CONTRAST CT MAXILLOFACIAL WITHOUT CONTRAST CT CERVICAL SPINE WITHOUT CONTRAST TECHNIQUE: Multidetector CT imaging of the head, cervical spine, and maxillofacial structures were performed using the standard protocol without intravenous contrast. Multiplanar CT image reconstructions of the cervical spine and maxillofacial structures were also generated. RADIATION DOSE REDUCTION: This exam was performed according to the departmental  dose-optimization program which includes automated exposure control, adjustment of the mA and/or kV according to patient size and/or use of iterative reconstruction technique. COMPARISON:  March 14, 2021. FINDINGS: CT HEAD FINDINGS Brain: Mild chronic ischemic white matter disease is noted. No mass effect or midline shift is noted. Ventricular size is within normal limits. There is no evidence of mass lesion, hemorrhage or acute infarction. Vascular: No hyperdense vessel or unexpected calcification. Skull: Normal. Negative for fracture or focal lesion. Other: None. CT MAXILLOFACIAL FINDINGS Osseous: No fracture or mandibular dislocation. No destructive process. Orbits: Negative. No traumatic or inflammatory finding. Sinuses: Clear. Soft tissues: Negative. CT CERVICAL SPINE FINDINGS Alignment: Grade 1 retrolisthesis of C3-4 and C4-5 is noted secondary to moderate degenerative disc disease at these levels. Skull base and vertebrae: No acute fracture. No primary bone lesion or focal pathologic process. Soft tissues and spinal canal: No prevertebral fluid or swelling. No visible canal hematoma. Disc levels: Moderate degenerative disc disease is noted at C3-4, C4-5 and C6-7. Upper chest: Negative. Other: None. IMPRESSION: No acute intracranial abnormality seen. No definite abnormality seen in maxillofacial region. Moderate multilevel degenerative disc disease is noted in the cervical spine. No fracture or other acute abnormality is noted in the cervical spine. Electronically Signed   By: Marijo Conception M.D.   On: 10/11/2021 13:21   MR BRAIN WO CONTRAST  Result Date: 10/11/2021 CLINICAL DATA:  Delirium EXAM: MRI HEAD WITHOUT CONTRAST TECHNIQUE: Multiplanar, multiecho pulse sequences of the brain and surrounding structures were obtained without intravenous contrast. COMPARISON:  Same-day CT/CTA head and neck FINDINGS: Brain: There is patchy faint diffusion restriction in the right cerebellar hemisphere with faint  T2/FLAIR signal abnormality consistent with acute subacute to infarct. Radiograph there is faint diffusion restriction in the left cerebral peduncle, left hippocampus, and medial right occipital lobe favored to reflect subacute infarcts. There is no associated hemorrhage or mass effect. Small more acute appearing infarcts are seen in the left pre and postcentral gyri. There is no associated hemorrhage or mass effect. Background parenchymal volume is normal. The ventricles are stable in size. Patchy and confluent FLAIR signal abnormality throughout the remainder of the subcortical and periventricular white matter is nonspecific but likely reflects sequela of moderate chronic white matter microangiopathy. Remote lacunar infarcts are seen in the left cerebellar hemisphere. There is no mass lesion.  There is no midline shift Vascular: The major flow voids are present. The vasculature is assessed in full on the same day CTA head and neck. Skull and upper cervical spine: Normal marrow signal. Sinuses/Orbits: There is mild mucosal thickening in the paranasal sinuses. Globes and orbits are unremarkable. Other: None. IMPRESSION: 1. Scattered infarcts involving the right cerebellar hemisphere, left cerebral peduncle, left hippocampus, left thalamus, right occipital lobe, and left pre and postcentral gyri, some of which appear acute while others appear more subacute in chronicity. Consider embolic etiology given involvement of multiple vascular territories. 2. Background moderate chronic white matter microangiopathy. Electronically Signed   By: Valetta Mole M.D.   On: 10/11/2021 17:42   DG Pelvis Portable  Result Date: 10/11/2021 CLINICAL DATA:  Found down  EXAM: PORTABLE PELVIS 1-2 VIEWS COMPARISON:  None. FINDINGS: No recent fracture or dislocation is seen. Degenerative changes are noted in the visualized lower lumbar spine. IMPRESSION: No displaced fracture or dislocation is seen in the pelvis. Electronically Signed    By: Elmer Picker M.D.   On: 10/11/2021 13:47   DG Chest Port 1 View  Result Date: 10/11/2021 CLINICAL DATA:  Found down, difficulty breathing EXAM: PORTABLE CHEST 1 VIEW COMPARISON:  03/14/2021 FINDINGS: Transverse diameter of heart is increased. Central pulmonary vessels are more prominent. Increased interstitial and alveolar markings are seen in the parahilar regions and lower lung fields on both sides. Lateral CP angles are clear. There is no pneumothorax. IMPRESSION: Cardiomegaly. There is pulmonary vascular congestion and pulmonary edema in both lungs. Possibility of underlying pneumonia is not excluded. Electronically Signed   By: Elmer Picker M.D.   On: 10/11/2021 13:46   CT Maxillofacial Wo Contrast  Result Date: 10/11/2021 CLINICAL DATA:  Trauma. EXAM: CT HEAD WITHOUT CONTRAST CT MAXILLOFACIAL WITHOUT CONTRAST CT CERVICAL SPINE WITHOUT CONTRAST TECHNIQUE: Multidetector CT imaging of the head, cervical spine, and maxillofacial structures were performed using the standard protocol without intravenous contrast. Multiplanar CT image reconstructions of the cervical spine and maxillofacial structures were also generated. RADIATION DOSE REDUCTION: This exam was performed according to the departmental dose-optimization program which includes automated exposure control, adjustment of the mA and/or kV according to patient size and/or use of iterative reconstruction technique. COMPARISON:  March 14, 2021. FINDINGS: CT HEAD FINDINGS Brain: Mild chronic ischemic white matter disease is noted. No mass effect or midline shift is noted. Ventricular size is within normal limits. There is no evidence of mass lesion, hemorrhage or acute infarction. Vascular: No hyperdense vessel or unexpected calcification. Skull: Normal. Negative for fracture or focal lesion. Other: None. CT MAXILLOFACIAL FINDINGS Osseous: No fracture or mandibular dislocation. No destructive process. Orbits: Negative. No traumatic or  inflammatory finding. Sinuses: Clear. Soft tissues: Negative. CT CERVICAL SPINE FINDINGS Alignment: Grade 1 retrolisthesis of C3-4 and C4-5 is noted secondary to moderate degenerative disc disease at these levels. Skull base and vertebrae: No acute fracture. No primary bone lesion or focal pathologic process. Soft tissues and spinal canal: No prevertebral fluid or swelling. No visible canal hematoma. Disc levels: Moderate degenerative disc disease is noted at C3-4, C4-5 and C6-7. Upper chest: Negative. Other: None. IMPRESSION: No acute intracranial abnormality seen. No definite abnormality seen in maxillofacial region. Moderate multilevel degenerative disc disease is noted in the cervical spine. No fracture or other acute abnormality is noted in the cervical spine. Electronically Signed   By: Marijo Conception M.D.   On: 10/11/2021 13:21    Assessment/Plan:   Principal Problem:   Stroke [facility]) Active Problems:   Hypertension, uncontrolled   Non-ischemic cardiomyopathy ([place])   Stage 3 chronic kidney disease ([place])   Acute pulmonary embolism ([place])   Patient Summary: Oscar Rosales is a 56 y.o. male with a pertinent PMH of nonischemic cardiomyopathy, HFrEF (EF 25-30% 02/2020), HLD, HTN, and CKD3b admitted for acute embolic infarctions in the setting of mild nonocclusive pulmonary emboli.  Multifocal ischemic infarcts  Suspecting embolic in nature given multiple scattered infarcts.  Deficits include right upper extremity weakness, right facial droop, and word finding difficulty. Has sluggish pupillary reflexes with constricted left pupil.  Monitor for any change in neurological exam which can indicate an hemorrhagic conversion while on heparin drip. Currently working up for a possible PFO and the source of the emboli.  He is  at risk for thrombus given his severe reduced EF.  Pending echocardiogram.  He will ultimately need a TEE and cardiac monitor. - Neurology consulted, follow up on recommendations  -  Allow for permissive hypertension  - Heparin infusion - Echo pending  - Vascular US LE pending  - LDL of 114, will start Crestor 20 mg - Hgb A1c 5.2 - Telemetry - Frequent neuro checks  - PT/OT/SLP.  Watch for sign of aspiration.    Mild nonocclusive pulmonary emboli  Unclear source of emboli.  No known risk factor.  Pending ultrasound Doppler and echocardiogram. - Continue on heparin infusion - If no clear etiology, will proceed with hypercoagulable study  AoC HFrEF exacerbation (EF 25-30%) Patient has nonischemic cardiomyopathy with severely reduced EF, thought was due to hypotension.  Autoimmune and amyloid test was negative.  Last EF 25-30% in 2021.  He is volume overloaded on exam.  He did not have good urine output with Lasix 40 mg, will increase to IV Lasix 80 mg BID. - With his severe reduced EF, will watch closely for signs of cardiogenic shock - Pending Echo - Daily weights and I/O's - Cardiac monitoring   HTN Uncontrolled HTN in the setting of medication non-compliance. Regimen includes Coreg, hydralazine, and losartan.  - Holding BP medications to allow for permissive hypertension   CKD3b Cr 1.88, near baseline of 1.6-1.8 - Trend BMP - Avoid nephrotoxic agents  -Replete potassium   Macrocytosis Stable hgb 14.1, with MCV 100.2.  B12 and folate normal   Stable ascending thoracic aortic dilatation  4.2 cm in size.  - Annual imaging follow-up recommended by CTA or MRA    Best Practice: Diet: Dysphagia 3  IVF: None,None VTE: Heparin Code: Full PT/OT: pending heparin lv to be therapeutic  Gaylan Gerold, DO 10/12/2021, 11:02 AM Pager: [phone]  Please contact the on call pager after 5 pm and on weekends at [phone].

## 2021-10-12 NOTE — Evaluation (Signed)
Speech Language Pathology Evaluation Patient Details Name: Oscar Rosales MRN: [id number] DOB: 1966-04-11 Today's Date: 10/12/2021 Time: 1001-1015 SLP Time Calculation (min) (ACUTE ONLY): 14 min  Problem List:  Patient Active Problem List   Diagnosis Date Noted   Stroke ([place]) 10/11/2021   Noncompliance with medication regimen 02/15/2021   Hx of iron deficiency 03/26/2020   Dyslipidemia 04/18/2019   Non-ischemic cardiomyopathy ([place]) 06/13/2017   Stage 3 chronic kidney disease ([place]) 04/28/2017   Coronary artery disease involving native coronary artery of native heart without angina pectoris 02/16/2015   Hypertension, uncontrolled 02/16/2015   Past Medical History:  Past Medical History:  Diagnosis Date   CHF (congestive heart failure) ([place])    Hypertension    Renal disorder    Past Surgical History: History reviewed. No pertinent surgical history. HPI:  Pt is a 56 yo male presenting with AMS and an episode of LOC, found to have anisocoria. Pt had also had vision changes starting the day before admission. MRI revealed multifocal ischemic infarcts involving the right cerebellar hemisphere, left cerebral peduncle, left hippocampus, left thalamus, right occipital lobe, and left pre and postcentral gyri. PMH includes: HFrEF (EF 25-30% 02/2020), HLD, HTN, CAD, and CKD3b   Assessment / Plan / Recommendation Clinical Impression  Pt presents with cognitive impairments, heavily distracted both by internal distractors as well as other provider in room. He has limited sustained attention, storage/recall of new information (1/5 on delayed recall test), and simple verbal problem solving. He's oriented to person and time with self-corrections, but thinks he is in [place]. He is tangential, answering questions off topic and perseverating on previous questions. His speech is mildly slurred and breathy. Pt was working and more independent PTA with acute infarcts noted on MRI. Recommend ongoing SLP f/u  acutely and at next level of care.    SLP Assessment  SLP Recommendation/Assessment: Patient needs continued Speech [facility] SLP Visit Diagnosis: Cognitive communication deficit (R41.841)    Recommendations for follow up therapy are one component of a multi-disciplinary discharge planning process, led by the attending physician.  Recommendations may be updated based on patient status, additional functional criteria and insurance authorization.    Follow Up Recommendations  Acute inpatient rehab (3hours/day)    Assistance Recommended at Discharge  Intermittent Supervision/Assistance  Functional Status Assessment Patient has had a recent decline in their functional status and demonstrates the ability to make significant improvements in function in a reasonable and predictable amount of time.  Frequency and Duration min 2x/week  2 weeks      SLP Evaluation Cognition  Overall Cognitive Status: Impaired/Different from baseline Arousal/Alertness: Awake/alert Orientation Level: Oriented to person;Oriented to time;Disoriented to place;Disoriented to situation Attention: Sustained Sustained Attention: Impaired Sustained Attention Impairment: Verbal basic Memory: Impaired Memory Impairment: Storage deficit;Retrieval deficit;Decreased recall of new information Awareness: Impaired Awareness Impairment: Intellectual impairment Problem Solving: Impaired Problem Solving Impairment: Verbal complex Safety/Judgment: Impaired       Comprehension  Auditory Comprehension Overall Auditory Comprehension: Impaired Conversation: Simple Interfering Components: Attention;Working Marine scientist    Expression Expression Primary Mode of Expression: Verbal Verbal Expression Overall Verbal Expression: Appears within functional limits for tasks assessed   Oral / Motor  Motor Speech Overall Motor Speech: Impaired Respiration: Impaired Level of Impairment: Sentence Phonation:  Normal Resonance: Within functional limits Articulation: Impaired Level of Impairment: Sentence Intelligibility: Intelligibility reduced Word: 75-100% accurate Phrase: 50-74% accurate Sentence: 50-74% accurate            Osie Bond., M.A. [facility]  Pager [phone] Office [phone]  10/12/2021, 10:25 AM

## 2021-10-12 NOTE — Progress Notes (Signed)
ANTICOAGULATION CONSULT NOTE   Pharmacy Consult for IV heparin Indication: pulmonary embolus  Allergies  Allergen Reactions   Lisinopril Cough    SWITCHED TO LOSARTAN    Patient Measurements: Height: 5\' 11"  (180.3 cm) Weight: 77.1 kg (170 lb) IBW/kg (Calculated) : 75.3 Heparin Dosing Weight: 77.1kg  Vital Signs: Temp: 97.7 F (36.5 C) (01/28 0725) Temp Source: Oral (01/28 0725) BP: 161/118 (01/28 0725) Pulse Rate: 86 (01/28 0725)  Labs: Recent Labs    10/11/21 1330 10/11/21 1338 10/11/21 1644 10/11/21 2256 10/12/21 0117 10/12/21 0758  HGB 14.1 14.6   15.0  --   --  15.0  --   HCT 45.3 43.0   44.0  --   --  46.2  --   PLT 165  --   --   --  197  --   LABPROT 15.7*  --   --   --  16.1*  --   INR 1.3*  --   --   --  1.3*  --   HEPARINUNFRC  --   --   --  0.87*  --  0.71*  CREATININE 1.88* 1.90*  --   --  1.82*  --   CKTOTAL 290  --   --   --   --   --   TROPONINIHS 48*  --  50*  --   --   --      Estimated Creatinine Clearance: 48.3 mL/min (A) (by C-G formula based on SCr of 1.82 mg/dL (H)).   Medical History: Past Medical History:  Diagnosis Date   CHF (congestive heart failure) ([facility])    Hypertension    Renal disorder    Assessment: 25 YOM  presenting to the ED after found unresponsive at work. No history of anticoagulation prior to admission. CT angio positive for mild nonocclusive PE in the right lobe. Pharmacy to dose IV heparin. Heparin level this AM is 0.71 which is slightly supratherapeutic.  H/H is stable and within normal limits, platelets are within normal limits. Renal function is okay.  Goal of Therapy:  Heparin level 0.3-0.7 units/ml Monitor platelets by anticoagulation protocol: Yes   Plan:  Decrease heparin to 1100 units/hr  1530 HL  Monitor daily HL and CBC   58, PharmD, [facility] Pharmacy Resident [phone] 10/12/2021 8:59 AM

## 2021-10-12 NOTE — Progress Notes (Signed)
OT Cancellation Note  Patient Details Name: Oscar Rosales MRN: [id number] DOB: May 23, 1966   Cancelled Treatment:    Reason Eval/Treat Not Completed: Patient not medically ready (New PE, OT evaluation to f/u tomorrow after therapeutic anticoagulation.)  Lyberti Thrush A Lorea Kupfer 10/12/2021, 9:46 AM

## 2021-10-12 NOTE — Progress Notes (Signed)
Bilateral lower extremity venous duplex has been completed. Preliminary results can be found in CV Proc through chart review.   10/12/21 11:49 AM Olen Cordial RVT

## 2021-10-12 NOTE — Progress Notes (Signed)
ANTICOAGULATION CONSULT NOTE   Pharmacy Consult for IV heparin Indication: pulmonary embolus  Allergies  Allergen Reactions   Lisinopril Cough    SWITCHED TO LOSARTAN    Patient Measurements: Height: 5\' 11"  (180.3 cm) Weight: 77.1 kg (170 lb) IBW/kg (Calculated) : 75.3 Heparin Dosing Weight: 77.1kg  Vital Signs: Temp: 97.5 F (36.4 C) (01/28 1200) Temp Source: Oral (01/28 1200) BP: 157/124 (01/28 1200) Pulse Rate: 87 (01/28 1200)  Labs: Recent Labs    10/11/21 1330 10/11/21 1338 10/11/21 1644 10/11/21 2256 10/12/21 0117 10/12/21 0758 10/12/21 1520  HGB 14.1 14.6   15.0  --   --  15.0  --   --   HCT 45.3 43.0   44.0  --   --  46.2  --   --   PLT 165  --   --   --  197  --   --   LABPROT 15.7*  --   --   --  16.1*  --   --   INR 1.3*  --   --   --  1.3*  --   --   HEPARINUNFRC  --   --   --  0.87*  --  0.71* 0.51  CREATININE 1.88* 1.90*  --   --  1.82*  --   --   CKTOTAL 290  --   --   --   --   --   --   TROPONINIHS 48*  --  50*  --   --   --   --      Estimated Creatinine Clearance: 48.3 mL/min (A) (by C-G formula based on SCr of 1.82 mg/dL (H)).   Medical History: Past Medical History:  Diagnosis Date   CHF (congestive heart failure) ([place])    Hypertension    Renal disorder    Assessment: 7 YOM  presenting to the ED after found unresponsive at work. No history of anticoagulation prior to admission. CT angio positive for mild nonocclusive PE in the right lobe. Pharmacy to dose IV heparin.   H/H is stable and within normal limits, platelets are within normal limits. Renal function is okay.  Heparin level this afternoon is within goal range after rate adjustment this AM.  No overt bleeding or complications noted.  Goal of Therapy:  Heparin level 0.3-0.7 units/ml Monitor platelets by anticoagulation protocol: Yes   Plan:  Continue IV heparin at 1100 units/hr. Daily heparin level and CBC.  [place] Crane, Roylene Reason, BCCP Clinical Pharmacist   10/12/2021 4:13 PM   [facility] pharmacy phone numbers are listed on [email or web address]

## 2021-10-12 NOTE — Progress Notes (Addendum)
PT Cancellation Note  Patient Details Name: Oscar Rosales MRN: [id number] DOB: 17-Nov-1965   Cancelled Treatment:    Reason Eval/Treat Not Completed: Patient not medically ready. He was found to have a new PE and needs to be on anticoagulation for 24 hours. Will attempt again another day.   Armanda Heritage, SPT   Armanda Heritage 10/12/2021, 1:16 PM

## 2021-10-12 NOTE — Progress Notes (Addendum)
STROKE TEAM PROGRESS NOTE   INTERVAL HISTORY No family at the bedside. Internal med team at the bedside. Discussed plan for echo, venous duplex, and TEE. Patient has tachypnea and is tachycardic in the low 100s, on heparin drip. Keep BP under systolic [id number] r/t heparin gtt for PE and cardiac function.   Vitals:   10/11/21 2136 10/11/21 2200 10/11/21 2240 10/12/21 0725  BP: (!) 157/118 (!) 157/114 (!) 156/118 (!) 161/118  Pulse: 78 82 90 86  Resp: (!) 31 10 17 13   Temp: 98.3 F (36.8 C)  97.8 F (36.6 C) 97.7 F (36.5 C)  TempSrc: Temporal  Oral Oral  SpO2: 98% 98% 92% 96%  Weight:      Height:       CBC:  Recent Labs  Lab 10/11/21 1330 10/11/21 1338 10/12/21 0117  WBC 6.5  --  8.2  HGB 14.1 14.6   15.0 15.0  HCT 45.3 43.0   44.0 46.2  MCV 100.2*  --  96.0  PLT 165  --  [id number]   Basic Metabolic Panel:  Recent Labs  Lab 10/11/21 1330 10/11/21 1338 10/12/21 0117  NA 139 139   139 140  K 3.4* 3.5   3.5 3.2*  CL 103 102 104  CO2 24  --  27  GLUCOSE 93 91 123*  BUN 27* 34* 24*  CREATININE 1.88* 1.90* 1.82*  CALCIUM 8.3*  --  8.4*   Lipid Panel:  Recent Labs  Lab 10/12/21 0117  CHOL 167  TRIG 40  HDL 45  CHOLHDL 3.7  VLDL 8  LDLCALC 114*   HgbA1c:  Recent Labs  Lab 10/11/21 2256  HGBA1C 5.2   Urine Drug Screen:  Recent Labs  Lab 10/11/21 1358  LABOPIA NONE DETECTED  COCAINSCRNUR NONE DETECTED  LABBENZ NONE DETECTED  AMPHETMU NONE DETECTED  THCU NONE DETECTED  LABBARB NONE DETECTED    Alcohol Level  Recent Labs  Lab 10/11/21 1330  ETH <10    IMAGING past 24 hours CT ANGIO HEAD NECK W WO CM  Result Date: 10/11/2021 CLINICAL DATA:  Altered mental status EXAM: CT ANGIOGRAPHY HEAD AND NECK TECHNIQUE: Multidetector CT imaging of the head and neck was performed using the standard protocol during bolus administration of intravenous contrast. Multiplanar CT image reconstructions and MIPs were obtained to evaluate the vascular anatomy. Carotid stenosis  measurements (when applicable) are obtained utilizing NASCET criteria, using the distal internal carotid diameter as the denominator. RADIATION DOSE REDUCTION: This exam was performed according to the departmental dose-optimization program which includes automated exposure control, adjustment of the mA and/or kV according to patient size and/or use of iterative reconstruction technique. CONTRAST:  172mL OMNIPAQUE IOHEXOL 350 MG/ML SOLN COMPARISON:  Head CT earlier same day FINDINGS: CT HEAD Brain: There is no acute intracranial hemorrhage, mass effect, or edema. No new loss of gray-white differentiation. Stable findings of probable chronic microvascular ischemic changes in the cerebral white matter. There is no extra-axial fluid collection. Ventricles and sulci are stable in size and configuration. Vascular: There are evaluated on CTA portion. Skull: Calvarium is unremarkable. Sinuses/Orbits: No acute finding. Other: None. Review of the MIP images confirms the above findings CTA NECK Aortic arch: Dilatation of the included ascending thoracic aorta measuring 4.2 cm. This is unchanged from prior chest CT. Great vessel origins are patent. Direct origin of the left vertebral from the arch. Right carotid system: Patent. No stenosis or evidence of dissection. Left carotid system: Patent.  No stenosis or evidence of  dissection. Vertebral arteries: Patent. Right vertebral is dominant. No stenosis or evidence of dissection. Skeleton: Cervical spine degenerative changes better evaluated on same day dedicated imaging. Other neck: Unremarkable. Upper chest: Interlobular septal thickening. Chest is dictated separately. Review of the MIP images confirms the above findings CTA HEAD Anterior circulation: Intracranial internal carotid arteries are patent with mild atherosclerotic irregularity and plaque but no significant stenosis. Anterior and middle cerebral arteries are patent. Posterior circulation: Intracranial vertebral  arteries, basilar artery, and posterior cerebral arteries are patent. Venous sinuses: Patent as allowed by contrast bolus timing. Review of the MIP images confirms the above findings IMPRESSION: No acute intracranial abnormality. No large vessel occlusion, hemodynamically significant stenosis, or evidence of dissection. 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 Annual imaging follow-up is recommended by CTA or MRA. Electronically Signed   By: Guadlupe Spanish M.D.   On: 10/11/2021 15:30   CT HEAD WO CONTRAST  Result Date: 10/11/2021 CLINICAL DATA:  Trauma. EXAM: CT HEAD WITHOUT CONTRAST CT MAXILLOFACIAL WITHOUT CONTRAST CT CERVICAL SPINE WITHOUT CONTRAST TECHNIQUE: Multidetector CT imaging of the head, cervical spine, and maxillofacial structures were performed using the standard protocol without intravenous contrast. Multiplanar CT image reconstructions of the cervical spine and maxillofacial structures were also generated. RADIATION DOSE REDUCTION: This exam was performed according to the departmental dose-optimization program which includes automated exposure control, adjustment of the mA and/or kV according to patient size and/or use of iterative reconstruction technique. COMPARISON:  March 14, 2021. FINDINGS: CT HEAD FINDINGS Brain: Mild chronic ischemic white matter disease is noted. No mass effect or midline shift is noted. Ventricular size is within normal limits. There is no evidence of mass lesion, hemorrhage or acute infarction. Vascular: No hyperdense vessel or unexpected calcification. Skull: Normal. Negative for fracture or focal lesion. Other: None. CT MAXILLOFACIAL FINDINGS Osseous: No fracture or mandibular dislocation. No destructive process. Orbits: Negative. No traumatic or inflammatory finding. Sinuses: Clear. Soft tissues: Negative. CT CERVICAL SPINE FINDINGS Alignment: Grade 1 retrolisthesis of C3-4 and C4-5 is noted secondary to moderate degenerative disc  disease at these levels. Skull base and vertebrae: No acute fracture. No primary bone lesion or focal pathologic process. Soft tissues and spinal canal: No prevertebral fluid or swelling. No visible canal hematoma. Disc levels: Moderate degenerative disc disease is noted at C3-4, C4-5 and C6-7. Upper chest: Negative. Other: None. IMPRESSION: No acute intracranial abnormality seen. No definite abnormality seen in maxillofacial region. Moderate multilevel degenerative disc disease is noted in the cervical spine. No fracture or other acute abnormality is noted in the cervical spine. Electronically Signed   By: Lupita Raider M.D.   On: 10/11/2021 13:21   CT Angio Chest PE W and/or Wo Contrast  Result Date: 10/11/2021 CLINICAL DATA:  Positive D-dimer. Pulmonary embolism suspected. Altered mental status. EXAM: CT ANGIOGRAPHY CHEST WITH CONTRAST TECHNIQUE: Multidetector CT imaging of the chest was performed using the standard protocol during bolus administration of intravenous contrast. Multiplanar CT image reconstructions and MIPs were obtained to evaluate the vascular anatomy. RADIATION DOSE REDUCTION: This exam was performed according to the departmental dose-optimization program which includes automated exposure control, adjustment of the mA and/or kV according to patient size and/or use of iterative reconstruction technique. CONTRAST:  OMNIPAQUE IOHEXOL 350 MG/ML SOLN COMPARISON:  AP chest 10/11/2021 03/14/2021, CT chest 12/28/2020 CTA chest 08/26/2019 FINDINGS: Cardiovascular: The main pulmonary artery is opacified up to 447 Hounsfield units. There are small linear filling defects suspicious for pulmonary emboli within  the peripheral anterior aspect of the distal right middle lobe pulmonary artery (axial series 5, image 81) and the medial segment of the right lower lobe pulmonary artery (axial series 5, image 88). The ascending aorta measures up to 4.2 cm in caliber, mildly aneurysmal. This is unchanged  from prior. There is again a conjoined origin of the right brachiocephalic and left common carotid artery off the aortic arch, a normal variant. Additional normal-variant direct takeoff of the left vertebral artery from the aortic arch is again seen. Heart size is markedly enlarged measuring up to approximately 18 cm in transverse dimension. This may be slightly increased from 16 cm on 08/26/2019. No signs of right-sided heart strain. Mediastinum/Nodes: No axillary, mediastinal, or hilar pathologically enlarged lymph nodes by CT criteria. The esophagus follows normal course of normal caliber. Lungs/Pleura: The central airways are patent. Mild bilateral upper lung centrilobular emphysematous changes. There are scattered bilateral ground-glass opacities, greatest within the lower lungs. These are mildly increased compared to 08/26/2019. Small right pleural effusion and tiny left pleural effusion, new from prior. Chronic interlobular septal thickening within the right-greater-than-left lung bases, unchanged. Upper Abdomen: Limited arterial phase images of the upper abdomen again demonstrate a partially visualized fluid density likely cyst overlying the left kidney measuring up to 5.0 cm. Musculoskeletal: Mild-to-moderate multilevel degenerative disc changes of the thoracolumbar spine. There are flowing bridging osteophytes again compatible with DISH. Review of the MIP images confirms the above findings. IMPRESSION:: IMPRESSION: 1. Mild nonocclusive pulmonary emboli within the right middle lobe pulmonary artery and right lower lobe medial segment pulmonary artery. 2. Stable ascending aortic aneurysm measuring up to 4.2 cm. 3. Marked cardiomegaly, possibly mildly worsened from 08/26/2019. 4. Small right and trace left pleural effusions. Scattered ground-glass opacities compatible with pulmonary edema, subsegmental atelectasis, and/or a mild infectious/inflammatory process. Critical Value/emergent results IMPRESSION #1  was called by telephone at the time of interpretation on 10/11/2021 at 3:37 pm to provider Dr. Lajean Saver, who verbally acknowledged these results. Electronically Signed   By: Yvonne Kendall M.D.   On: 10/11/2021 15:44   CT CERVICAL SPINE WO CONTRAST  Result Date: 10/11/2021 CLINICAL DATA:  Trauma. EXAM: CT HEAD WITHOUT CONTRAST CT MAXILLOFACIAL WITHOUT CONTRAST CT CERVICAL SPINE WITHOUT CONTRAST TECHNIQUE: Multidetector CT imaging of the head, cervical spine, and maxillofacial structures were performed using the standard protocol without intravenous contrast. Multiplanar CT image reconstructions of the cervical spine and maxillofacial structures were also generated. RADIATION DOSE REDUCTION: This exam was performed according to the departmental dose-optimization program which includes automated exposure control, adjustment of the mA and/or kV according to patient size and/or use of iterative reconstruction technique. COMPARISON:  March 14, 2021. FINDINGS: CT HEAD FINDINGS Brain: Mild chronic ischemic white matter disease is noted. No mass effect or midline shift is noted. Ventricular size is within normal limits. There is no evidence of mass lesion, hemorrhage or acute infarction. Vascular: No hyperdense vessel or unexpected calcification. Skull: Normal. Negative for fracture or focal lesion. Other: None. CT MAXILLOFACIAL FINDINGS Osseous: No fracture or mandibular dislocation. No destructive process. Orbits: Negative. No traumatic or inflammatory finding. Sinuses: Clear. Soft tissues: Negative. CT CERVICAL SPINE FINDINGS Alignment: Grade 1 retrolisthesis of C3-4 and C4-5 is noted secondary to moderate degenerative disc disease at these levels. Skull base and vertebrae: No acute fracture. No primary bone lesion or focal pathologic process. Soft tissues and spinal canal: No prevertebral fluid or swelling. No visible canal hematoma. Disc levels: Moderate degenerative disc disease is noted at C3-4, C4-5  and C6-7.  Upper chest: Negative. Other: None. IMPRESSION: No acute intracranial abnormality seen. No definite abnormality seen in maxillofacial region. Moderate multilevel degenerative disc disease is noted in the cervical spine. No fracture or other acute abnormality is noted in the cervical spine. Electronically Signed   By: Marijo Conception M.D.   On: 10/11/2021 13:21   MR BRAIN WO CONTRAST  Result Date: 10/11/2021 CLINICAL DATA:  Delirium EXAM: MRI HEAD WITHOUT CONTRAST TECHNIQUE: Multiplanar, multiecho pulse sequences of the brain and surrounding structures were obtained without intravenous contrast. COMPARISON:  Same-day CT/CTA head and neck FINDINGS: Brain: There is patchy faint diffusion restriction in the right cerebellar hemisphere with faint T2/FLAIR signal abnormality consistent with acute subacute to infarct. Radiograph there is faint diffusion restriction in the left cerebral peduncle, left hippocampus, and medial right occipital lobe favored to reflect subacute infarcts. There is no associated hemorrhage or mass effect. Small more acute appearing infarcts are seen in the left pre and postcentral gyri. There is no associated hemorrhage or mass effect. Background parenchymal volume is normal. The ventricles are stable in size. Patchy and confluent FLAIR signal abnormality throughout the remainder of the subcortical and periventricular white matter is nonspecific but likely reflects sequela of moderate chronic white matter microangiopathy. Remote lacunar infarcts are seen in the left cerebellar hemisphere. There is no mass lesion.  There is no midline shift Vascular: The major flow voids are present. The vasculature is assessed in full on the same day CTA head and neck. Skull and upper cervical spine: Normal marrow signal. Sinuses/Orbits: There is mild mucosal thickening in the paranasal sinuses. Globes and orbits are unremarkable. Other: None. IMPRESSION: 1. Scattered infarcts involving the right cerebellar  hemisphere, left cerebral peduncle, left hippocampus, left thalamus, right occipital lobe, and left pre and postcentral gyri, some of which appear acute while others appear more subacute in chronicity. Consider embolic etiology given involvement of multiple vascular territories. 2. Background moderate chronic white matter microangiopathy. Electronically Signed   By: Valetta Mole M.D.   On: 10/11/2021 17:42   DG Pelvis Portable  Result Date: 10/11/2021 CLINICAL DATA:  Found down EXAM: PORTABLE PELVIS 1-2 VIEWS COMPARISON:  None. FINDINGS: No recent fracture or dislocation is seen. Degenerative changes are noted in the visualized lower lumbar spine. IMPRESSION: No displaced fracture or dislocation is seen in the pelvis. Electronically Signed   By: Elmer Picker M.D.   On: 10/11/2021 13:47   DG Chest Port 1 View  Result Date: 10/11/2021 CLINICAL DATA:  Found down, difficulty breathing EXAM: PORTABLE CHEST 1 VIEW COMPARISON:  03/14/2021 FINDINGS: Transverse diameter of heart is increased. Central pulmonary vessels are more prominent. Increased interstitial and alveolar markings are seen in the parahilar regions and lower lung fields on both sides. Lateral CP angles are clear. There is no pneumothorax. IMPRESSION: Cardiomegaly. There is pulmonary vascular congestion and pulmonary edema in both lungs. Possibility of underlying pneumonia is not excluded. Electronically Signed   By: Elmer Picker M.D.   On: 10/11/2021 13:46   CT Maxillofacial Wo Contrast  Result Date: 10/11/2021 CLINICAL DATA:  Trauma. EXAM: CT HEAD WITHOUT CONTRAST CT MAXILLOFACIAL WITHOUT CONTRAST CT CERVICAL SPINE WITHOUT CONTRAST TECHNIQUE: Multidetector CT imaging of the head, cervical spine, and maxillofacial structures were performed using the standard protocol without intravenous contrast. Multiplanar CT image reconstructions of the cervical spine and maxillofacial structures were also generated. RADIATION DOSE REDUCTION:  This exam was performed according to the departmental dose-optimization program which includes automated exposure control, adjustment  of the mA and/or kV according to patient size and/or use of iterative reconstruction technique. COMPARISON:  March 14, 2021. FINDINGS: CT HEAD FINDINGS Brain: Mild chronic ischemic white matter disease is noted. No mass effect or midline shift is noted. Ventricular size is within normal limits. There is no evidence of mass lesion, hemorrhage or acute infarction. Vascular: No hyperdense vessel or unexpected calcification. Skull: Normal. Negative for fracture or focal lesion. Other: None. CT MAXILLOFACIAL FINDINGS Osseous: No fracture or mandibular dislocation. No destructive process. Orbits: Negative. No traumatic or inflammatory finding. Sinuses: Clear. Soft tissues: Negative. CT CERVICAL SPINE FINDINGS Alignment: Grade 1 retrolisthesis of C3-4 and C4-5 is noted secondary to moderate degenerative disc disease at these levels. Skull base and vertebrae: No acute fracture. No primary bone lesion or focal pathologic process. Soft tissues and spinal canal: No prevertebral fluid or swelling. No visible canal hematoma. Disc levels: Moderate degenerative disc disease is noted at C3-4, C4-5 and C6-7. Upper chest: Negative. Other: None. IMPRESSION: No acute intracranial abnormality seen. No definite abnormality seen in maxillofacial region. Moderate multilevel degenerative disc disease is noted in the cervical spine. No fracture or other acute abnormality is noted in the cervical spine. Electronically Signed   By: Marijo Conception M.D.   On: 10/11/2021 13:21    PHYSICAL EXAM  Physical Exam  Constitutional: Ill appearing, frail African American male.  Cardiovascular: Tachycardic and regular rhythm.  Respiratory: Labored, tachpnic GI: Soft.  No distension. There is no tenderness.  Msk:   Neuro: Mental Status: Patient is awake, alert, to self, person, and place. Some confusion on the  date, states it is 2003.  Speech is slightly dysarthric. No signs of aphasia or neglect Cranial Nerves: II: Visual Fields are full. Left anisocoria  but equally reactive. III,IV, VI: He has bilateral internuclear ophthalmoplegia VII: Facial movement is symmetric.  VIII: hearing is intact to voice X: Uvula elevates symmetrically XI: Shoulder shrug is symmetric. XII: tongue is midline without atrophy or fasciculations.  Motor: Tone is normal. Bulk is normal.  RUE 4/5   LUE 5/5 RLE 4/5 with drift LLE 5/5 Sensory: Sensation is symmetric to light touch and temperature in the arms and legs. No extinction to DSS present.  Cerebellar: Discoordination of RUE   ASSESSMENT/PLAN Mr. Jhamal Polmanteer is a 56 y.o. male with history of CHF, ascending thoracic aortic dilation, HTN, noncompliance with medications, CKD III presenting with AMS. He states that he woke up feeling tired. Went to work, and shortly after abruptly started to feel lightheaded and before he knew it, he passed out. Does not remember falling down, but woke up on ground. Unsure how long he was down, possibly 10-20 minutes. No witnesses of the the event. Someone found him down on the bathroom and called EMS. 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 He is currently on a heparin drip. MRI head shows scattered infarcts involving the right cerebellar hemisphere, left cerebral peduncle, left hippocampus, left thalamus, right occipital lobe, and left pre and postcentral gyri, some of which appear acute while others appear more subacute in chronicity. He will need echo, bil lower extremity US, TEE, cardiac monitor (30 day or loop), and possible hypercoag panel.   Stroke:  Scattered infarcts involving multiple territories likely secondary embolic source Code Stroke- No acute abnormality. Small vessel disease.  CTA head & neck No large vessel occlusion, hemodynamically significant  stenosis, or evidence of dissection. Stable aneurysmal dilatation of the  ascending thoracic aorta measuring 4.2 cm.  CXR- Cardiomegaly. There is pulmonary vascular congestion and pulmonary edema in both lungs. Possibility of underlying pneumonia is not excluded MRI- Scattered infarcts involving the right cerebellar hemisphere, left cerebral peduncle, left hippocampus, left thalamus, right occipital lobe, and left pre and postcentral gyri, some of which appear acute while others appear more subacute in chronicity. CTA Chest- Mild nonocclusive pulmonary emboli within the right middle lobe pulmonary artery and right lower lobe medial segment pulmonary artery. Marked cardiomegaly, possibly mildly worsened from 08/26/2019. Small right and trace left pleural effusions. Scattered ground-glass opacities compatible with pulmonary edema, subsegmental atelectasis, and/or a mild infectious/inflammatory process 2D Echo - BLE Venous duplex- No evidence of DVT in lower extremities LDL 114 HgbA1c 5.2 VTE prophylaxis - Heparin IV    Diet   DIET DYS 3 Room service appropriate? Yes; Fluid consistency: Thin   No antithrombotic prior to admission, now on heparin IV.  Therapy recommendations:  CIR Disposition:  Pending  Pulmonary Embolism Heparin IV protocol Management by primary team  AoC HFrEF exacerbation Home meds: Imdur, digoxin, torsemide Recent titration of torsemide r/t increasing BNP and sodium intake Echo pending  Hypertension Home meds:  hydralazine 100mg , carvedilol, clonidine 0.3 patch, cozaar Unstable- non compliant with home medication for approximately 3 months Permissive hypertension (OK if < 220/120) but gradually normalize in 5-7 days Long-term BP goal normotensive  Hyperlipidemia Home meds:  None LDL 114, goal < 70 Add Crestor  High intensity statin not indicated  Continue statin at discharge  Other Stroke Risk Factors Coronary artery disease Congestive heart failure Seen by  [facility] Consider cardiology consult   Chronic Kidney Disease- follows with [facility] nephrology Dr. Olivia Mackie Monitor I&O Creatinine baseline 1.7-2.0  Variably positive ANA  Antibody testing negative. His initial ANA was negative, retest positive, antibody screening reassuring Done outpatient at [facility] day # 0  Patient seen and examined by NP/APP with MD. MD to update note as needed.   Janine Ores, DNP, FNP-BC [facility] Pager: [phone]  ATTENDING ATTESTATION:  Dr. Reeves Forth evaluated pt independently, reviewed imaging, chart, labs. Discussed and formulated plan with the APP. Please see APP note above for details.   Total 36 minutes spent on counseling patient and coordinating care, writing notes and reviewing chart.  Same day note, no charge.    Yatzari Jonsson,MD    To contact Stroke Continuity provider, please refer to [email or web address] After hours, contact General Neurology

## 2021-10-13 DIAGNOSIS — I63413 Cerebral infarction due to embolism of bilateral middle cerebral arteries: Secondary | ICD-10-CM | POA: Diagnosis not present

## 2021-10-13 LAB — BASIC METABOLIC PANEL
Anion gap: 13 (ref 5–15)
BUN: 29 mg/dL — ABNORMAL HIGH (ref 6–20)
CO2: 24 mmol/L (ref 22–32)
Calcium: 8.4 mg/dL — ABNORMAL LOW (ref 8.9–10.3)
Chloride: 103 mmol/L (ref 98–111)
Creatinine, Ser: 2.08 mg/dL — ABNORMAL HIGH (ref 0.61–1.24)
GFR, Estimated: 37 mL/min — ABNORMAL LOW (ref >60)
Glucose, Bld: 109 mg/dL — ABNORMAL HIGH (ref 70–99)
Potassium: 3.5 mmol/L (ref 3.5–5.1)
Sodium: 140 mmol/L (ref 135–145)

## 2021-10-13 LAB — CBC
HCT: 43.8 % (ref 39.0–52.0)
Hemoglobin: 14.8 g/dL (ref 13.0–17.0)
MCH: 32.4 pg (ref 26.0–34.0)
MCHC: 33.8 g/dL (ref 30.0–36.0)
MCV: 95.8 fL (ref 80.0–100.0)
Platelets: 166 10*3/uL (ref 150–400)
RBC: 4.57 MIL/uL (ref 4.22–5.81)
RDW: 15.4 % (ref 11.5–15.5)
WBC: 11.7 10*3/uL — ABNORMAL HIGH (ref 4.0–10.5)
nRBC: 0 % (ref 0.0–0.2)

## 2021-10-13 LAB — C-REACTIVE PROTEIN: CRP: 10.9 mg/dL — ABNORMAL HIGH (ref <1.0)

## 2021-10-13 LAB — HEPARIN LEVEL (UNFRACTIONATED): Heparin Unfractionated: 0.39 IU/mL (ref 0.30–0.70)

## 2021-10-13 LAB — ANTITHROMBIN III: AntiThromb III Func: 89 % (ref 75–120)

## 2021-10-13 NOTE — Progress Notes (Addendum)
HD#1 Subjective:  Overnight Events: NA  Patient is seen at bedside after his echocardiogram.  Initially patient appears fatigue and somnolent.  He is arousable to verbal stimulation and able to focus and follow commands.   Objective:  Vital signs in last 24 hours: Vitals:   10/12/21 1200 10/12/21 1502 10/12/21 1905 10/12/21 2305  BP: (!) 157/124 (!) 155/118 (!) 155/113 (!) 136/110  Pulse: 87 89  98  Resp: 16 16 20 17   Temp: (!) 97.5 F (36.4 C) 97.6 F (36.4 C)    TempSrc: Oral Oral    SpO2: 99% 100% 100% 100%  Weight:      Height:       Supplemental O2: Nasal Cannula SpO2: 100 % O2 Flow Rate (L/min): 2 L/min   Physical Exam:  Physical Exam Constitutional:      General: He is in acute distress.     Appearance: He is ill-appearing.  HENT:     Head: Normocephalic.  Eyes:     General:        Right eye: No discharge.        Left eye: No discharge.  Cardiovascular:     Rate and Rhythm: Regular rhythm. Tachycardia present.     Comments: +2 edema bilateral lower extremities Pulmonary:     Effort: Pulmonary effort is normal.     Comments: Respiratory distress due to coughing Abdominal:     General: Bowel sounds are normal.  Musculoskeletal:     Comments: Bilateral feet are cool to touch  Skin:    General: Skin is warm.  Neurological:     Mental Status: He is alert.     Comments: Dilated pupil on the left side Normal strength and range of motion of left upper and lower extremity.  Slight weaker strength of the right upper extremity  Persistent expressive aphasia     Filed Weights   10/11/21 1435  Weight: 77.1 kg     Intake/Output Summary (Last 24 hours) at 10/13/2021 0641 Last data filed at 10/13/2021 0500 Gross per 24 hour  Intake 359.69 ml  Output 1325 ml  Net -965.31 ml    Net IO Since Admission: -1,315.31 mL [10/13/21 0641]  Pertinent Labs: CBC Latest Ref Rng & Units 10/13/2021 10/12/2021 10/11/2021  WBC 4.0 - 10.5 K/uL 11.7(H) 8.2 -  Hemoglobin  13.0 - 17.0 g/dL 14.8 15.0 14.6  Hematocrit 39.0 - 52.0 % 43.8 46.2 43.0  Platelets 150 - 400 K/uL 166 197 -    CMP Latest Ref Rng & Units 10/13/2021 10/12/2021 10/11/2021  Glucose 70 - 99 mg/dL 109(H) 123(H) -  BUN 6 - 20 mg/dL 29(H) 24(H) -  Creatinine 0.61 - 1.24 mg/dL 2.08(H) 1.82(H) -  Sodium 135 - 145 mmol/L 140 140 139  Potassium 3.5 - 5.1 mmol/L 3.5 3.2(L) 3.5  Chloride 98 - 111 mmol/L 103 104 -  CO2 22 - 32 mmol/L 24 27 -  Calcium 8.9 - 10.3 mg/dL 8.4(L) 8.4(L) -  Total Protein 6.5 - 8.1 g/dL - 5.2(L) -  Total Bilirubin 0.3 - 1.2 mg/dL - 1.3(H) -  Alkaline Phos 38 - 126 U/L - 58 -  AST 15 - 41 U/L - 26 -  ALT 0 - 44 U/L - 25 -    Imaging: ECHOCARDIOGRAM COMPLETE BUBBLE STUDY  Result Date: 10/12/2021    ECHOCARDIOGRAM REPORT   Patient Name:   ETHON CAMPBEL Date of Exam: 10/12/2021 Medical Rec #:  [id number]   Height:  71.0 in Accession #:    [id number]  Weight:       170.0 lb Date of Birth:  June 19, 1966  BSA:          1.968 m Patient Age:    56 years    BP:           159/127 mmHg Patient Gender: M           HR:           94 bpm. Exam Location:  Inpatient Procedure: 2D Echo, Cardiac Doppler, Color Doppler, Saline Contrast Bubble Study            and Intracardiac Opacification Agent Indications:    Stroke  History:        Patient has no prior history of Echocardiogram examinations.                 Arrythmias:Atrial Fibrillation, Signs/Symptoms:Shortness of                 Breath; Risk Factors:Hypertension.  Sonographer:    Merrie Roof RDCS Referring Phys: [place]  1. Left ventricular ejection fraction, by estimation, is 15-20%. The left ventricle has severely decreased function. The left ventricle demonstrates global hypokinesis. The left ventricular internal cavity size was moderately dilated. There is mild left  ventricular hypertrophy. Left ventricular diastolic parameters are indeterminate.  2. Right ventricular systolic function is moderately reduced. The right  ventricular size is mildly enlarged. Tricuspid regurgitation signal is inadequate for assessing PA pressure.  3. Left atrial size was severely dilated.  4. Right atrial size was severely dilated.  5. The mitral valve is abnormal. Moderate mitral valve regurgitation. No evidence of mitral stenosis.  6. The tricuspid valve is abnormal. Tricuspid valve regurgitation is moderate.  7. The aortic valve is tricuspid. Aortic valve regurgitation is moderate. No aortic stenosis is present.  8. Aortic dilatation noted. There is mild dilatation of the ascending aorta, measuring 41 mm.  9. The inferior vena cava is dilated in size with >50% respiratory variability, suggesting right atrial pressure of 8 mmHg. 10. Agitated saline contrast bubble study was negative, with no evidence of any interatrial shunt. FINDINGS  Left Ventricle: Left ventricular ejection fraction, by estimation, is 15-20%. The left ventricle has severely decreased function. The left ventricle demonstrates global hypokinesis. Definity contrast agent was given IV to delineate the left ventricular endocardial borders. The left ventricular internal cavity size was moderately dilated. There is mild left ventricular hypertrophy. Left ventricular diastolic parameters are indeterminate. Right Ventricle: The right ventricular size is mildly enlarged. Right vetricular wall thickness was not well visualized. Right ventricular systolic function is moderately reduced. Tricuspid regurgitation signal is inadequate for assessing PA pressure. Left Atrium: Left atrial size was severely dilated. Right Atrium: Right atrial size was severely dilated. Pericardium: There is no evidence of pericardial effusion. Mitral Valve: The mitral valve is abnormal. There is mild thickening of the mitral valve leaflet(s). There is mild calcification of the mitral valve leaflet(s). Mild mitral annular calcification. Moderate mitral valve regurgitation. No evidence of mitral  valve stenosis.  Tricuspid Valve: The tricuspid valve is abnormal. Tricuspid valve regurgitation is moderate . No evidence of tricuspid stenosis. Aortic Valve: The aortic valve is tricuspid. Aortic valve regurgitation is moderate. No aortic stenosis is present. Aortic valve mean gradient measures 5.0 mmHg. Aortic valve peak gradient measures 8.1 mmHg. Aortic valve area, by VTI measures 1.34 cm. Pulmonic Valve: The pulmonic valve was not well visualized. Pulmonic valve regurgitation  is mild. No evidence of pulmonic stenosis. Aorta: The aortic root is normal in size and structure and aortic dilatation noted. There is mild dilatation of the ascending aorta, measuring 41 mm. Venous: The inferior vena cava is dilated in size with greater than 50% respiratory variability, suggesting right atrial pressure of 8 mmHg. IAS/Shunts: No atrial level shunt detected by color flow Doppler. Agitated saline contrast was given intravenously to evaluate for intracardiac shunting. Agitated saline contrast bubble study was negative, with no evidence of any interatrial shunt.  LEFT VENTRICLE PLAX 2D LVIDd:         6.70 cm LVIDs:         6.20 cm LV PW:         1.20 cm LV IVS:        1.20 cm LVOT diam:     2.00 cm LV SV:         26 LV SV Index:   13 LVOT Area:     3.14 cm  RIGHT VENTRICLE             IVC RV Basal diam:  5.40 cm     IVC diam: 2.40 cm RV Mid diam:    4.00 cm RV S prime:     10.00 cm/s TAPSE (M-mode): 1.3 cm LEFT ATRIUM              Index        RIGHT ATRIUM           Index LA diam:        4.30 cm  2.19 cm/m   RA Area:     31.80 cm LA Vol (A2C):   192.0 ml 97.57 ml/m  RA Volume:   126.00 ml 64.03 ml/m LA Vol (A4C):   85.8 ml  43.60 ml/m LA Biplane Vol: 131.0 ml 66.57 ml/m  AORTIC VALVE AV Area (Vmax):    1.54 cm AV Area (Vmean):   1.29 cm AV Area (VTI):     1.34 cm AV Vmax:           142.00 cm/s AV Vmean:          107.000 cm/s AV VTI:            0.196 m AV Peak Grad:      8.1 mmHg AV Mean Grad:      5.0 mmHg LVOT Vmax:         69.50  cm/s LVOT Vmean:        44.100 cm/s LVOT VTI:          0.084 m LVOT/AV VTI ratio: 0.43  AORTA Ao Root diam: 3.60 cm Ao Asc diam:  4.10 cm MR Peak grad:    71.9 mmHg MR Mean grad:    49.0 mmHg    SHUNTS MR Vmax:         424.00 cm/s  Systemic VTI:  0.08 m MR Vmean:        326.0 cm/s   Systemic Diam: 2.00 cm MR PISA:         3.08 cm MR PISA Eff ROA: 22 mm MR PISA Radius:  0.70 cm Carlyle Dolly MD Electronically signed by Carlyle Dolly MD Signature Date/Time: 10/12/2021/1:21:38 PM    Final    VAS [place] LOWER EXTREMITY VENOUS (DVT)  Result Date: 10/12/2021  Lower Venous DVT Study Patient Name:  SHALAMAR SHIMODA  Date of Exam:   10/12/2021 Medical Rec #: [id number]    Accession #:    [id number]  Date of Birth: 1966/02/22   Patient Gender: M Patient Age:   42 years Exam Location:  [facility] Procedure:      VAS [place] LOWER EXTREMITY VENOUS (DVT) Referring Phys: Lajean Saver --------------------------------------------------------------------------------  Indications: Pulmonary embolism.  Risk Factors: None identified. Limitations: Poor ultrasound/tissue interface and patient positioning. Comparison Study: No prior studies. Performing Technologist: Oliver Hum RVT  Examination Guidelines: A complete evaluation includes B-mode imaging, spectral Doppler, color Doppler, and power Doppler as needed of all accessible portions of each vessel. Bilateral testing is considered an integral part of a complete examination. Limited examinations for reoccurring indications may be performed as noted. The reflux portion of the exam is performed with the patient in reverse Trendelenburg.  +---------+---------------+---------+-----------+----------+--------------+  RIGHT     Compressibility Phasicity Spontaneity Properties Thrombus Aging  +---------+---------------+---------+-----------+----------+--------------+  CFV       Full            Yes       Yes                                     +---------+---------------+---------+-----------+----------+--------------+  SFJ       Full                                                             +---------+---------------+---------+-----------+----------+--------------+  FV Prox   Full                                                             +---------+---------------+---------+-----------+----------+--------------+  FV Mid    Full                                                             +---------+---------------+---------+-----------+----------+--------------+  FV Distal Full                                                             +---------+---------------+---------+-----------+----------+--------------+  PFV       Full                                                             +---------+---------------+---------+-----------+----------+--------------+  POP       Full            Yes       Yes                                    +---------+---------------+---------+-----------+----------+--------------+  PTV       Full                                                             +---------+---------------+---------+-----------+----------+--------------+  PERO      Full                                                             +---------+---------------+---------+-----------+----------+--------------+   +---------+---------------+---------+-----------+----------+--------------+  LEFT      Compressibility Phasicity Spontaneity Properties Thrombus Aging  +---------+---------------+---------+-----------+----------+--------------+  CFV       Full            Yes       Yes                                    +---------+---------------+---------+-----------+----------+--------------+  SFJ       Full                                                             +---------+---------------+---------+-----------+----------+--------------+  FV Prox   Full                                                              +---------+---------------+---------+-----------+----------+--------------+  FV Mid    Full                                                             +---------+---------------+---------+-----------+----------+--------------+  FV Distal Full                                                             +---------+---------------+---------+-----------+----------+--------------+  PFV       Full                                                             +---------+---------------+---------+-----------+----------+--------------+  POP       Full            Yes       Yes                                    +---------+---------------+---------+-----------+----------+--------------+  PTV       Full                                                             +---------+---------------+---------+-----------+----------+--------------+  PERO      Full                                                             +---------+---------------+---------+-----------+----------+--------------+    Summary: RIGHT: - There is no evidence of deep vein thrombosis in the lower extremity.  - No cystic structure found in the popliteal fossa.  LEFT: - There is no evidence of deep vein thrombosis in the lower extremity.  - No cystic structure found in the popliteal fossa.  *See table(s) above for measurements and observations.    Preliminary     Assessment/Plan:   Principal Problem:   Stroke [facility]) Active Problems:   Hypertension, uncontrolled   Non-ischemic cardiomyopathy ([place])   Stage 3 chronic kidney disease ([place])   Acute pulmonary embolism ([place])   Patient Summary: Yojan Cooksley is a 56 y.o. male with a pertinent PMH of nonischemic cardiomyopathy, HFrEF (EF 25-30% 02/2020), HLD, HTN, and CKD3b admitted for acute embolic infarctions in the setting of mild nonocclusive pulmonary emboli.  Multifocal ischemic infarcts  Suspecting embolic stroke Deficits include right upper extremity weakness, right facial droop, and word finding  difficulty.  Neuro exam unchanged from yesterday.  With negative echo and DVT ultrasound, will proceed with TEE next week.  No evidence of A. fib on telemetry overnight.  Patient will need a cardiac monitor after discharge. High risk for hemorrhagic conversion - Neurology consulted, follow up on recommendations  - Allow for permissive hypertension  - Heparin infusion - TEE next week - Cardiac monitoring at discharge - LDL of 114, continue Crestor 20 mg - Telemetry - Frequent neuro checks  - PT/OT/SLP.  Watch for sign of aspiration.    Mild nonocclusive pulmonary emboli  Unclear source of emboli.  No known risk factor.  TTE showed EF of 15-20% with severely decreased left ventricle function, bubble study negative. LE dopplers negative for DVT. He will need TEE - Continue on heparin infusion.  Will discuss with neurology regarding the timing of transition to [place]. - If no clear etiology, will proceed with hypercoagulable study  AoC HFrEF exacerbation Hypertension Patient has nonischemic cardiomyopathy with severely reduced EF, thought was due to hypertension.  Autoimmune and amyloid test was negative.  Last EF 25-30% in 2021, repeat echo with EF 15-20%.   Patient put out 1300 cc of urine output last night.  Will continue the same dose of Lasix today given his worsening AKI. - Continue Lasix 80 mg twice daily - With his severe reduced EF, will watch closely for signs of cardiogenic shock - Daily weights and I/O's - Cardiac monitoring  - Holding BP medications to allow for permissive hypertension. Losartan restarted per neuro.    AKI on CKD3b Baseline of 1.6-1.8.  Creatinine trending up to 2.08 today.  Could be cardiorenal in combination with prerenal.  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  - Annual imaging follow-up recommended by CTA or MRA    Best Practice: Diet: Dysphagia 3  IVF:  None,None VTE: Heparin Code: Full PT/OT: pending heparin lv to be therapeutic  Christiana Fuchs, DO 10/13/2021, 6:41 AM Pager: [phone]  Please contact the on call pager after 5 pm and on weekends at [phone].

## 2021-10-13 NOTE — TOC Initial Note (Signed)
Transition of Care [facility]) - Initial/Assessment Note    Patient Details  Name: Oscar Rosales MRN: [id number] Date of Birth: 02-05-1966  Transition of Care [facility]) CM/SW Contact:    Carles Collet, RN Phone Number: 10/13/2021, 8:04 AM  Clinical Narrative:              Patient from home w spouse admitted with syncope and multiple acute/subacute infarcts on his MRI. Hx EF 25-30%  and noncompliance with medications for the past 3 months.  PT OT ordered and pending. TOC will continue to follow   Expected Discharge Plan: [place] Barriers to Discharge: Continued Medical Work up   Patient Goals and CMS Choice        Expected Discharge Plan and Services Expected Discharge Plan: [place]   Discharge Planning Services: CM Consult   Living arrangements for the past 2 months: Single Family Home                                      Prior Living Arrangements/Services Living arrangements for the past 2 months: Single Family Home Lives with:: Spouse                   Activities of Daily Living      Permission Sought/Granted                  Emotional Assessment              Admission diagnosis:  Delirium [R41.0] Trauma [T14.90XA] Stroke [facility]) [I63.9] NSTEMI (non-ST elevated myocardial infarction) ([place]) [I21.4] Cerebellar infarct ([place]) [I63.9] Uncontrolled hypertension [I10] Multiple cerebral infarctions ([place]) [I63.9] Fall, initial encounter [W19.XXXA] Other acute pulmonary embolism without acute cor pulmonale (HCC) [I26.99] Acute on chronic congestive heart failure, unspecified heart failure type [facility]) [I50.9] Patient Active Problem List   Diagnosis Date Noted   Acute pulmonary embolism ([place]) 10/12/2021   Stroke ([place]) 10/11/2021   Noncompliance with medication regimen 02/15/2021   Hx of iron deficiency 03/26/2020   Dyslipidemia 04/18/2019   Non-ischemic cardiomyopathy ([place]) 06/13/2017   Stage 3 chronic kidney  disease ([place]) 04/28/2017   Coronary artery disease involving native coronary artery of native heart without angina pectoris 02/16/2015   Hypertension, uncontrolled 02/16/2015   PCP:  Curt Jews, PA-C Pharmacy:   [id number] - [place], [place] - [address] AT [address] [place] [address] Phone: [phone] Fax: [phone]     Social Determinants of Health (SDOH) Interventions    Readmission Risk Interventions No flowsheet data found.

## 2021-10-13 NOTE — Evaluation (Signed)
Physical Therapy Evaluation Patient Details Name: Oscar Rosales MRN: [id number] DOB: [birth year] Today's Date: 10/13/2021  History of Present Illness  Pt is a 56 y/o male admitted secondary to fatigue and syncopal episode at work. 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 Pt also found to have mild nonocclusive pulmonary emboli and started on Heparin drip. PMH including but not limited to HFrEF (EF 25-30% 02/2020), HLD, HTN, CAD, and CKD3b.   Clinical Impression  Pt presented supine in bed with HOB elevated, awake and willing to participate in therapy session. His RN and NT were present in room for pericare and clean up after pt having a large BM in bed. PT assisted with bed mobility and positioning during pericare. Pt required max A x2 to roll bilaterally multiple times. After bed mobility, pt very fatigued and unable to participate in any further mobility at this time. Pt agreeable to short-term rehab at a SNF prior to returning home. Pt would continue to benefit from skilled physical therapy services at this time while admitted and after d/c to address the below listed limitations in order to improve overall safety and independence with functional mobility.        Recommendations for follow up therapy are one component of a multi-disciplinary discharge planning process, led by the attending physician.  Recommendations may be updated based on patient status, additional functional criteria and insurance authorization.  Follow Up Recommendations Skilled nursing-short term rehab (<3 hours/day)    Assistance Recommended at Discharge Frequent or constant Supervision/Assistance  Patient can return home with the following  Two people to help with walking and/or transfers;Two people  to help with bathing/dressing/bathroom    Equipment Recommendations Other (comment) (TBD, defer to next venue of care)  Recommendations for Other Services       Functional Status Assessment Patient has had a recent decline in their functional status and demonstrates the ability to make significant improvements in function in a reasonable and predictable amount of time.     Precautions / Restrictions Precautions Precautions: Fall Restrictions Weight Bearing Restrictions: No      Mobility  Bed Mobility Overal bed mobility: Needs Assistance Bed Mobility: Rolling Rolling: Max assist, +2 for physical assistance         General bed mobility comments: pt in the process of being cleaned up by RN and NT after having a BM in bed; PT assisted with bed mobility for pericare; pt required max A x2 to roll bilaterally multiple times for cleaning purposes. He was very fatigued afterwards and unable to further participate in any mobility    Transfers                   General transfer comment: deferred as pt very fatigued after bed mobility and unable to participate in any further mobility at this time    Ambulation/Gait                  Stairs            Wheelchair Mobility    Modified Rankin (Stroke Patients Only) Modified Rankin (Stroke Patients Only) Pre-Morbid Rankin Score: No symptoms Modified Rankin: Moderately severe disability     Balance  Pertinent Vitals/Pain Pain Assessment Pain Assessment: No/denies pain    Home Living Family/patient expects to be discharged to:: Private residence Living Arrangements: Other relatives Available Help at Discharge: Family   Home Access: Stairs to enter   Technical brewer of Steps: "just a few"   Home Layout: One level Home Equipment: None      Prior Function Prior Level of Function : Independent/Modified Independent                      Hand Dominance        Extremity/Trunk Assessment   Upper Extremity Assessment Upper Extremity Assessment: Defer to OT evaluation    Lower Extremity Assessment Lower Extremity Assessment: Difficult to assess due to impaired cognition       Communication   Communication: No difficulties  Cognition Arousal/Alertness: Lethargic Behavior During Therapy: Flat affect Overall Cognitive Status: Impaired/Different from baseline Area of Impairment: Attention, Memory, Following commands, Safety/judgement, Awareness, Problem solving                   Current Attention Level: Selective Memory: Decreased short-term memory Following Commands: Follows one step commands with increased time, Follows multi-step commands inconsistently, Follows multi-step commands with increased time Safety/Judgement: Decreased awareness of deficits Awareness: Intellectual Problem Solving: Slow processing, Decreased initiation, Difficulty sequencing, Requires verbal cues, Requires tactile cues          General Comments      Exercises     Assessment/Plan    PT Assessment Patient needs continued PT services  PT Problem List Decreased range of motion;Decreased strength;Decreased activity tolerance;Decreased mobility;Decreased balance;Decreased coordination;Decreased safety awareness;Decreased knowledge of use of DME;Decreased cognition;Decreased knowledge of precautions       PT Treatment Interventions DME instruction;Gait training;Stair training;Functional mobility training;Therapeutic activities;Therapeutic exercise;Balance training;Neuromuscular re-education;Cognitive remediation;Patient/family education    PT Goals (Current goals can be found in the Care Plan section)  Acute Rehab PT Goals Patient Stated Goal: to get stronger PT Goal Formulation: With patient Time For Goal Achievement: 10/27/21 Potential to Achieve Goals: Fair    Frequency Min 2X/week     Co-evaluation                AM-PAC PT "6 Clicks" Mobility  Outcome Measure Help needed turning from your back to your side while in a flat bed without using bedrails?: A Lot Help needed moving from lying on your back to sitting on the side of a flat bed without using bedrails?: A Lot Help needed moving to and from a bed to a chair (including a wheelchair)?: Total Help needed standing up from a chair using your arms (e.g., wheelchair or bedside chair)?: Total Help needed to walk in hospital room?: Total Help needed climbing 3-5 steps with a railing? : Total 6 Click Score: 8    End of Session Equipment Utilized During Treatment: Oxygen Activity Tolerance: Patient limited by fatigue Patient left: in bed;with call bell/phone within reach;with bed alarm set Nurse Communication: Mobility status PT Visit Diagnosis: Other abnormalities of gait and mobility (R26.89)    Time: [id number] PT Time Calculation (min) (ACUTE ONLY): 16 min   Charges:   PT Evaluation $PT Eval Moderate Complexity: 1 Mod          Eduard Clos, PT, DPT  Acute Rehabilitation Services Office [place] 10/13/2021, 12:55 PM

## 2021-10-13 NOTE — Progress Notes (Signed)
ANTICOAGULATION CONSULT NOTE   Pharmacy Consult for IV heparin Indication: pulmonary embolus  Allergies  Allergen Reactions   Lisinopril Cough    SWITCHED TO LOSARTAN    Patient Measurements: Height: 5\' 11"  (180.3 cm) Weight: 77.1 kg (170 lb) IBW/kg (Calculated) : 75.3 Heparin Dosing Weight: 77.1kg  Vital Signs: BP: 136/110 (01/28 2305) Pulse Rate: 98 (01/28 2305)  Labs: Recent Labs    10/11/21 1330 10/11/21 1338 10/11/21 1644 10/11/21 2256 10/12/21 0117 10/12/21 0758 10/12/21 1520 10/13/21 0055  HGB 14.1 14.6   15.0  --   --  15.0  --   --  14.8  HCT 45.3 43.0   44.0  --   --  46.2  --   --  43.8  PLT 165  --   --   --  197  --   --  166  LABPROT 15.7*  --   --   --  16.1*  --   --   --   INR 1.3*  --   --   --  1.3*  --   --   --   HEPARINUNFRC  --   --   --    < >  --  0.71* 0.51 0.39  CREATININE 1.88* 1.90*  --   --  1.82*  --   --  2.08*  CKTOTAL 290  --   --   --   --   --   --   --   TROPONINIHS 48*  --  50*  --   --   --   --   --    < > = values in this interval not displayed.     Estimated Creatinine Clearance: 42.2 mL/min (A) (by C-G formula based on SCr of 2.08 mg/dL (H)).   Medical History: Past Medical History:  Diagnosis Date   CHF (congestive heart failure) ([place])    Hypertension    Renal disorder    Assessment: 1 YOM  presenting to the ED after found unresponsive at work. No history of anticoagulation prior to admission. CT angio positive for mild nonocclusive PE in the right lobe. Pharmacy to dose IV heparin.   H/H is stable and within normal limits, platelets are within normal limits. Renal function is okay, though Scr increased 2.08 overnight.  Heparin level this AM 0.39 which is at goal. No signs of bleeding noted.   Goal of Therapy:  Heparin level 0.3-0.7 units/ml Monitor platelets by anticoagulation protocol: Yes   Plan:  Continue IV heparin at 1100 units/hr. Daily heparin level and CBC.  Cephus Slater, PharmD, MBA Pharmacy  Resident [phone] 10/13/2021 7:31 AM

## 2021-10-13 NOTE — Progress Notes (Signed)
CCMD reported 5 beat runs of VT.  Patient is resting, will continue to monitor

## 2021-10-13 NOTE — Progress Notes (Addendum)
STROKE TEAM PROGRESS NOTE   INTERVAL HISTORY Wife is at the bedside. Plan for TEE this week. He is drowsy, pupils unequal. Moves all extremities antigravity. Generalized weakness with strength exam. Became tachypnic on exam  Vitals:   10/12/21 1502 10/12/21 1905 10/12/21 2305 10/13/21 0759  BP: (!) 155/118 (!) 155/113 (!) 136/110 (!) 134/107  Pulse: 89  98 78  Resp: 16 20 17 14   Temp: 97.6 F (36.4 C)   97.6 F (36.4 C)  TempSrc: Oral   Oral  SpO2: 100% 100% 100% 100%  Weight:      Height:       CBC:  Recent Labs  Lab 10/12/21 0117 10/13/21 0055  WBC 8.2 11.7*  HGB 15.0 14.8  HCT 46.2 43.8  MCV 96.0 95.8  PLT 197 166    Basic Metabolic Panel:  Recent Labs  Lab 10/12/21 0117 10/13/21 0055  NA 140 140  K 3.2* 3.5  CL 104 103  CO2 27 24  GLUCOSE 123* 109*  BUN 24* 29*  CREATININE 1.82* 2.08*  CALCIUM 8.4* 8.4*    Lipid Panel:  Recent Labs  Lab 10/12/21 0117  CHOL 167  TRIG 40  HDL 45  CHOLHDL 3.7  VLDL 8  LDLCALC 114*    HgbA1c:  Recent Labs  Lab 10/11/21 2256  HGBA1C 5.2    Urine Drug Screen:  Recent Labs  Lab 10/11/21 1358  LABOPIA NONE DETECTED  COCAINSCRNUR NONE DETECTED  LABBENZ NONE DETECTED  AMPHETMU NONE DETECTED  THCU NONE DETECTED  LABBARB NONE DETECTED     Alcohol Level  Recent Labs  Lab 10/11/21 1330  ETH <10     IMAGING past 24 hours VAS 10/13/21 LOWER EXTREMITY VENOUS (DVT)  Result Date: 10/13/2021  Lower Venous DVT Study Patient Name:  Oscar Rosales  Date of Exam:   10/12/2021 Medical Rec #: 10/14/2021    Accession #:    [id number] Date of Birth: June 03, 1966   Patient Gender: M Patient Age:   30 years Exam Location:  [facility] Procedure:      VAS [facility] LOWER EXTREMITY VENOUS (DVT) Referring Phys: [place] --------------------------------------------------------------------------------  Indications: Pulmonary embolism.  Risk Factors: None identified. Limitations: Poor ultrasound/tissue interface and patient positioning.  Comparison Study: No prior studies. Performing Technologist: Cathren Laine RVT  Examination Guidelines: A complete evaluation includes B-mode imaging, spectral Doppler, color Doppler, and power Doppler as needed of all accessible portions of each vessel. Bilateral testing is considered an integral part of a complete examination. Limited examinations for reoccurring indications may be performed as noted. The reflux portion of the exam is performed with the patient in reverse Trendelenburg.  +---------+---------------+---------+-----------+----------+--------------+  RIGHT     Compressibility Phasicity Spontaneity Properties Thrombus Aging  +---------+---------------+---------+-----------+----------+--------------+  CFV       Full            Yes       Yes                                    +---------+---------------+---------+-----------+----------+--------------+  SFJ       Full                                                             +---------+---------------+---------+-----------+----------+--------------+  FV Prox   Full                                                             +---------+---------------+---------+-----------+----------+--------------+  FV Mid    Full                                                             +---------+---------------+---------+-----------+----------+--------------+  FV Distal Full                                                             +---------+---------------+---------+-----------+----------+--------------+  PFV       Full                                                             +---------+---------------+---------+-----------+----------+--------------+  POP       Full            Yes       Yes                                    +---------+---------------+---------+-----------+----------+--------------+  PTV       Full                                                             +---------+---------------+---------+-----------+----------+--------------+  PERO       Full                                                             +---------+---------------+---------+-----------+----------+--------------+   +---------+---------------+---------+-----------+----------+--------------+  LEFT      Compressibility Phasicity Spontaneity Properties Thrombus Aging  +---------+---------------+---------+-----------+----------+--------------+  CFV       Full            Yes       Yes                                    +---------+---------------+---------+-----------+----------+--------------+  SFJ       Full                                                             +---------+---------------+---------+-----------+----------+--------------+  FV Prox   Full                                                             +---------+---------------+---------+-----------+----------+--------------+  FV Mid    Full                                                             +---------+---------------+---------+-----------+----------+--------------+  FV Distal Full                                                             +---------+---------------+---------+-----------+----------+--------------+  PFV       Full                                                             +---------+---------------+---------+-----------+----------+--------------+  POP       Full            Yes       Yes                                    +---------+---------------+---------+-----------+----------+--------------+  PTV       Full                                                             +---------+---------------+---------+-----------+----------+--------------+  PERO      Full                                                             +---------+---------------+---------+-----------+----------+--------------+     Summary: RIGHT: - There is no evidence of deep vein thrombosis in the lower extremity.  - No cystic structure found in the popliteal fossa.  LEFT: - There is no evidence of deep vein thrombosis in the  lower extremity.  - No cystic structure found in the popliteal fossa.  *See table(s) above for measurements and observations. Electronically signed by Lemar Livings MD on 10/13/2021 at 9:29:52 AM.    Final     PHYSICAL EXAM  Physical Exam  Constitutional: Ill appearing, frail African American male.  Cardiovascular: Tachycardic and regular rhythm.  Respiratory: Labored, tachpnic and short of breath GI: Soft.  No distension. There is no tenderness.  Msk:   Neuro: Mental Status: Patient is awake,  alert, to self, person, and place. Some confusion on the date, states it is 2003.  Speech is slightly dysarthric. No signs of aphasia or neglect Cranial Nerves: II: Visual Fields are full. Left anisocoria   but equally reactive. III,IV, VI: He has bilateral internuclear ophthalmoplegia VII: Facial movement is symmetric.  VIII: hearing is intact to voice X: Uvula elevates symmetrically XI: Shoulder shrug is symmetric. XII: tongue is midline without atrophy or fasciculations.  Motor: Tone is normal. Bulk is normal.  RUE 4/5   LUE 5/5 RLE 4/5 with drift LLE 5/5 Sensory: Sensation is symmetric to light touch and temperature in the arms and legs. No extinction to DSS present.  Cerebellar: Discoordination of RUE   ASSESSMENT/PLAN Mr. Oscar Rosales is a 56 y.o. male with history of CHF, ascending thoracic aortic dilation, HTN, noncompliance with medications, CKD III presenting with AMS. He states that he woke up feeling tired. Went to work, and shortly after abruptly started to feel lightheaded and before he knew it, he passed out. Does not remember falling down, but woke up on ground. Unsure how long he was down, possibly 10-20 minutes. No witnesses of the the event. Someone found him down on the bathroom and called EMS. 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 He is currently on a heparin drip. MRI head shows scattered infarcts involving  the right cerebellar hemisphere, left cerebral peduncle, left hippocampus, left thalamus, right occipital lobe, and left pre and postcentral gyri, some of which appear acute while others appear more subacute in chronicity. He will need a TEE and cardiac monitor (30 day or loop), and possible hypercoag panel and cardiology consult.   Stroke:  Scattered infarcts involving multiple territories likely secondary embolic source Code Stroke- No acute abnormality. Small vessel disease.  CTA head & neck No large vessel occlusion, hemodynamically significant stenosis, or evidence of dissection. 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  CXR- Cardiomegaly. There is pulmonary vascular congestion and pulmonary edema in both lungs. Possibility of underlying pneumonia is not excluded MRI- Scattered infarcts involving the right cerebellar hemisphere, left cerebral peduncle, left hippocampus, left thalamus, right occipital lobe, and left pre and postcentral gyri, some of which appear acute while others appear more subacute in chronicity. CTA Chest- Mild nonocclusive pulmonary emboli within the right middle lobe pulmonary artery and right lower lobe medial segment pulmonary artery. Marked cardiomegaly, possibly mildly worsened from 08/26/2019. Small right and trace left pleural effusions. Scattered ground-glass opacities compatible with pulmonary edema, subsegmental atelectasis, and/or a mild infectious/inflammatory process 2D Echo - EF 15-20%, severely decrease LV function BLE Venous duplex- No evidence of DVT in lower extremities LDL 114 HgbA1c 5.2 VTE prophylaxis - Heparin IV    Diet   DIET DYS 3 Room service appropriate? Yes; Fluid consistency: Thin   No antithrombotic prior to admission, now on heparin IV.  Therapy recommendations:  CIR Disposition:  Pending  Pulmonary Embolism Heparin IV protocol Management by primary team  AoC HFrEF exacerbation Home meds: Imdur,  digoxin, torsemide Recent titration of torsemide r/t increasing BNP and sodium intake Echo pending  Hypertension Home meds:  hydralazine 100mg , carvedilol, clonidine 0.3 patch, cozaar Unstable- non compliant with home medication for approximately 3 months Permissive hypertension (OK if < 220/120) but gradually normalize in 5-7 days Long-term BP goal normotensive  Hyperlipidemia Home meds:  None LDL 114, goal < 70 Add Crestor  High intensity statin not indicated  Continue statin at discharge  Other Stroke Risk Factors Coronary artery disease Congestive heart failure Se[facility] Consider cardiology consult   Chronic Kidney Disease- follows with [facility] nephrology Dr. Lequita Halt Monitor I&O Creatinine baseline 1.7-2.0 Cr elevated 1.8-> 2.08  Variably positive ANA  Antibody testing negative. His initial ANA was negative, retest positive, antibody screening reassuring Done outpatient at [facility] day # 1  Patient seen and examined by NP/APP with MD. MD to update note as needed.   Elmer Picker, DNP, FNP-BC [facility] Pager: [phone]  ATTENDING ATTESTATION:  56 year old male with history of CHF hypertension noncompliant to medication chronic kidney disease 3 and ascending thoracic aortic dilatation and multifocal pulmonary emboli on CTA chest.  He was found down in the bathroom.  Brain MRI shows scattered embolic infarcts.  He is on heparin drip.  Young stroke work-up ordered.  Lower extremity Dopplers negative.  PT recommends skilled nursing rehab.  He will need TEE and loop monitor.  Dr. Viviann Spare evaluated pt independently, reviewed imaging, chart, labs. Discussed and formulated plan with the APP. Please see APP note above for details.   Total 36 minutes spent on counseling patient and coordinating care, writing notes and reviewing chart.   Gershom Brobeck,MD    To contact Stroke Continuity provider,  please refer to [email or web address]. After hours, contact General Neurology

## 2021-10-14 ENCOUNTER — Other Ambulatory Visit (HOSPITAL_COMMUNITY): Payer: BC Managed Care – PPO

## 2021-10-14 ENCOUNTER — Inpatient Hospital Stay: Payer: Self-pay

## 2021-10-14 ENCOUNTER — Inpatient Hospital Stay (HOSPITAL_COMMUNITY): Payer: BC Managed Care – PPO

## 2021-10-14 ENCOUNTER — Other Ambulatory Visit (HOSPITAL_COMMUNITY): Payer: Self-pay

## 2021-10-14 DIAGNOSIS — I2699 Other pulmonary embolism without acute cor pulmonale: Secondary | ICD-10-CM | POA: Diagnosis not present

## 2021-10-14 DIAGNOSIS — N179 Acute kidney failure, unspecified: Secondary | ICD-10-CM | POA: Diagnosis not present

## 2021-10-14 DIAGNOSIS — I5023 Acute on chronic systolic (congestive) heart failure: Secondary | ICD-10-CM

## 2021-10-14 DIAGNOSIS — N1832 Chronic kidney disease, stage 3b: Secondary | ICD-10-CM

## 2021-10-14 DIAGNOSIS — I3139 Other pericardial effusion (noninflammatory): Secondary | ICD-10-CM

## 2021-10-14 DIAGNOSIS — I639 Cerebral infarction, unspecified: Secondary | ICD-10-CM

## 2021-10-14 LAB — COOXEMETRY PANEL
Carboxyhemoglobin: 0.8 % (ref 0.5–1.5)
Methemoglobin: 0.7 % (ref 0.0–1.5)
O2 Saturation: 44 %
Total hemoglobin: 12.9 g/dL (ref 12.0–16.0)

## 2021-10-14 LAB — BASIC METABOLIC PANEL
Anion gap: 11 (ref 5–15)
BUN: 41 mg/dL — ABNORMAL HIGH (ref 6–20)
CO2: 21 mmol/L — ABNORMAL LOW (ref 22–32)
Calcium: 8.2 mg/dL — ABNORMAL LOW (ref 8.9–10.3)
Chloride: 105 mmol/L (ref 98–111)
Creatinine, Ser: 2.22 mg/dL — ABNORMAL HIGH (ref 0.61–1.24)
GFR, Estimated: 34 mL/min — ABNORMAL LOW (ref >60)
Glucose, Bld: 101 mg/dL — ABNORMAL HIGH (ref 70–99)
Potassium: 4.4 mmol/L (ref 3.5–5.1)
Sodium: 137 mmol/L (ref 135–145)

## 2021-10-14 LAB — PROTEIN C, TOTAL: Protein C, Total: 79 % (ref 60–150)

## 2021-10-14 LAB — CBC
HCT: 43.4 % (ref 39.0–52.0)
Hemoglobin: 14.3 g/dL (ref 13.0–17.0)
MCH: 32.1 pg (ref 26.0–34.0)
MCHC: 32.9 g/dL (ref 30.0–36.0)
MCV: 97.3 fL (ref 80.0–100.0)
Platelets: 127 10*3/uL — ABNORMAL LOW (ref 150–400)
RBC: 4.46 MIL/uL (ref 4.22–5.81)
RDW: 15.4 % (ref 11.5–15.5)
WBC: 8.9 10*3/uL (ref 4.0–10.5)
nRBC: 0 % (ref 0.0–0.2)

## 2021-10-14 LAB — CARDIOLIPIN ANTIBODIES, IGG, IGM, IGA
Anticardiolipin IgA: <9 APL U/mL (ref 0–11)
Anticardiolipin IgG: <9 GPL U/mL (ref 0–14)
Anticardiolipin IgM: 17 MPL U/mL — ABNORMAL HIGH (ref 0–12)

## 2021-10-14 LAB — ANA W/REFLEX IF POSITIVE: Anti Nuclear Antibody (ANA): POSITIVE — AB

## 2021-10-14 LAB — ENA+DNA/DS+ANTICH+CENTRO+JO...
Anti JO-1: <0.2 AI (ref 0.0–0.9)
Centromere Ab Screen: <0.2 AI (ref 0.0–0.9)
Chromatin Ab SerPl-aCnc: <0.2 AI (ref 0.0–0.9)
ENA SM Ab Ser-aCnc: <0.2 AI (ref 0.0–0.9)
Ribonucleic Protein: <0.2 AI (ref 0.0–0.9)
SSA (Ro) (ENA) Antibody, IgG: <0.2 AI (ref 0.0–0.9)
SSB (La) (ENA) Antibody, IgG: <0.2 AI (ref 0.0–0.9)
Scleroderma (Scl-70) (ENA) Antibody, IgG: 1.3 AI — ABNORMAL HIGH (ref 0.0–0.9)
ds DNA Ab: 10 IU/mL — ABNORMAL HIGH (ref 0–9)

## 2021-10-14 LAB — HEPARIN LEVEL (UNFRACTIONATED): Heparin Unfractionated: 0.29 IU/mL — ABNORMAL LOW (ref 0.30–0.70)

## 2021-10-14 IMAGING — US US RENAL
1 series · 14 of 25 positions shown · non-contrast
Comparison: None.

CLINICAL DATA: Acute renal injury

EXAM:
RENAL / URINARY TRACT ULTRASOUND COMPLETE

[Series 1: us renal · 34 acquisitions, 14 frames shown]
[im 1/34]
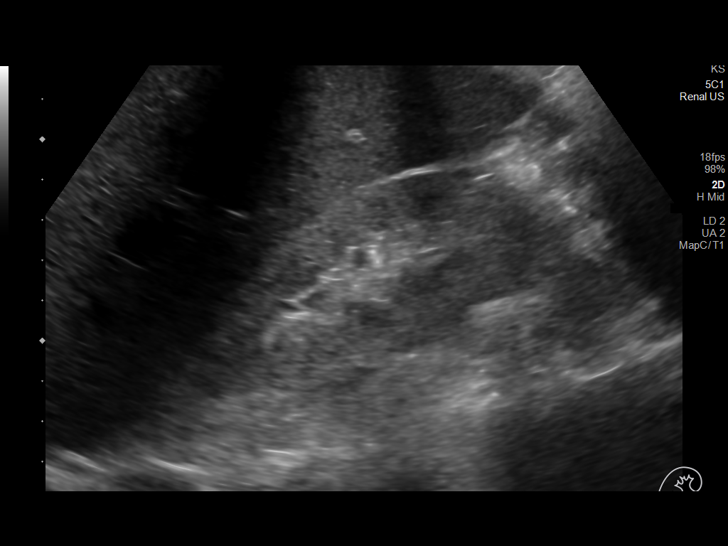
[im 3/34]
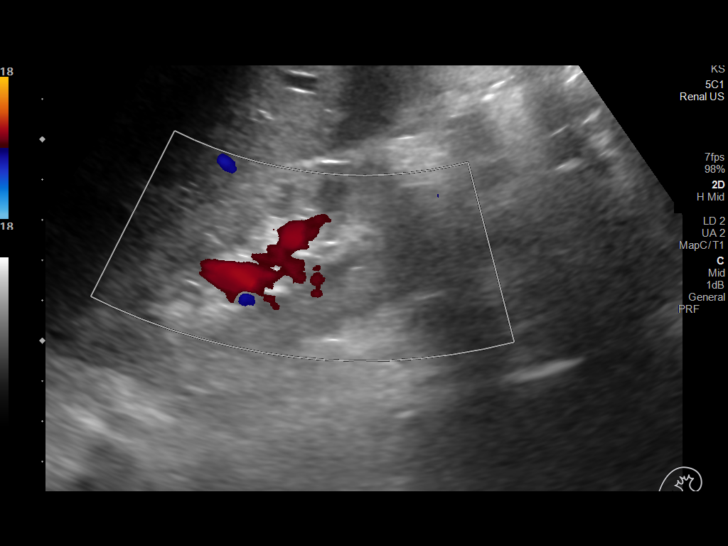
[im 6/34]
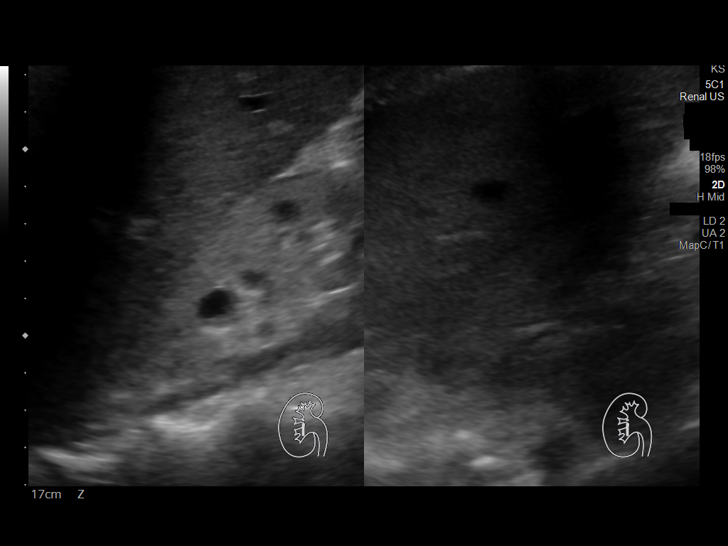
[im 9/34]
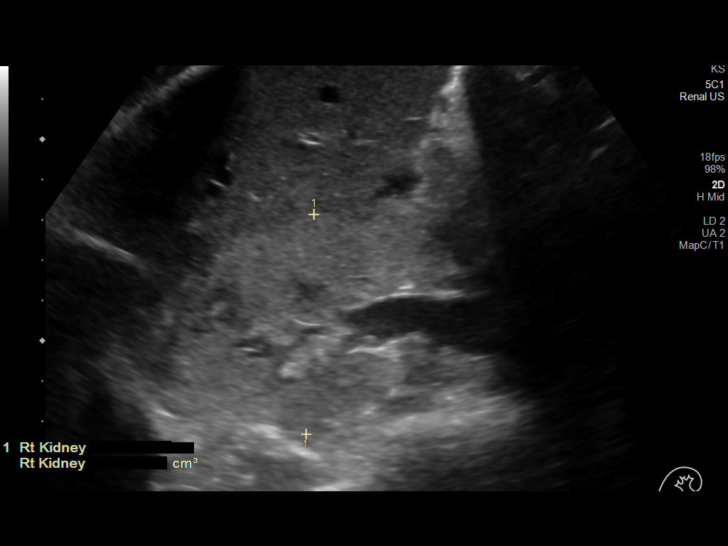
[im 12/34]
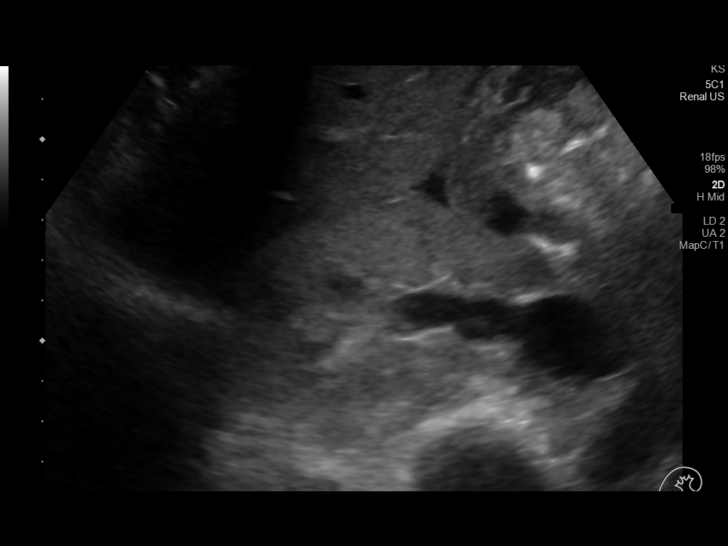
[im 13/34]
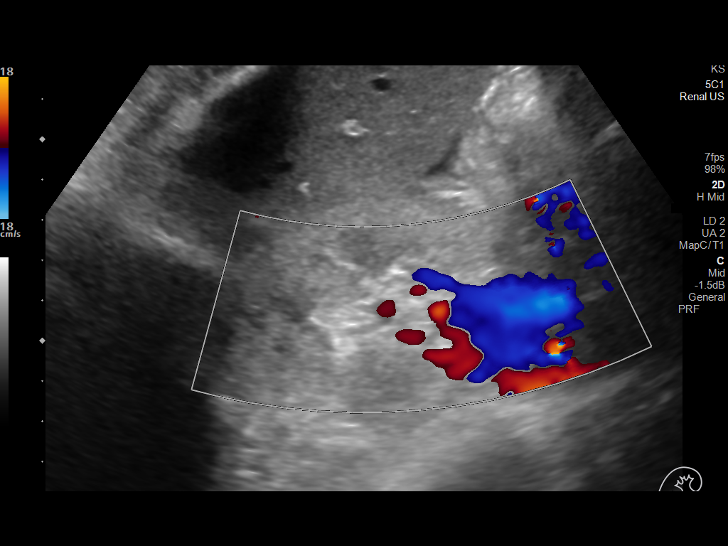
[im 16/34]
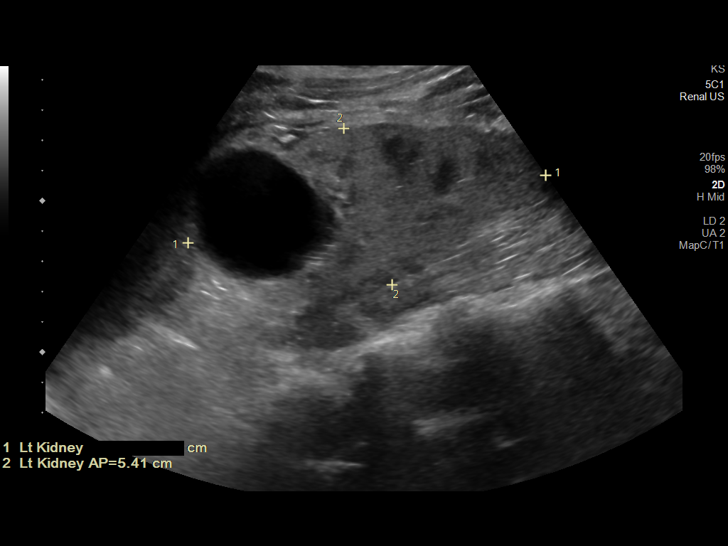
[im 18/34]
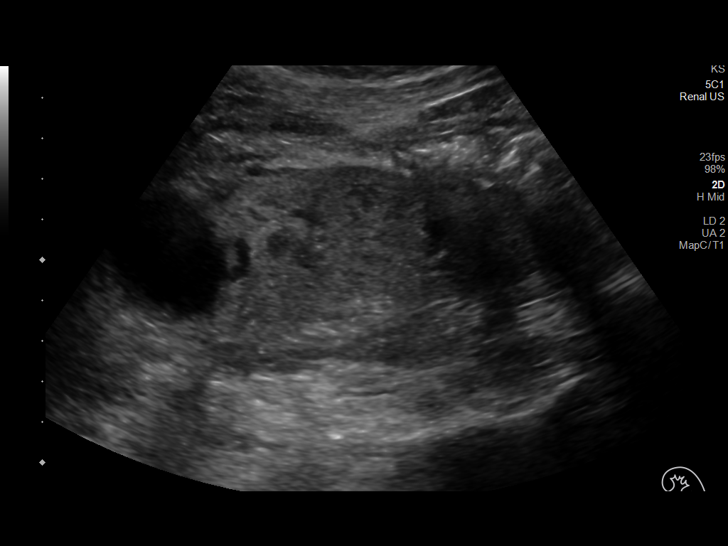
[im 21/34]
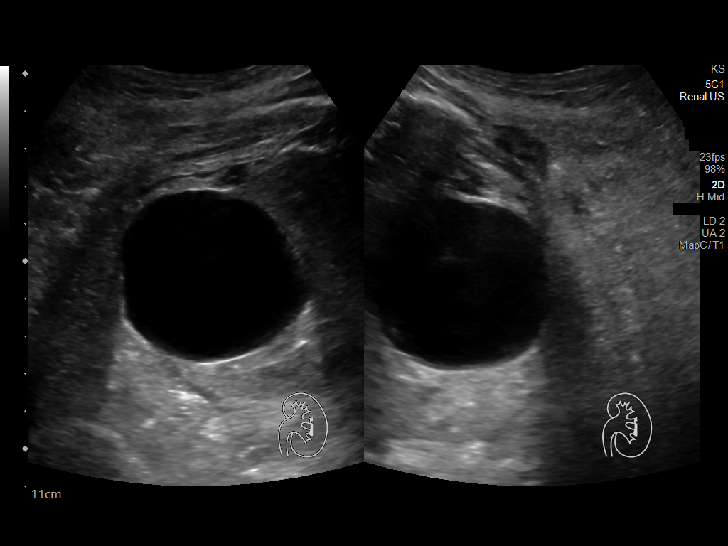
[im 23/34]
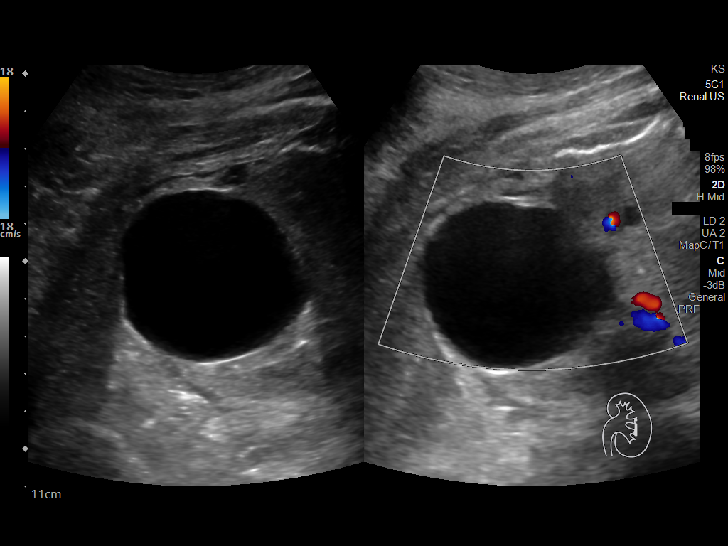
[im 25/34]
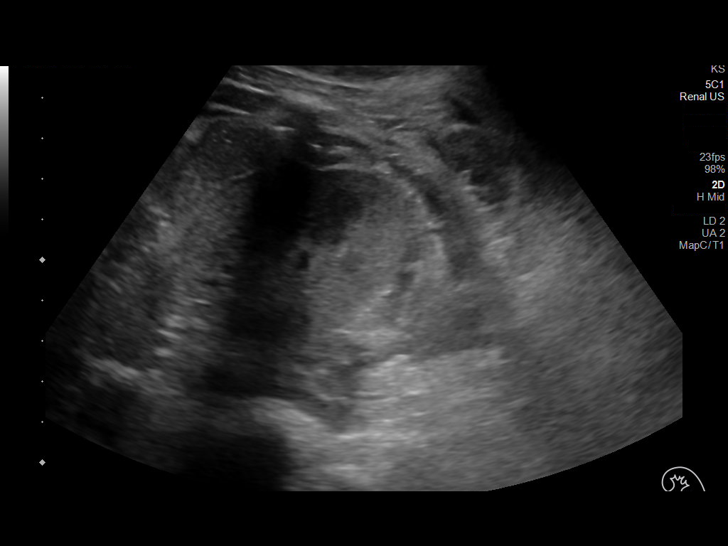
[im 28/34]
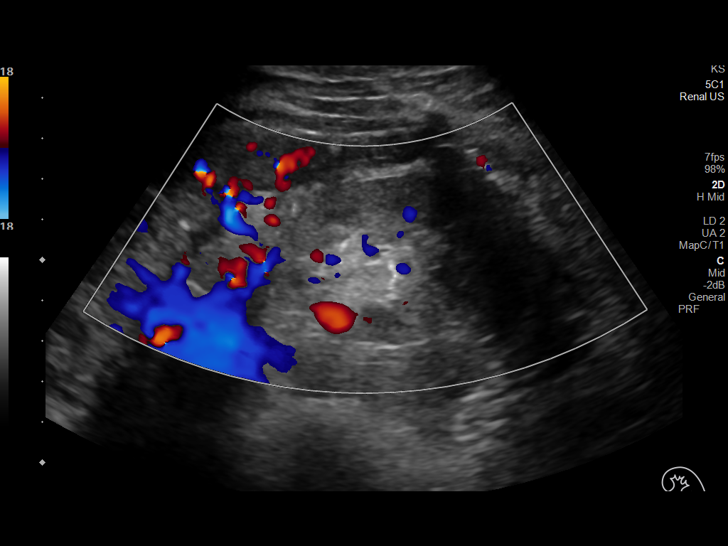
[im 31/34]
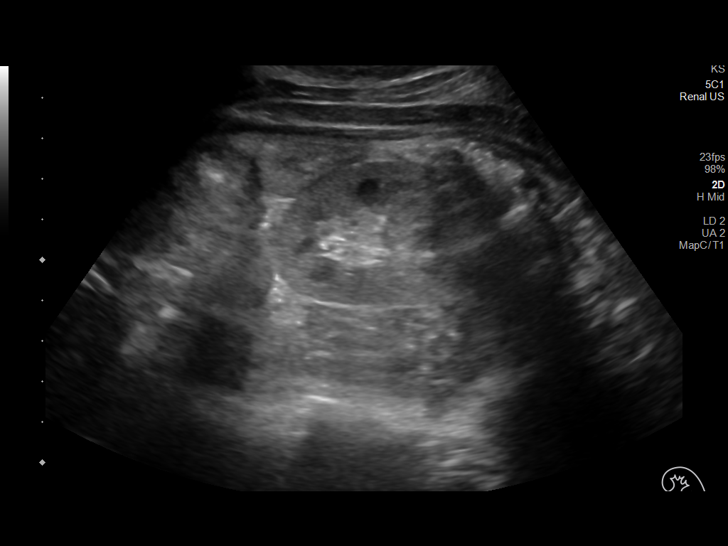
[im 34/34]
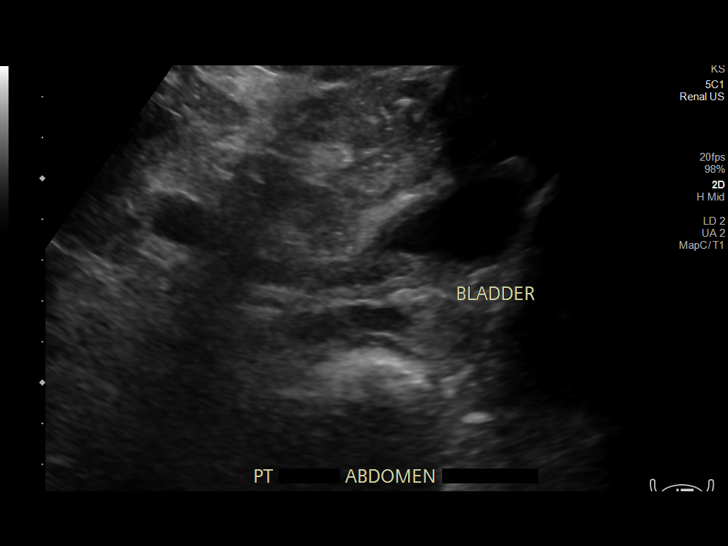

[14 of 25 positions shown; findings below may reference images not displayed]

FINDINGS: Right Kidney:

Renal measurements: 9.3 x 4.0 x 5.5 cm. = volume: 105 mL. Diffuse
increased echogenicity is noted. No mass lesion or hydronephrosis is
noted.

Left Kidney:

Renal measurements: 12.0 x 5.4 x 4.3 cm. = volume: 146 mL. 5 cm
upper pole simple cyst is noted. No mass lesion or hydronephrosis is
noted. Diffuse increased echogenicity is seen.

Bladder:

Decompressed

Other:

None.
IMPRESSION: Large left renal cyst.

Increased echogenicity bilaterally consistent with medical renal
disease.

## 2021-10-14 MED ORDER — SODIUM CHLORIDE 0.9% FLUSH: 10.0000 mL | INTRAVENOUS | Status: DC | PRN

## 2021-10-14 MED ORDER — SODIUM CHLORIDE 0.9% FLUSH
10.0000 mL | Freq: Two times a day (BID) | INTRAVENOUS | Status: DC
  Administered 2021-10-14: 10 mL
  Administered 2021-10-15 – 2021-10-16 (×2): 20 mL
  Administered 2021-10-16 – 2021-10-23 (×11): 10 mL
  Administered 2021-10-24: 40 mL
  Administered 2021-10-24 – 2021-10-25 (×2): 10 mL

## 2021-10-14 MED ORDER — CHLORHEXIDINE GLUCONATE CLOTH 2 % EX PADS
6.0000 | MEDICATED_PAD | Freq: Every day | CUTANEOUS | Status: DC
  Administered 2021-10-14 – 2021-10-25 (×11): 6 via TOPICAL

## 2021-10-14 MED ORDER — DOBUTAMINE IN D5W 4-5 MG/ML-% IV SOLN
2.5000 ug/kg/min | INTRAVENOUS | Status: DC
  Administered 2021-10-14: 2.5 ug/kg/min via INTRAVENOUS
  Filled 2021-10-14: qty 250

## 2021-10-14 NOTE — Evaluation (Signed)
Occupational Therapy Evaluation Patient Details Name: Oscar Rosales MRN: [id number] DOB: 01/19/1966 Today's Date: 10/14/2021   History of Present Illness Pt is a 56 y/o male admitted secondary to fatigue and syncopal episode at work. 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 Pt also found to have mild nonocclusive pulmonary emboli and started on Heparin drip. PMH including but not limited to HFrEF (EF 25-30% 02/2020), HLD, HTN, CAD, and CKD3b.   Clinical Impression   Oscar Rosales reports indep baseline PTA including working and driving, he lives with his wife and 2 teenaged boys in a 1 level home, 2-3 STE. Upon evaluation pt followed most one step commands well with intermittent confusion noted. Overall he required min A for bed mobility, simple transfers and side stepping at the bedside. He is limited by impaired cognition, coordination, balance, weakness and poor activity tolerance. Due to new limitations, he now requires up to mod A for ADLs. Pt will benefit from continued OT acutely. Due to pt's indep baseline, supportive family and excellent rehab potential, recommend d/c to [facility] for intensive therapies to progress to mod I level prior to home. Of note, pt has seemingly had drastic improvement since his PT session 1/29 and would now be appropriate for AIR.     Recommendations for follow up therapy are one component of a multi-disciplinary discharge planning process, led by the attending physician.  Recommendations may be updated based on patient status, additional functional criteria and insurance authorization.   Follow Up Recommendations  Acute inpatient rehab (3hours/day)       Patient can return home with the following A lot of help with walking and/or transfers;A lot of help  with bathing/dressing/bathroom;Help with stairs or ramp for entrance;Assist for transportation;Assistance with feeding;Direct supervision/assist for medications management    Functional Status Assessment  Patient has had a recent decline in their functional status and demonstrates the ability to make significant improvements in function in a reasonable and predictable amount of time.  Equipment Recommendations  Other (comment) (defer to next venue)    Recommendations for Other Services Rehab consult     Precautions / Restrictions Precautions Precautions: Fall Restrictions Weight Bearing Restrictions: No      Mobility Bed Mobility Overal bed mobility: Needs Assistance Bed Mobility: Rolling, Supine to Sit, Sit to Supine Rolling: Min assist   Supine to sit: Min assist Sit to supine: Min assist   General bed mobility comments: min A for verbal cues, coordination, balance and safety    Transfers Overall transfer level: Needs assistance Equipment used: 1 person hand held assist Transfers: Sit to/from Stand Sit to Stand: Min assist           General transfer comment: min A for standing at the bed side, once standing pt was close min G. min A for side steps at the bed side due to impaired coordination and balance. No RW in the room this date, [facility] assist.      Balance Overall balance assessment: Needs assistance Sitting-balance support: Feet supported Sitting balance-Leahy Scale: Fair Sitting balance - Comments: initally min A to steady in sitting with report of some dizziness. Ultimately min G-supervision for sitting ~10 minutes   Standing balance support: Bilateral upper extremity supported Standing balance-Leahy Scale: Poor  ADL either performed or assessed with clinical judgement   ADL Overall ADL's : Needs assistance/impaired Eating/Feeding: Minimal assistance;Sitting Eating/Feeding Details (indicate cue type and reason): dys  3, thin Grooming: Minimal assistance;Sitting   Upper Body Bathing: Moderate assistance;Sitting   Lower Body Bathing: Moderate assistance;Sit to/from stand   Upper Body Dressing : Minimal assistance;Sitting   Lower Body Dressing: Moderate assistance;Sit to/from stand   Toilet Transfer: Moderate assistance;Ambulation   Toileting- Clothing Manipulation and Hygiene: Moderate assistance;Sit to/from stand       Functional mobility during ADLs: Moderate assistance;Cueing for safety General ADL Comments: pt required cues fro all sequencing and safety. overall limited by impaire cognition, balance, coordination and weakness. Pt with vision changes as well     Vision Baseline Vision/History: 0 No visual deficits Patient Visual Report: Blurring of vision Vision Assessment?: Yes Eye Alignment: Within Functional Limits Ocular Range of Motion: Impaired-to be further tested in functional context Alignment/Gaze Preference: Within Defined Limits Tracking/Visual Pursuits: Right eye does not track laterally;Unable to hold eye position out of midline;Impaired - to be further tested in functional context Saccades: Additional eye shifts occurred during testing Visual Fields: Right visual field deficit Additional Comments: Pt having difficulty communicating impairments of his vision. His R eye did not track laterally well. Pt unable to locate the 'volume' button on the remote and required max cues to locate the red button when placed in front of him. "I am having trouble seeing it right now." Denies double vision.            Pertinent Vitals/Pain Pain Assessment Pain Assessment: No/denies pain        Extremity/Trunk Assessment Upper Extremity Assessment Upper Extremity Assessment: RUE deficits/detail RUE Deficits / Details: impaired ROM, able to move actively against gravity. globally 3-5. imapired finger to nose coordination. slow and deliberate thumb to finger. RUE Coordination: decreased  fine motor;decreased gross motor   Lower Extremity Assessment Lower Extremity Assessment: Defer to PT evaluation   Cervical / Trunk Assessment Cervical / Trunk Assessment: Kyphotic   Communication Communication Communication: Expressive difficulties (some word finding difficulties noted)   Cognition Arousal/Alertness: Awake/alert Behavior During Therapy: Flat affect, WFL for tasks assessed/performed Overall Cognitive Status: Impaired/Different from baseline Area of Impairment: Orientation, Attention, Memory, Following commands, Safety/judgement, Awareness, Problem solving                 Orientation Level: Disoriented to, Time, Situation Current Attention Level: Selective Memory: Decreased short-term memory Following Commands: Follows one step commands consistently Safety/Judgement: Decreased awareness of safety, Decreased awareness of deficits Awareness: Emergent Problem Solving: Slow processing, Requires verbal cues General Comments: Pt oriented to self, place and year this date. He followed all one step commands well, some moments of confusion throughout. After discussion of IPR, pt stated "is the pressure good, water pressure?" Pt has limited insight to deficits and safety.     General Comments  VSS on RA, Diastoic BP at 109, but similar to previous BPs on monitor. Pt on 4L O2 uponarrival, removed nasal canula and pt was >94% SpO2 on RA throughout. RN notified     Home Living Family/patient expects to be discharged to:: Private residence Living Arrangements: Other relatives Available Help at Discharge: Family Type of Home: House Home Access: Stairs to enter [employer] of Steps: "just a few"   Home Layout: One level     Bathroom Shower/Tub: Chief Strategy Officer: Standard     Home Equipment: None      Lives With: Spouse;Son (  2 boys at home, 14 and 16)    Prior Functioning/Environment Prior Level of Function : Independent/Modified  Independent;Working/employed;Driving             Mobility Comments: indep ADLs Comments: works for facility services, unable to state the company name        OT Problem List: Decreased strength;Decreased range of motion;Decreased activity tolerance;Impaired balance (sitting and/or standing);Impaired vision/perception;Decreased coordination;Decreased cognition;Decreased safety awareness;Decreased knowledge of use of DME or AE;Decreased knowledge of precautions;Impaired UE functional use      OT Treatment/Interventions: Self-care/ADL training;Therapeutic exercise;DME and/or AE instruction;Therapeutic activities;Cognitive remediation/compensation;Visual/perceptual remediation/compensation;Patient/family education;Balance training    OT Goals(Current goals can be found in the care plan section) Acute Rehab OT Goals Patient Stated Goal: did not state OT Goal Formulation: With patient Time For Goal Achievement: 10/28/21 Potential to Achieve Goals: Good ADL Goals Pt Will Perform Grooming: with modified independence;sitting Pt Will Perform Upper Body Dressing: with modified independence;sitting Pt Will Perform Lower Body Dressing: with modified independence;sit to/from stand Pt Will Transfer to Toilet: with modified independence;ambulating  OT Frequency: Min 2X/week       AM-PAC OT "6 Clicks" Daily Activity     Outcome Measure Help from another person eating meals?: A Little Help from another person taking care of personal grooming?: A Little Help from another person toileting, which includes using toliet, bedpan, or urinal?: A Lot Help from another person bathing (including washing, rinsing, drying)?: A Lot Help from another person to put on and taking off regular upper body clothing?: A Little Help from another person to put on and taking off regular lower body clothing?: A Lot 6 Click Score: 15   End of Session Equipment Utilized During Treatment: Gait belt Nurse Communication:  Mobility status (pt on RA)  Activity Tolerance: Patient tolerated treatment well Patient left: in bed;with call bell/phone within reach;with bed alarm set  OT Visit Diagnosis: Unsteadiness on feet (R26.81);Other abnormalities of gait and mobility (R26.89);Muscle weakness (generalized) (M62.81);Hemiplegia and hemiparesis;Low vision, both eyes (H54.2) Hemiplegia - Right/Left: Right Hemiplegia - dominant/non-dominant: Dominant Hemiplegia - caused by: Cerebral infarction                Time: 0800-0825 OT Time Calculation (min): 25 min Charges:  OT General Charges $OT Visit: 1 Visit OT Evaluation $OT Eval Moderate Complexity: 1 Mod OT Treatments $Therapeutic Activity: 8-22 mins  Steffon Gladu A Copelan Maultsby 10/14/2021, 9:54 AM

## 2021-10-14 NOTE — TOC Benefit Eligibility Note (Signed)
Patient Product/process development scientist completed.    The patient is currently admitted and upon discharge could be taking Eliquis 5 mg.  The current 30 day co-pay is, $47.00.   The patient is currently admitted and upon discharge could be taking Farxiga 10 mg.  The current 30 day co-pay is, $47.00.   The patient is currently admitted and upon discharge could be taking Jardiance 10 mg.  The current 30 day co-pay is, $47.00.   The patient is insured through [facility] [employer]     Roland Earl, CPhT Pharmacy Patient Advocate Specialist [facility] Patient Advocate Team Direct Number: [phone]  Fax: [phone]

## 2021-10-14 NOTE — Progress Notes (Signed)
Peripherally Inserted Central Catheter Placement  The IV Nurse has discussed with the patient and/or persons authorized to consent for the patient, the purpose of this procedure and the potential benefits and risks involved with this procedure.  The benefits include less needle sticks, lab draws from the catheter, and the patient may be discharged home with the catheter. Risks include, but not limited to, infection, bleeding, blood clot (thrombus formation), and puncture of an artery; nerve damage and irregular heartbeat and possibility to perform a PICC exchange if needed/ordered by physician.  Alternatives to this procedure were also discussed.  Bard Power PICC patient education guide, fact sheet on infection prevention and patient information card has been provided to patient /or left at bedside.  PICC placed by Quin Hoop, RN   PICC Placement Documentation  PICC Double Lumen 10/14/21 PICC Right Brachial 36 cm 0 cm (Active)  Indication for Insertion or Continuance of Line Vasoactive infusions;Chronic illness with exacerbations (CF, Sickle Cell, etc.) 10/14/21 1614  Exposed Catheter (cm) 0 cm 10/14/21 1614  Site Assessment Clean;Dry;Intact 10/14/21 1614  Lumen #1 Status Flushed;Saline locked;Blood return noted 10/14/21 1614  Lumen #2 Status Flushed;Saline locked;Blood return noted 10/14/21 1614  Dressing Type Transparent 10/14/21 1614  Dressing Status Clean;Dry;Intact 10/14/21 1614  Antimicrobial disc in place? Yes 10/14/21 1614  Safety Lock Not Applicable [id number]  Line Care Connections checked and tightened 10/14/21 1614  Dressing Intervention New dressing 10/14/21 [id number]  Dressing Change Due 10/21/21 10/14/21 [place], Nicolette Bang 10/14/2021, 4:15 PM

## 2021-10-14 NOTE — Consult Note (Addendum)
Advanced Heart Failure Team Consult Note   Primary Physician: Judd Lien, PA-C PCP-Cardiologist:  None  Reason for Consultation: Acute on chronic systolic CHF  HPI:    Oscar Rosales is seen today for evaluation of acute on chronic systolic CHF at the request of DR. Sol Blazing with Internal Medicine service.   56 y.o. male with history of chronic systolic CHF EF 25-30%, NICM (felt to be d/t HTN), CKD IIIb, iron deficiency anemia, dyslipidemia, hx positive ANA, hx hepatitis C (viral load undetectable), untreated OSA. Follows in HF clinic at [facility]. He was diagnosed with HF in 2018.   At one point EF improved to 45-50%. EF subsequently declined and was 25-30% in June 2021 in setting of uncontrolled hypertension. Prior HIV, TSH, SPEP negative. Has history of noncompliance with medical therapy. Prior concern for memory impairment. Had normal cognitive evaluation in 12/22.   He was seen in HF clinic on 01/25. He was volume overloaded and given IV lasix. He had been noncompliant with medications.   Presented 10/11/21 after syncopal episode at work. Was confused and having difficulty speaking after waking up. MRI showed multiple scattered infarcts. CTA with evidence of PE. He was admitted for further workup and management. Appeared volume overloaded. Started on IV lasix. Urine output has been sluggish. Scr trending up from 1.88 > 2.08 > 2.22 (baseline 1.7).   Appears lethargic this morning. However, easily aroused. Reports dyspnea and orthopnea. No CP.   Having some word finding difficulty.   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 Admits he does not take his medications as prescribed. Lives with his wife in [place].   Review of Systems: [y] = yes, [ ]  = no  Limited by word finding difficulty General: Weight gain [ ] ; Weight loss [ ] ; Anorexia [ ] ; Fatigue [ ] ; Fever [ ] ; Chills [ ] ; Weakness [ ]   Cardiac: Chest pain/pressure [ ] ; Resting SOB [Y]; Exertional SOB [Y]; Orthopnea [Y]; Pedal Edema  [Y]; Palpitations [ ] ; Syncope [ ] ; Presyncope [ ] ; Paroxysmal nocturnal dyspnea[ ]   Pulmonary: Cough [ ] ; Wheezing[ ] ; Hemoptysis[ ] ; Sputum [ ] ; Snoring [ ]   GI: Vomiting[ ] ; Dysphagia[ ] ; Melena[ ] ; Hematochezia [ ] ; Heartburn[ ] ; Abdominal pain [ ] ; Constipation [ ] ; Diarrhea [ ] ; BRBPR [ ]   GU: Hematuria[ ] ; Dysuria [ ] ; Nocturia[ ]   Vascular: Pain in legs with walking [ ] ; Pain in feet with lying flat [ ] ; Non-healing sores [ ] ; Stroke [Y]; TIA [ ] ; Slurred speech [ ] ;  Neuro: Headaches[ ] ; Vertigo[ ] ; Seizures[ ] ; Paresthesias[ ] ;Blurred vision [ ] ; Diplopia [ ] ; Vision changes [ ]   Ortho/Skin: Arthritis [ ] ; Joint pain [ ] ; Muscle pain [ ] ; Joint swelling [ ] ; Back Pain [ ] ; Rash [ ]   Psych: Depression[ ] ; Anxiety[ ]   Heme: Bleeding problems [ ] ; Clotting disorders [ ] ; Anemia [ ]   Endocrine: Diabetes [ ] ; Thyroid dysfunction[ ]   Home Medications Prior to Admission medications   Medication Sig Start Date End Date Taking? Authorizing Provider  carvedilol (COREG) 25 MG tablet Take 37.5 mg by mouth 2 (two) times daily. 09/09/20  Yes [provider]  cloNIDine (CATAPRES - DOSED IN MG/24 HR) 0.3 mg/24hr patch Take 0.3 mg by mouth once a week. 08/13/21  Yes [provider]  digoxin (LANOXIN) 0.125 MG tablet Take 125 mcg by mouth every other day. 11/15/20  Yes [provider]  ergocalciferol (VITAMIN D2) 1.25 MG (50000 UT) capsule Take 1  capsule by mouth every Tuesday. 11/13/20  Yes [provider]  ferrous sulfate 325 (65 FE) MG tablet Take 325 mg by mouth every morning. 11/21/20  Yes [provider]  hydrALAZINE (APRESOLINE) 100 MG tablet Take 100 mg by mouth 2 (two) times daily. 11/20/20  Yes [provider]  isosorbide mononitrate (IMDUR) 60 MG 24 hr tablet Take 60 mg by mouth 2 (two) times daily. 11/20/20  Yes [provider]  losartan (COZAAR) 25 MG tablet Take 25 mg by mouth daily. 09/30/20  Yes [provider]  magnesium  oxide (MAG-OX) 400 MG tablet Take 400 mg by mouth 2 (two) times daily. 11/21/20  Yes [provider]  Multiple Vitamin (MULTIVITAMIN WITH MINERALS) TABS tablet Take 1 tablet by mouth daily.   Yes [provider]  torsemide (DEMADEX) 20 MG tablet Take 40 mg by mouth in the morning and at bedtime. 11/13/20  Yes [provider]  Vitamin D, Cholecalciferol, 25 MCG (1000 UT) TABS Take 1,000 Units by mouth daily.   Yes [provider]  potassium chloride SA (KLOR-CON) 20 MEQ tablet Take 1 tablet (20 mEq total) by mouth 2 (two) times daily for 3 days. 11/29/20 12/02/20  Sponseller, Gypsy Balsam, PA-C    Past Medical History: Past Medical History:  Diagnosis Date   CHF (congestive heart failure) ([place])    Hypertension    Renal disorder     Past Surgical History: History reviewed. No pertinent surgical history.  Family History: History reviewed. No pertinent family history.  Social History: Social History   Socioeconomic History   Marital status: Married    Spouse name: Not on file   Number of children: Not on file   Years of education: Not on file   Highest education level: Not on file  Occupational History   Not on file  Tobacco Use   Smoking status: Never   Smokeless tobacco: Never  Substance and Sexual Activity   Alcohol use: Not on file   Drug use: Never   Sexual activity: Not on file  Other Topics Concern   Not on file  Social History Narrative   Not on file   Social Determinants of Health   Financial Resource Strain: Not on file  Food Insecurity: Not on file  Transportation Needs: Not on file  Physical Activity: Not on file  Stress: Not on file  Social Connections: Not on file    Allergies:  Allergies  Allergen Reactions   Lisinopril Cough    SWITCHED TO LOSARTAN    Objective:    Vital Signs:   Temp:  [97.4 F (36.3 C)-98 F (36.7 C)] 97.7 F (36.5 C) (01/30 1120) Pulse Rate:  [70-95] 70 (01/30 1120) Resp:  [16-29] 16 (01/30  1120) BP: (124-135)/(100-108) 124/100 (01/30 1120) SpO2:  [98 %-100 %] 98 % (01/30 1120)    Weight change: Filed Weights   10/11/21 1435  Weight: 77.1 kg    Intake/Output:   Intake/Output Summary (Last 24 hours) at 10/14/2021 1210 Last data filed at 10/14/2021 0700 Gross per 24 hour  Intake --  Output 825 ml  Net -825 ml      Physical Exam    General:  Thin, chronically ill appearing male HEENT: normal Neck: supple. JVD 10 cm . Carotids 2+ bilat; no bruits. No lymphadenopathy or thyromegaly appreciated. Cor: PMI nondisplaced. Regular rate & rhythm. No rubs, gallops or murmurs. Lungs: clear Abdomen: soft, nontender, nondistended. No hepatosplenomegaly.  Extremities: no cyanosis, clubbing, rash, 1-2+ edema  Neuro: alert & orientedx3, cranial nerves grossly intact. moves all 4 extremities w/o difficulty. Affect pleasant   Telemetry   SR 70s-80s (personally reviewed)  EKG    SR 84 bpm, poor R wave progression  Labs   Basic Metabolic Panel: Recent Labs  Lab 10/11/21 1330 10/11/21 1338 10/12/21 0117 10/13/21 0055 10/14/21 0234  NA 139 139   139 140 140 137  K 3.4* 3.5   3.5 3.2* 3.5 4.4  CL 103 102 104 103 105  CO2 24  --  27 24 21*  GLUCOSE 93 91 123* 109* 101*  BUN 27* 34* 24* 29* 41*  CREATININE 1.88* 1.90* 1.82* 2.08* 2.22*  CALCIUM 8.3*  --  8.4* 8.4* 8.2*    Liver Function Tests: Recent Labs  Lab 10/11/21 1330 10/12/21 0117  AST 30 26  ALT 27 25  ALKPHOS 61 58  BILITOT 1.4* 1.3*  PROT 5.2* 5.2*  ALBUMIN 2.6* 2.5*   No results for input(s): LIPASE, AMYLASE in the last 168 hours. No results for input(s): AMMONIA in the last 168 hours.  CBC: Recent Labs  Lab 10/11/21 1330 10/11/21 1338 10/12/21 0117 10/13/21 0055 10/14/21 0234  WBC 6.5  --  8.2 11.7* 8.9  HGB 14.1 14.6   15.0 15.0 14.8 14.3  HCT 45.3 43.0   44.0 46.2 43.8 43.4  MCV 100.2*  --  96.0 95.8 97.3  PLT 165  --  197 166 127*    Cardiac Enzymes: Recent Labs  Lab  10/11/21 1330  CKTOTAL 290    BNP: BNP (last 3 results) Recent Labs    10/11/21 1245  BNP 3,942.1*    ProBNP (last 3 results) No results for input(s): PROBNP in the last 8760 hours.   CBG: No results for input(s): GLUCAP in the last 168 hours.  Coagulation Studies: Recent Labs    10/11/21 1330 10/12/21 0117  LABPROT 15.7* 16.1*  INR 1.3* 1.3*     Imaging   No results found.   Medications:     Current Medications:  dextromethorphan-guaiFENesin  1 tablet Oral BID   furosemide  80 mg Intravenous BID   rosuvastatin  20 mg Oral Daily    Infusions:  heparin 1,200 Units/hr (10/14/21 1027)      Patient Profile   56 y.o. male with hx chronic systolic CHF/NICM, HTN, CKD IIIb, hx hepatitis C infection, noncompliance with medical therapy. Admitted with CVA, PE and acute on chronic systolic CHF  Assessment/Plan   Acute on chronic systolic CHF/NICM: -Diagnosed 2018. Normal coronaries on LHC in 2019. Felt to be due to uncontrolled hypertension. -EF previously recovered to 45-50%.  -EF down to 25-30% in 06/21 in setting of uncontrolled HTN.  -Echo 01/23: EF 15-20%, RV moderately reduced, severe biatrial enlargement, moderate MR, moderate TR, moderate AI -Appears volume overloaded. BNP > 3,900. Sluggish UOP with IV lasix 80 BID. Scr trending up to 2.2. Concern for low output HF.  -Place PICC line. Ordered CVP and co-ox monitoring. May need milrinone. -No beta blocker with a/c CHF -Now off losartan with AKI on CKD -Eventual SGLT2i. A1c 5.2% -Can consider imdur/hydralazine -May need RHC depending on coarse -Likely not candidate for advanced therapies currently d/t noncompliance and renal impairment.  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 Scattered infarcts on MRI brain -No large vessel occlusion on CTA head and neck -Negative bubble study on echo -Neurology following  3. PE: -Incidentally noted on CTA chest -On heparin gtt -LE dopplers negative for  DVT -Hypercoagulable labs pending  4.  AKI on CKD IIIb: -Baseline Scr 1.7 -Up to 2.22 today -? Low output HF -Follow closely   Length of Stay: 2  [name], LINDSAY N, PA-C  10/14/2021, 12:10 PM  Advanced Heart Failure Team Pager [phone] (M-F; 7a - 5p)  Please contact [facility] for night-coverage after hours (4p -7a ) and weekends on [email or web address]   Patient seen and examined with the above-signed Advanced Practice Provider and/or Housestaff. I personally reviewed laboratory data, imaging studies and relevant notes. I independently examined the patient and formulated the important aspects of the plan. I have edited the note to reflect any of my changes or salient points. I have personally discussed the plan with the patient and/or family.   56 y/o male with HTN, severe systolic HF due to NICM, CKD 3b. Has been followed at [facility]. Recent EF 25-30%.   Admitted here with syncope at work. Was confused and having difficulty speaking after waking up. MRI showed multiple scattered infarcts. CTA with evidence of PE. Also noted to have volume overload. Echo with biventricular hypertrophy/dysfunction EF 15-20% Moderate MR moderate RV dysfunction.  Has had sluggish response to IV lasix with rising SCr.    He is lethargic but responds to commands.   General:  Weak appearing. Lethargic No resp difficulty HEENT: normal Neck: supple.JVP jaw  Carotids 2+ bilat; no bruits. No lymphadenopathy or thryomegaly appreciated. Cor: PMI nondisplaced. Regular rate & rhythm. +s3  3/6 M Lungs: clear Abdomen: soft, nontender, nondistended. No hepatosplenomegaly. No bruits or masses. Good bowel sounds. Extremities: no cyanosis, clubbing, rash, edema cool Neuro: awake but lethargic cranial nerves grossly intact. moves all 4 extremities w/o difficulty. Affect pleasant  Suspect low output HF. PICC now in place. Check co-ox and CVP. Will move to [place]. Will need inotropic support. I am not sure he will be candidate for  advanced therapies but will see if we can turn him around.   Agree with w/u for TTR once stable.   Message sent to Dr. Saverio Danker   CRITICAL CARE Performed by: Glori Bickers  Total critical care time: 45 minutes  Critical care time was exclusive of separately billable procedures and treating other patients.  Critical care was necessary to treat or prevent imminent or life-threatening deterioration.  Critical care was time spent personally by me (independent of midlevel providers or residents) on the following activities: development of treatment plan with patient and/or surrogate as well as nursing, discussions with consultants, evaluation of patient's response to treatment, examination of patient, obtaining history from patient or surrogate, ordering and performing treatments and interventions, ordering and review of laboratory studies, ordering and review of radiographic studies, pulse oximetry and re-evaluation of patient's condition.   Glori Bickers, MD  5:06 PM

## 2021-10-14 NOTE — Progress Notes (Signed)
VASCULAR LAB    Spoke with Elease Hashimoto, RN, regarding placing an additional IV for TCD Bubble study, per Dr. Pearlean Brownie.   Aadarsh Cozort, RVT 10/14/2021, 1:56 PM

## 2021-10-14 NOTE — Progress Notes (Signed)
HD#2 Subjective:  Overnight Events: none  Patient assessed at bedside.  He is somnolent but arousable to verbal stimulation.  He is able to communicate and follow commands.  Said that his p.o. intake has been poor.  Endorses mild shortness of breath.  Objective:  Vital signs in last 24 hours: Vitals:   10/13/21 2014 10/14/21 0004 10/14/21 0406 10/14/21 0742  BP:    (!) 133/108  Pulse:    83  Resp:    (!) 29  Temp: 97.9 F (36.6 C) (!) 97.5 F (36.4 C) 97.6 F (36.4 C) 98 F (36.7 C)  TempSrc: Oral Oral Oral Axillary  SpO2:    100%  Weight:      Height:       Supplemental O2: Nasal Cannula SpO2: 100 % O2 Flow Rate (L/min): 2 L/min   Physical Exam:  Physical Exam Constitutional:      General: He is not in acute distress.    Appearance: He is ill-appearing.     Comments: Somnolent but arousable to verbal stimulation  HENT:     Head: Normocephalic.  Eyes:     General:        Right eye: No discharge.        Left eye: No discharge.     Conjunctiva/sclera: Conjunctivae normal.  Cardiovascular:     Rate and Rhythm: Normal rate and regular rhythm.     Comments: +2 LE edema bilateral lower extremities Pulmonary:     Effort: Pulmonary effort is normal.     Comments: Mild increased work of breathing Abdominal:     General: Bowel sounds are normal.  Musculoskeletal:     Comments: Extremities are warm to touch  Skin:    General: Skin is warm.  Neurological:     Comments: Dilated pupil on the left side.  Intranuclear ophthalmoplegia noted Tongue deviates to the left. 4/5 strength of right upper and lower extremity 5/5 strength of left upper and lower extremities    Filed Weights   10/11/21 1435  Weight: 77.1 kg     Intake/Output Summary (Last 24 hours) at 10/14/2021 1052 Last data filed at 10/14/2021 0700 Gross per 24 hour  Intake --  Output 825 ml  Net -825 ml   Net IO Since Admission: -2,140.31 mL [10/14/21 1052]  Pertinent Labs: CBC Latest Ref Rng &  Units 10/14/2021 10/13/2021 10/12/2021  WBC 4.0 - 10.5 K/uL 8.9 11.7(H) 8.2  Hemoglobin 13.0 - 17.0 g/dL 14.3 14.8 15.0  Hematocrit 39.0 - 52.0 % 43.4 43.8 46.2  Platelets 150 - 400 K/uL 127(L) 166 197    CMP Latest Ref Rng & Units 10/14/2021 10/13/2021 10/12/2021  Glucose 70 - 99 mg/dL 101(H) 109(H) 123(H)  BUN 6 - 20 mg/dL 41(H) 29(H) 24(H)  Creatinine 0.61 - 1.24 mg/dL 2.22(H) 2.08(H) 1.82(H)  Sodium 135 - 145 mmol/L 137 140 140  Potassium 3.5 - 5.1 mmol/L 4.4 3.5 3.2(L)  Chloride 98 - 111 mmol/L 105 103 104  CO2 22 - 32 mmol/L 21(L) 24 27  Calcium 8.9 - 10.3 mg/dL 8.2(L) 8.4(L) 8.4(L)  Total Protein 6.5 - 8.1 g/dL - - 5.2(L)  Total Bilirubin 0.3 - 1.2 mg/dL - - 1.3(H)  Alkaline Phos 38 - 126 U/L - - 58  AST 15 - 41 U/L - - 26  ALT 0 - 44 U/L - - 25    Imaging: No results found.  Assessment/Plan:   Principal Problem:   Acute CVA (cerebrovascular accident) [facility]) Active Problems:  Hypertension, uncontrolled   Non-ischemic cardiomyopathy ([place])   Stage 3 chronic kidney disease ([place])   Acute pulmonary embolism ([place])   Patient Summary: Oscar Rosales is a 56 y.o. male with a pertinent PMH of nonischemic cardiomyopathy, HFrEF (EF 25-30% 02/2020), HLD, HTN, and CKD3b admitted for acute embolic infarctions in the setting of mild nonocclusive pulmonary emboli.   Multifocal ischemic infarcts  Suspecting embolic stroke Neuro exam unchanged from yesterday.  Plan to have a TEE and cardiac monitor after discharge. High risk for hemorrhagic conversion - Neurology consulted, follow up on recommendations  - Allow for permissive hypertension - Heparin infusion.  Will reach out to neurology about when to switch to DOAC - LDL of 114, continue Crestor 20 mg - Telemetry - Frequent neuro checks  - PT/OT/SLP.  Pending CIR approval   Mild nonocclusive pulmonary emboli  Unclear source of emboli.  No known risk factor.  TTE showed EF of 15-20% with severely decreased left ventricle function,  bubble study negative. LE dopplers negative for DVT.   -TEE this week - Continue on heparin infusion.   - Pending hypercoagulable study   AoC HFrEF exacerbation Nonischemic hypertensive cardiomyopathy Hypertension   Patient only put out 800 cc urine output last night but there was an unrecorded episode from incontinence.  Still volume on exam.  With his worsening kidney function and reduce diuresis, will consult cardiology for recommendations. - Continue Lasix 80 mg twice daily - Daily weights and I/O's - Cardiac monitoring  - Holding blood pressure medications.  Discontinue losartan for worsening kidney function.  Will hold the Coreg for now.   AKI on CKD3b Baseline of 1.6-1.8.  Creatinine continue to trend up to 2.28 today.  Could be cardiorenal in combination with prerenal.  Bladder scan only showed 9 cc this morning.  Obtain renal ultrasound to rule out hydronephrosis. - Will discontinue losartan to prevent further worsening of kidney function. - Continue IV Lasix - Trend BMP - Avoid nephrotoxic agents    Stable ascending thoracic aortic dilatation  4.2 cm in size.  - Annual imaging follow-up recommended by CTA or MRA     Best Practice: Diet: Dysphagia 3  IVF: None,None VTE: Heparin Code: Full PT/OT: CIR  Gaylan Gerold, DO 10/14/2021, 10:52 AM Pager: [phone]  Please contact the on call pager after 5 pm and on weekends at [phone].

## 2021-10-14 NOTE — Progress Notes (Signed)
Co-ox sent.

## 2021-10-14 NOTE — NC FL2 (Signed)
[place] LEVEL OF CARE SCREENING TOOL     IDENTIFICATION  Patient Name: Oscar Rosales Birthdate: Sep 16, 1965 Sex: male Admission Date (Current Location): 10/11/2021  [facility] and [place] Number:  Herbalist and Address:  [facility] [place] [address] [place], [place] [address]      Provider Number: [id number]  Attending Physician Name and Address:  Charise Killian, MD  Relative Name and Phone Number:       Current Level of Care: Hospital Recommended Level of Care: [place] Prior Approval Number:    Date Approved/Denied:   PASRR Number: [id number]  Discharge Plan: SNF    Current Diagnoses: Patient Active Problem List   Diagnosis Date Noted   Acute pulmonary embolism ([place]) 10/12/2021   Stroke ([place]) 10/11/2021   Noncompliance with medication regimen 02/15/2021   Hx of iron deficiency 03/26/2020   Dyslipidemia 04/18/2019   Non-ischemic cardiomyopathy ([place]) 06/13/2017   Stage 3 chronic kidney disease ([place]) 04/28/2017   Coronary artery disease involving native coronary artery of native heart without angina pectoris 02/16/2015   Hypertension, uncontrolled 02/16/2015    Orientation RESPIRATION BLADDER Height & Weight     Self, Situation, Place  Normal Incontinent Weight: 170 lb (77.1 kg) Height:  5\' 11"  (180.3 cm)  BEHAVIORAL SYMPTOMS/MOOD NEUROLOGICAL BOWEL NUTRITION STATUS      Incontinent Diet (see DC summary)  AMBULATORY STATUS COMMUNICATION OF NEEDS Skin   Extensive Assist Verbally Normal                       Personal Care Assistance Level of Assistance  Bathing, Feeding, Dressing Bathing Assistance: Maximum assistance Feeding assistance: Limited assistance Dressing Assistance: Maximum assistance     Functional Limitations Info             SPECIAL CARE FACTORS FREQUENCY  PT (By licensed PT), OT (By licensed OT), Speech therapy     PT Frequency: 5x/wk OT Frequency: 5x/wk     Speech  Therapy Frequency: 5x/wk      Contractures Contractures Info: Not present    Additional Factors Info  Code Status, Allergies Code Status Info: Full Allergies Info: Lisinopril           Current Medications (10/14/2021):  This is the current hospital active medication list Current Facility-Administered Medications  Medication Dose Route Frequency Provider Last Rate Last Admin   acetaminophen (TYLENOL) tablet 650 mg  650 mg Oral Q4H PRN Virl Axe, MD   650 mg at 10/14/21 [id number]   Or   acetaminophen (TYLENOL) 160 MG/5ML solution 650 mg  650 mg Per Tube Q4H PRN Virl Axe, MD       Or   acetaminophen (TYLENOL) suppository 650 mg  650 mg Rectal Q4H PRN Virl Axe, MD       dextromethorphan-guaiFENesin [facility] DM) 30-600 MG per 12 hr tablet 1 tablet  1 tablet Oral BID Christian, Rylee, MD   1 tablet at 10/13/21 2239   furosemide (LASIX) injection 80 mg  80 mg Intravenous BID Gaylan Gerold, DO   80 mg at 10/14/21 0839   heparin ADULT infusion 100 units/mL (25000 units/212mL)  1,100 Units/hr Intravenous Continuous Llana Aliment, RPH 11 mL/hr at 10/13/21 1256 1,100 Units/hr at 10/13/21 1256   rosuvastatin (CRESTOR) tablet 20 mg  20 mg Oral Daily Gaylan Gerold, DO   20 mg at 10/14/21 [id number]   senna-docusate (Senokot-S) tablet 1 tablet  1 tablet Oral QHS PRN Virl Axe, MD  Discharge Medications: Please see discharge summary for a list of discharge medications.  Relevant Imaging Results:  Relevant Lab Results:   Additional Information SS#: [id number]  Geralynn Ochs, LCSW

## 2021-10-14 NOTE — Progress Notes (Signed)
Inpatient Rehab Admissions Coordinator:   I spoke with Pt. Regarding Potential CIR admit. He states interest and that his wife can provide 24/7 support. I will reach out to her to discuss goals and expectations for the patient and to discuss disposition. I will open a case with Pt.'s insurance once updated PT note is in.   Megan Salon, MS, CCC-SLP Rehab Admissions Coordinator  [phone] (celll) [phone] (office)

## 2021-10-14 NOTE — Progress Notes (Signed)
Report given to nurse on [place].  Per cardiology PA lasix to be held until intrope started.  Patient in-transit to ultrasound for renal US and then will be transported to new room 2C17.  Patient's spouse notified of transfer. No patient belongings at bedside.

## 2021-10-14 NOTE — Progress Notes (Addendum)
STROKE TEAM PROGRESS NOTE   INTERVAL HISTORY Patient is seen in his room with no family at the bedside.  He has been hemodynamically stable and his neurological exam is stable.  He becomes short of breath and fatigued with very little exertion.  Plan is for TCD bubble study today and TEE Wednesday. Vital signs are stable.  Neurological exam is unchanged Vitals:   10/14/21 0004 10/14/21 0406 10/14/21 0742 10/14/21 1120  BP:   (!) 133/108 (!) 124/100  Pulse:   83 70  Resp:   (!) 29 16  Temp: (!) 97.5 F (36.4 C) 97.6 F (36.4 C) 98 F (36.7 C) 97.7 F (36.5 C)  TempSrc: Oral Oral Axillary Axillary  SpO2:   100% 98%  Weight:      Height:       CBC:  Recent Labs  Lab 10/13/21 0055 10/14/21 0234  WBC 11.7* 8.9  HGB 14.8 14.3  HCT 43.8 43.4  MCV 95.8 97.3  PLT 166 127*    Basic Metabolic Panel:  Recent Labs  Lab 10/13/21 0055 10/14/21 0234  NA 140 137  K 3.5 4.4  CL 103 105  CO2 24 21*  GLUCOSE 109* 101*  BUN 29* 41*  CREATININE 2.08* 2.22*  CALCIUM 8.4* 8.2*    Lipid Panel:  Recent Labs  Lab 10/12/21 0117  CHOL 167  TRIG 40  HDL 45  CHOLHDL 3.7  VLDL 8  LDLCALC 114*    HgbA1c:  Recent Labs  Lab 10/11/21 2256  HGBA1C 5.2    Urine Drug Screen:  Recent Labs  Lab 10/11/21 1358  LABOPIA NONE DETECTED  COCAINSCRNUR NONE DETECTED  LABBENZ NONE DETECTED  AMPHETMU NONE DETECTED  THCU NONE DETECTED  LABBARB NONE DETECTED     Alcohol Level  Recent Labs  Lab 10/11/21 1330  ETH <10     IMAGING past 24 hours No results found.  PHYSICAL EXAM  Physical Exam  Constitutional: Ill appearing, frail middle-aged [place] American male.  Cardiovascular: Tachycardic and regular rhythm.  Respiratory: Labored, tachypneic and short of breath    NEURO:  Mental Status: AA&Ox3  Speech/Language: speech is without dysarthria or aphasia.    Cranial Nerves:  II: PERRL.  III, IV, VI: Restricted upward gaze and difficulties with horizontal eye movement.  Bilateral ptosis V: Sensation is intact to light touch and symmetrical to face.  VII: Smile is symmetrical.  VIII: hearing intact to voice. IX, X: Phonation is normal.  XII: tongue is midline without fasciculations. Motor: 5/5 strength to LUE, LLE and RLE, 4/5 strength in RUE Tone: is normal and bulk is normal Sensation- Intact to light touch bilaterally.   Coordination: FTN intact bilaterally, incoordination with fine motor movements in right hand, left hand orbits right hand Gait- deferred    ASSESSMENT/PLAN Mr. Lamontez Kleist is a 56 y.o. male with history of CHF, ascending thoracic aortic dilation, HTN, noncompliance with medications, CKD III presenting with AMS. He states that he woke up feeling tired. Went to work, and shortly after abruptly started to feel lightheaded and before he knew it, he passed out. Does not remember falling down, but woke up on ground. Unsure how long he was down, possibly 10-20 minutes. No witnesses of the the event. Someone found him down on the bathroom and called EMS. 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 He is currently on a heparin drip. MRI head shows scattered infarcts involving the right cerebellar hemisphere,  left cerebral peduncle, left hippocampus, left thalamus, right occipital lobe, and left pre and postcentral gyri, some of which appear acute while others appear more subacute in chronicity. He will need a TEE and cardiac monitor (30 day or loop), and possible hypercoag panel and cardiology consult. TCD bubble study to be performed today and TEE scheduled for Wednesday.  Stroke:  Scattered infarcts involving multiple territories likely secondary embolic cryptogenic source.  Concurrent pulmonary emboli raise possibility of hypercoagulability or paradoxical embolism Code Stroke- No acute abnormality. Small vessel disease.  CTA head & neck No large vessel occlusion, hemodynamically  significant stenosis, or evidence of dissection. 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  CXR- Cardiomegaly. There is pulmonary vascular congestion and pulmonary edema in both lungs. Possibility of underlying pneumonia is not excluded MRI- Scattered infarcts involving the right cerebellar hemisphere, left cerebral peduncle, left hippocampus, left thalamus, right occipital lobe, and left pre and postcentral gyri, some of which appear acute while others appear more subacute in chronicity. CTA Chest- Mild nonocclusive pulmonary emboli within the right middle lobe pulmonary artery and right lower lobe medial segment pulmonary artery. Marked cardiomegaly, possibly mildly worsened from 08/26/2019. Small right and trace left pleural effusions. Scattered ground-glass opacities compatible with pulmonary edema, subsegmental atelectasis, and/or a mild infectious/inflammatory process 2D Echo - EF 15-20%, severely decrease LV function BLE Venous duplex- No evidence of DVT in lower extremities LDL 114 HgbA1c 5.2 VTE prophylaxis - Heparin IV    Diet   DIET DYS 3 Room service appropriate? Yes; Fluid consistency: Thin   No antithrombotic prior to admission, now on heparin IV.  Therapy recommendations:  CIR Disposition:  Pending  Pulmonary Embolism Heparin IV protocol Management by primary team  AoC HFrEF exacerbation Home meds: Imdur, digoxin, torsemide Recent titration of torsemide r/t increasing BNP and sodium intake Echo pending  Hypertension Home meds:  hydralazine 100mg , carvedilol, clonidine 0.3 patch, cozaar Unstable- non compliant with home medication for approximately 3 months Permissive hypertension (OK if < 220/120) but gradually normalize in 5-7 days Long-term BP goal normotensive  Hyperlipidemia Home meds:  None LDL 114, goal < 70 Add Crestor  High intensity statin not indicated  Continue statin at discharge  Other Stroke Risk  Factors Coronary artery disease Congestive heart failure Seen by [facility] Consider cardiology consult   Chronic Kidney Disease- follows with [facility] nephrology Dr. Olivia Mackie Monitor I&O Creatinine baseline 1.7-2.0 Cr elevated 1.8-> 2.08  Variably positive ANA  Antibody testing negative. His initial ANA was negative, retest positive, antibody screening reassuring Done outpatient at [facility] day # 2  Patient seen and examined by NP/APP with MD. MD to update note as needed.   [place] , MSN, AGACNP-BC [facility] See Amion for schedule and pager information 10/14/2021 12:33 PM   I have personally obtained history,examined this patient, reviewed notes, independently viewed imaging studies, participated in medical decision making and plan of care.ROS completed by me personally and pertinent positives fully documented  I have made any additions or clarifications directly to the above note. Agree with note above.  Patient is oriented with multiple by cerebral embolic strokes in the setting of concurrent acute pulmonary embolism raising possibility of hypercoagulability versus paradoxical embolism.  Recommend check transcranial Doppler bubble study for PFO and TEE.  Continue ongoing stroke work-up.  Aggressive risk factor modification.  Long discussion with patient and answer questions.  Greater than 50%  time during this 35-minute visit was spent in counseling and coordination of care and discussion with patient and care team and answering questions about stroke prevention.  Antony Contras, MD Medical Director [facility] Pager: [phone] 10/14/2021 3:25 PM   To contact Stroke Continuity provider, please refer to [email or web address] After hours, contact General Neurology

## 2021-10-14 NOTE — Progress Notes (Signed)
Inpatient Rehab Admissions Coordinator:   Per OT recommendation, patient was screened for CIR candidacy by [place], MS, CCC-SLP. At this time, Pt. Appears to be a a potential candidate for CIR. I will place   order for rehab consult per protocol for full assessment. Please contact me any with questions.  Lolita Faulds, MS, CCC-SLP Rehab Admissions Coordinator  [phone] (celll) [phone] (office)  

## 2021-10-14 NOTE — Progress Notes (Signed)
ANTICOAGULATION CONSULT NOTE - Follow Up Consult  Pharmacy Consult for Heparin Indication: pulmonary embolus and stroke  Allergies  Allergen Reactions   Lisinopril Cough    SWITCHED TO LOSARTAN    Patient Measurements: Height: 5\' 11"  (180.3 cm) Weight: 77.1 kg (170 lb) IBW/kg (Calculated) : 75.3 Heparin Dosing Weight: 77.1 kg   Vital Signs: Temp: 98 F (36.7 C) (01/30 0742) Temp Source: Axillary (01/30 0742) BP: 133/108 (01/30 0742) Pulse Rate: 83 (01/30 0742)  Labs: Recent Labs    10/11/21 1330 10/11/21 1338 10/11/21 1644 10/11/21 2256 10/12/21 0117 10/12/21 0758 10/12/21 1520 10/13/21 0055 10/14/21 0234  HGB 14.1   < >  --   --  15.0  --   --  14.8 14.3  HCT 45.3   < >  --   --  46.2  --   --  43.8 43.4  PLT 165  --   --   --  197  --   --  166 127*  LABPROT 15.7*  --   --   --  16.1*  --   --   --   --   INR 1.3*  --   --   --  1.3*  --   --   --   --   HEPARINUNFRC  --   --   --    < >  --    < > 0.51 0.39 0.29*  CREATININE 1.88*   < >  --   --  1.82*  --   --  2.08* 2.22*  CKTOTAL 290  --   --   --   --   --   --   --   --   TROPONINIHS 48*  --  50*  --   --   --   --   --   --    < > = values in this interval not displayed.    Estimated Creatinine Clearance: 39.6 mL/min (A) (by C-G formula based on SCr of 2.22 mg/dL (H)).   Medications:  Scheduled:   dextromethorphan-guaiFENesin  1 tablet Oral BID   furosemide  80 mg Intravenous BID   rosuvastatin  20 mg Oral Daily   Infusions:   heparin 1,100 Units/hr (10/13/21 1256)    Assessment: 56 yo M continues on IV heparin for PE and scattered embolic infarcts.  Heparin level slightly below goal on 1100 units/hr.  No bleeding noted.  Will target lower end of heparin goal in the setting of acute infarcts as well.  Goal of Therapy:  Heparin level 0.3-0.5 units/ml Monitor platelets by anticoagulation protocol: Yes   Plan:  Increase heparin to 1200 units/hr Heparin level and CBC with AM  labs. Follow-up plans for oral anticoagulation. Monitor for s/sx bleeding.  [employer], Pharm.D., BCPS Clinical Pharmacist Clinical phone for 10/14/2021 from 7:30-3:00 is [phone].  **Pharmacist phone directory can be found on [email or web address] listed under [place].  10/14/2021 9:38 AM

## 2021-10-14 NOTE — Progress Notes (Signed)
CCMD reported 6 beats run of VT.  Pt said he has pain in his abdomen,  tylenol 650 given,  will continue monitoring.

## 2021-10-14 NOTE — Progress Notes (Addendum)
HD#2 Subjective:  Overnight Events: Started on dobutamine per cards  Patient assessed at bedside.  He states that he has some numbness in his chest that has been there for a while. His speech is improved this AM and he is more alert. He states that he feels stronger today. Endorses mild shortness of breath. No other concerns at this time.  Objective:  Vital signs in last 24 hours: Vitals:   10/14/21 2322 10/15/21 0314 10/15/21 0422 10/15/21 0901  BP: (!) 156/117 (!) 147/106  (!) 170/117  Pulse: 91 94  98  Resp:  20    Temp:  97.7 F (36.5 C)  (!) 97.2 F (36.2 C)  TempSrc:  Oral  Oral  SpO2:  97%  97%  Weight:   66.2 kg   Height:       Supplemental O2: Nasal Cannula SpO2: 97 % O2 Flow Rate (L/min): 3 L/min   Physical Exam:  Physical Exam Constitutional:      General: He is not in acute distress.    Appearance: He is ill-appearing.     Comments: Lethargic but able to communicate in full sentences  HENT:     Head: Normocephalic.  Eyes:     General:        Right eye: No discharge.        Left eye: No discharge.     Conjunctiva/sclera: Conjunctivae normal.  Cardiovascular:     Rate and Rhythm: Normal rate and regular rhythm.     Comments: +1 LE edema bilateral lower extremities Pulmonary:     Effort: Pulmonary effort is normal.  Abdominal:     General: Bowel sounds are normal.  Musculoskeletal:     Comments: Extremities are warm to touch  Skin:    General: Skin is warm.  Neurological:     Comments: Dilated pupil on the left side. Intranuclear ophthalmoplegia noted Tongue deviates to the right. Bilateral ptosis is present. 4/5 strength of right upper and lower extremity 5/5 strength of left upper and lower extremities Lack of coordination of fine motor movements of the right hand    Filed Weights   10/11/21 1435 10/14/21 1256 10/15/21 0422  Weight: 77.1 kg 74.9 kg 66.2 kg     Intake/Output Summary (Last 24 hours) at 10/15/2021 1138 Last data filed at  10/15/2021 0500 Gross per 24 hour  Intake 865.78 ml  Output 1250 ml  Net -384.22 ml   Net IO Since Admission: -2,624.53 mL [10/15/21 1138]  Pertinent Labs: CBC Latest Ref Rng & Units 10/15/2021 10/14/2021 10/13/2021  WBC 4.0 - 10.5 K/uL 8.5 8.9 11.7(H)  Hemoglobin 13.0 - 17.0 g/dL 33.8 25.0 53.9  Hematocrit 39.0 - 52.0 % 41.3 43.4 43.8  Platelets 150 - 400 K/uL 141(L) 127(L) 166    CMP Latest Ref Rng & Units 10/15/2021 10/14/2021 10/13/2021  Glucose 70 - 99 mg/dL 767(H) 419(F) 790(W)  BUN 6 - 20 mg/dL 40(X) 73(Z) 32(D)  Creatinine 0.61 - 1.24 mg/dL 9.24(Q) 6.83(M) 1.96(Q)  Sodium 135 - 145 mmol/L 139 137 140  Potassium 3.5 - 5.1 mmol/L 3.7 4.4 3.5  Chloride 98 - 111 mmol/L 103 105 103  CO2 22 - 32 mmol/L 26 21(L) 24  Calcium 8.9 - 10.3 mg/dL 8.3(L) 8.2(L) 8.4(L)  Total Protein 6.5 - 8.1 g/dL - - -  Total Bilirubin 0.3 - 1.2 mg/dL - - -  Alkaline Phos 38 - 126 U/L - - -  AST 15 - 41 U/L - - -  ALT  0 - 44 U/L - - -    Imaging: US RENAL  Result Date: 10/14/2021 CLINICAL DATA:  Acute renal injury EXAM: RENAL / URINARY TRACT ULTRASOUND COMPLETE COMPARISON:  None. FINDINGS: Right Kidney: Renal measurements: 9.3 x 4.0 x 5.5 cm. = volume: 105 mL. Diffuse increased echogenicity is noted. No mass lesion or hydronephrosis is noted. Left Kidney: Renal measurements: 12.0 x 5.4 x 4.3 cm. = volume: 146 mL. 5 cm upper pole simple cyst is noted. No mass lesion or hydronephrosis is noted. Diffuse increased echogenicity is seen. Bladder: Decompressed Other: None. IMPRESSION: Large left renal cyst. Increased echogenicity bilaterally consistent with medical renal disease. Electronically Signed   By: Alcide Clever M.D.   On: 10/14/2021 19:22   [place] EKG SITE RITE  Result Date: 10/14/2021 If Site Rite image not attached, placement could not be confirmed due to current cardiac rhythm.   Assessment/Plan:   Principal Problem:   Acute CVA (cerebrovascular accident) [facility]) Active Problems:   Hypertension,  uncontrolled   Non-ischemic cardiomyopathy (HCC)   Stage 3 chronic kidney disease (HCC)   Acute pulmonary embolism (HCC)   Patient Summary: Oscar Rosales is a 56 y.o. male with a pertinent PMH of nonischemic cardiomyopathy, HFrEF (EF 25-30% 02/2020), HLD, HTN, and CKD3b admitted for acute ischemic stroke 2/2 embolic infarctions, mild nonocclusive pulmonary emboli, and acute on chronic HFrEF exacerbation.   Multifocal ischemic brain infarcts Suspecting embolic stroke Neuro exam is overall unchanged from yesterday however patient is able to converse with [place] in full sentences today. Plan to have a TEE tomorrow and cardiac monitor after discharge. High risk for hemorrhagic conversion. - Neurology consulted, follow up on recommendations  - Will restart hydralazine 25 mg TID today, with an overall blood pressure goal of 140s-150 systolic - TCD bubble study today per neuro  - Heparin infusion. Okay to switch to DOAC when finished with procedures, discuss with cardiology - LDL of 114, continue Crestor 20 mg - Telemetry - Frequent neuro checks  - PT/OT/SLP. Pending CIR approval   Mild nonocclusive pulmonary emboli  Unclear source of emboli. No known risk factors. IgM cardiolipin antibody is borderline positive, however other Ig cardiolipin antibodies are negative. Otherwise, hypercoag labs are pending. TTE showed EF of 15-20% with severely decreased left ventricle function, bubble study negative. LE dopplers negative for DVT.   - TEE on Wednesday - Continue on heparin infusion, will likely need DOAC loading dose. - Follow-up on remaining hypercoagulable studies   AoC HFrEF exacerbation Nonischemic hypertensive cardiomyopathy Uncontrolled Hypertension   Patient put out 1.3 L of urine output last night. Still volume up on exam. Pt recently started on dobutamine per Cards.  - Continue Lasix 80 mg twice daily, dobutamine per cardiology - Daily weights and I/O's - Cardiac monitoring  - Restarted  hydral as above; hold off on restarting beta blocker, ARB for now--discuss restarting ARB when kidney function near baseline, beta blocker when HF stabilized   AKI on CKD3b Baseline of 1.6-1.8. Creatinine continue improved to 2.03 today from 2.22 yesterday. Likely cardiorenal from low output HF. - Continue IV Lasix as above - Trend BMP - Avoid nephrotoxic agents  Stable ascending thoracic aortic dilatation  4.2 cm in size. - Annual imaging follow-up recommended by CTA or MRA    Best Practice: Diet: Dysphagia 3  IVF: None, None VTE: Heparin Code: Full PT/OT: CIR  Andrey Campanile, MD 10/14/2021, 9:46 PM Pager: [phone]  Please contact the on call pager after 5 pm and on weekends  at [phone].

## 2021-10-14 NOTE — TOC Progression Note (Signed)
Transition of Care [facility]) - Progression Note    Patient Details  Name: Oscar Rosales MRN: [id number] Date of Birth: 05-26-1966  [facility]) CM/SW Contact  Pollie Friar, RN Phone Number: 10/14/2021, 11:02 AM  Clinical Narrative:    CM met with the patient but he was very lethargic and had a hard time staying awake. He did give CM permission to speak to family. CM called and spoke to patients spouse. She states she is attempting to find a job since patient will not be able to work in the near future.  CM went over the recommendations from therapies. She is asking for more information. CM has reached out to CIR to see if they feel he would be a candidate. They do feel he would be a candidate and will work him up for CIR. CM will follow and update wife later today.    Expected Discharge Plan: IP Rehab Facility Barriers to Discharge: Continued Medical Work up  Expected Discharge Plan and Services Expected Discharge Plan: [facility]   Discharge Planning Services: CM Consult Post Acute Care Choice: IP Rehab Living arrangements for the past 2 months: Single Family Home                                       Social Determinants of Health (SDOH) Interventions    Readmission Risk Interventions No flowsheet data found.

## 2021-10-15 ENCOUNTER — Inpatient Hospital Stay (HOSPITAL_COMMUNITY): Payer: BC Managed Care – PPO

## 2021-10-15 DIAGNOSIS — N1832 Chronic kidney disease, stage 3b: Secondary | ICD-10-CM | POA: Diagnosis not present

## 2021-10-15 DIAGNOSIS — I639 Cerebral infarction, unspecified: Secondary | ICD-10-CM | POA: Diagnosis not present

## 2021-10-15 DIAGNOSIS — I5023 Acute on chronic systolic (congestive) heart failure: Secondary | ICD-10-CM | POA: Diagnosis not present

## 2021-10-15 DIAGNOSIS — N179 Acute kidney failure, unspecified: Secondary | ICD-10-CM | POA: Diagnosis not present

## 2021-10-15 LAB — BASIC METABOLIC PANEL
Anion gap: 10 (ref 5–15)
Anion gap: 8 (ref 5–15)
BUN: 43 mg/dL — ABNORMAL HIGH (ref 6–20)
BUN: 40 mg/dL — ABNORMAL HIGH (ref 6–20)
CO2: 26 mmol/L (ref 22–32)
CO2: 27 mmol/L (ref 22–32)
Calcium: 8.3 mg/dL — ABNORMAL LOW (ref 8.9–10.3)
Calcium: 8.2 mg/dL — ABNORMAL LOW (ref 8.9–10.3)
Chloride: 103 mmol/L (ref 98–111)
Chloride: 104 mmol/L (ref 98–111)
Creatinine, Ser: 2.03 mg/dL — ABNORMAL HIGH (ref 0.61–1.24)
Creatinine, Ser: 1.97 mg/dL — ABNORMAL HIGH (ref 0.61–1.24)
GFR, Estimated: 38 mL/min — ABNORMAL LOW (ref >60)
GFR, Estimated: 39 mL/min — ABNORMAL LOW (ref >60)
Glucose, Bld: 122 mg/dL — ABNORMAL HIGH (ref 70–99)
Glucose, Bld: 110 mg/dL — ABNORMAL HIGH (ref 70–99)
Potassium: 3.7 mmol/L (ref 3.5–5.1)
Potassium: 3.9 mmol/L (ref 3.5–5.1)
Sodium: 139 mmol/L (ref 135–145)
Sodium: 139 mmol/L (ref 135–145)

## 2021-10-15 LAB — COOXEMETRY PANEL
Carboxyhemoglobin: 1.2 % (ref 0.5–1.5)
Methemoglobin: 0.8 % (ref 0.0–1.5)
O2 Saturation: 52.2 %
Total hemoglobin: 14.1 g/dL (ref 12.0–16.0)

## 2021-10-15 LAB — BETA-2-GLYCOPROTEIN I ABS, IGG/M/A
Beta-2 Glyco I IgG: <9 GPI IgG units (ref 0–20)
Beta-2-Glycoprotein I IgA: <9 GPI IgA units (ref 0–25)
Beta-2-Glycoprotein I IgM: <9 GPI IgM units (ref 0–32)

## 2021-10-15 LAB — CBC
HCT: 41.3 % (ref 39.0–52.0)
Hemoglobin: 13.6 g/dL (ref 13.0–17.0)
MCH: 31.4 pg (ref 26.0–34.0)
MCHC: 32.9 g/dL (ref 30.0–36.0)
MCV: 95.4 fL (ref 80.0–100.0)
Platelets: 141 10*3/uL — ABNORMAL LOW (ref 150–400)
RBC: 4.33 MIL/uL (ref 4.22–5.81)
RDW: 14.9 % (ref 11.5–15.5)
WBC: 8.5 10*3/uL (ref 4.0–10.5)
nRBC: 0 % (ref 0.0–0.2)

## 2021-10-15 LAB — MAGNESIUM: Magnesium: 1.9 mg/dL (ref 1.7–2.4)

## 2021-10-15 LAB — GLUCOSE, CAPILLARY: Glucose-Capillary: 134 mg/dL — ABNORMAL HIGH (ref 70–99)

## 2021-10-15 LAB — HEPARIN LEVEL (UNFRACTIONATED): Heparin Unfractionated: 0.31 IU/mL (ref 0.30–0.70)

## 2021-10-15 MED ORDER — SODIUM CHLORIDE 0.9 % IV SOLN: INTRAVENOUS | Status: DC

## 2021-10-15 MED ORDER — POTASSIUM CHLORIDE CRYS ER 20 MEQ PO TBCR
40.0000 meq | EXTENDED_RELEASE_TABLET | Freq: Once | ORAL | Status: AC
  Administered 2021-10-15: 40 meq via ORAL
  Filled 2021-10-15: qty 2

## 2021-10-15 MED ORDER — GUAIFENESIN-DM 100-10 MG/5ML PO SYRP: 5.0000 mL | ORAL_SOLUTION | ORAL | Status: DC | PRN

## 2021-10-15 MED ORDER — HYDRALAZINE HCL 25 MG PO TABS
25.0000 mg | ORAL_TABLET | Freq: Three times a day (TID) | ORAL | Status: DC
  Administered 2021-10-15 – 2021-10-16 (×3): 25 mg via ORAL
  Filled 2021-10-15 (×3): qty 1

## 2021-10-15 NOTE — Progress Notes (Addendum)
STROKE TEAM PROGRESS NOTE   INTERVAL HISTORY Patient is seen in his room with no family at the bedside.  He has been hemodynamically stable overnight and his neurological exam is stable.  TCD bubble study unable to be done yesterday- will try again today with better IV access. Vitals:   10/15/21 0314 10/15/21 0422 10/15/21 0901 10/15/21 1143  BP: (!) 147/106  (!) 170/117 (!) 171/119  Pulse: 94  98 100  Resp: 20     Temp: 97.7 F (36.5 C)  (!) 97.2 F (36.2 C) (!) 97.5 F (36.4 C)  TempSrc: Oral  Oral Oral  SpO2: 97%  97% 94%  Weight:  66.2 kg    Height:       CBC:  Recent Labs  Lab 10/14/21 0234 10/15/21 0323  WBC 8.9 8.5  HGB 14.3 13.6  HCT 43.4 41.3  MCV 97.3 95.4  PLT 127* 141*    Basic Metabolic Panel:  Recent Labs  Lab 10/14/21 0234 10/15/21 0323  NA 137 139  K 4.4 3.7  CL 105 103  CO2 21* 26  GLUCOSE 101* 122*  BUN 41* 43*  CREATININE 2.22* 2.03*  CALCIUM 8.2* 8.3*    Lipid Panel:  Recent Labs  Lab 10/12/21 0117  CHOL 167  TRIG 40  HDL 45  CHOLHDL 3.7  VLDL 8  LDLCALC 114*    HgbA1c:  Recent Labs  Lab 10/11/21 2256  HGBA1C 5.2    Urine Drug Screen:  Recent Labs  Lab 10/11/21 1358  LABOPIA NONE DETECTED  COCAINSCRNUR NONE DETECTED  LABBENZ NONE DETECTED  AMPHETMU NONE DETECTED  THCU NONE DETECTED  LABBARB NONE DETECTED     Alcohol Level  Recent Labs  Lab 10/11/21 1330  ETH <10     IMAGING past 24 hours US RENAL  Result Date: 10/14/2021 CLINICAL DATA:  Acute renal injury EXAM: RENAL / URINARY TRACT ULTRASOUND COMPLETE COMPARISON:  None. FINDINGS: Right Kidney: Renal measurements: 9.3 x 4.0 x 5.5 cm. = volume: 105 mL. Diffuse increased echogenicity is noted. No mass lesion or hydronephrosis is noted. Left Kidney: Renal measurements: 12.0 x 5.4 x 4.3 cm. = volume: 146 mL. 5 cm upper pole simple cyst is noted. No mass lesion or hydronephrosis is noted. Diffuse increased echogenicity is seen. Bladder: Decompressed Other: None.  IMPRESSION: Large left renal cyst. Increased echogenicity bilaterally consistent with medical renal disease. Electronically Signed   By: Inez Catalina M.D.   On: 10/14/2021 19:22   [place] EKG SITE RITE  Result Date: 10/14/2021 If Site Rite image not attached, placement could not be confirmed due to current cardiac rhythm.   PHYSICAL EXAM  Physical Exam  Constitutional: Ill appearing, frail middle-aged [place] American male.  Cardiovascular: Tachycardic and regular rhythm.  Respiratory: Labored, tachypneic and short of breath but improved today    NEURO:  Mental Status: AA&Ox3  Speech/Language: speech is without dysarthria or aphasia.    Cranial Nerves:  II: PERRL.  III, IV, VI: Restricted upward gaze, bilateral ptosis V: Sensation is intact to light touch and symmetrical to face.  VII: Slight right facial droop VIII: hearing intact to voice. IX, X: Phonation is normal.  XII: tongue is midline without fasciculations. Motor: 5/5 strength to LUE, LLE and RLE, 4/5 strength in RUE Tone: is normal and bulk is normal Sensation- Intact to light touch bilaterally.   Gait- deferred    ASSESSMENT/PLAN Mr. Shaye Chustz is a 56 y.o. male with history of CHF, ascending thoracic aortic dilation, HTN, noncompliance  with medications, CKD III presenting with AMS. He states that he woke up feeling tired. Went to work, and shortly after abruptly started to feel lightheaded and before he knew it, he passed out. Does not remember falling down, but woke up on ground. Unsure how long he was down, possibly 10-20 minutes. No witnesses of the the event. Someone found him down on the bathroom and called EMS. 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 He is currently on a heparin drip. MRI head shows scattered infarcts involving the right cerebellar hemisphere, left cerebral peduncle, left hippocampus, left thalamus, right occipital lobe, and left pre  and postcentral gyri, some of which appear acute while others appear more subacute in chronicity. He will need a TEE and cardiac monitor (30 day or loop), and possible hypercoag panel and cardiology consult. TCD bubble study to be performed today and TEE scheduled for Wednesday.  Stroke:  Scattered infarcts involving multiple territories likely secondary embolic cryptogenic source.  Concurrent pulmonary emboli raise possibility of hypercoagulability or paradoxical embolism Code Stroke- No acute abnormality. Small vessel disease.  CTA head & neck No large vessel occlusion, hemodynamically significant stenosis, or evidence of dissection. 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  CXR- Cardiomegaly. There is pulmonary vascular congestion and pulmonary edema in both lungs. Possibility of underlying pneumonia is not excluded MRI- Scattered infarcts involving the right cerebellar hemisphere, left cerebral peduncle, left hippocampus, left thalamus, right occipital lobe, and left pre and postcentral gyri, some of which appear acute while others appear more subacute in chronicity. CTA Chest- Mild nonocclusive pulmonary emboli within the right middle lobe pulmonary artery and right lower lobe medial segment pulmonary artery. Marked cardiomegaly, possibly mildly worsened from 08/26/2019. Small right and trace left pleural effusions. Scattered ground-glass opacities compatible with pulmonary edema, subsegmental atelectasis, and/or a mild infectious/inflammatory process 2D Echo - EF 15-20%, severely decrease LV function BLE Venous duplex- No evidence of DVT in lower extremities LDL 114 HgbA1c 5.2 VTE prophylaxis - Heparin IV    Diet   DIET DYS 3 Room service appropriate? Yes; Fluid consistency: Thin   No antithrombotic prior to admission, now on heparin IV.  Therapy recommendations:  CIR Disposition:  Pending  Pulmonary Embolism Heparin IV protocol Management by primary  team  AoC HFrEF exacerbation Home meds: Imdur, digoxin, torsemide Recent titration of torsemide r/t increasing BNP and sodium intake Echo pending  Hypertension Home meds:  hydralazine 100mg , carvedilol, clonidine 0.3 patch, cozaar Unstable- non compliant with home medication for approximately 3 months Permissive hypertension (OK if < 220/120) but gradually normalize in 5-7 days Long-term BP goal normotensive  Hyperlipidemia Home meds:  None LDL 114, goal < 70 Add Crestor  High intensity statin not indicated  Continue statin at discharge  Other Stroke Risk Factors Coronary artery disease Congestive heart failure Seen by [facility] Consider cardiology consult   Chronic Kidney Disease- follows with [facility] nephrology Dr. Olivia Mackie Monitor I&O Creatinine baseline 1.7-2.0 Cr elevated 1.8-> 2.08 -> 2.03  Variably positive ANA  Antibody testing negative. His initial ANA was negative, retest positive, antibody screening reassuring Done outpatient at [facility] day # 3  Patient seen and examined by NP/APP with MD. MD to update note as needed.   [employer] , MSN, AGACNP-BC [facility] See Amion for schedule and pager information 10/15/2021 11:59 AM  I have  personally obtained history,examined this patient, reviewed notes, independently viewed imaging studies, participated in medical decision making and plan of care.ROS completed by me personally and pertinent positives fully documented  I have made any additions or clarifications directly to the above note. Agree with note above.  Plan check TCD bubble study today for right-to-left shunt.  Follow hypercoagulable panel labs.  TEE scheduled for tomorrow.  Long discussion with the patient and his wife at the bedside and answered questions.  Discussed with Dr. Shirlee Limerick.  Greater than 50% time during this 35-minute visit was spent on counseling and coordination of care and  discussion with patient and care team and family and answering questions.  Antony Contras, MD Medical Director [place] Pager: [phone] 10/15/2021 2:21 PM     To contact Stroke Continuity provider, please refer to [email or web address] After hours, contact General Neurology

## 2021-10-15 NOTE — Progress Notes (Signed)
° ° °  [facility] has been requested to perform a transesophageal echocardiogram on 02/01 for stroke.  After careful review of history and examination, the risks and benefits of transesophageal echocardiogram have been explained including risks of esophageal damage, perforation (1:10,000 risk), bleeding, pharyngeal hematoma as well as other potential complications associated with conscious sedation including aspiration, arrhythmia, respiratory failure and death. Alternatives to treatment were discussed, questions were answered. Patient and his wife are willing to proceed.   Rosaria Ferries, PA-C 10/15/2021 1:27 PM

## 2021-10-15 NOTE — Progress Notes (Signed)
°   10/15/21 1240  Clinical Encounter Type  Visited With Patient and family together;Health care provider  Visit Type Initial;Other (Comment) (Request for Advance Care Directive Education and Consultation.)  Referral From Nurse  Consult/Referral To Chaplain   A referral from "Oscar Rosales" was forwarded to me requesting that I meet with Oscar Rosales and his family to discuss Advance Directive and Medical Power of [place] from [employer]. Upon arrival to the nursing station I was told that an Advance Directive packet was already given to the family and was in the patient's room. Upon entering the room I met with patient's wife. She told me that she already read through the A.D. information. I then explained to the patient and his wife the procedure for completing the H.C.P.O.A  and Living Will. Oscar Rosales was not actively participating in the conversation even though the instructions were directed to both him, and to his wife.   After meeting with Oscar Rosales and his wife, I consulted with patient's primary nurse. I explained that even though I was directing the instructions for the completion of the A.D. to BOTH, Oscar Rosales and his wife, it did not appear that patient was following along with the instructions; nor did it appear that he understood what the Advance Directive was about. Specifically asking the nurse if she believed that Oscar Rosales was able to make informed legal decisions on his own behalf. The nurse was not sure if Oscar Rosales was capable of making informed decisions and stated that she would speak with the patient's physician. I advised her that until it was determined that Oscar Rosales could make those decisions and fill out the H.C.P.O.A. and Living Will on his own, that we should not proceed with the A.D. completion at this time. Chaplain [employer].

## 2021-10-15 NOTE — Progress Notes (Signed)
Inpatient Rehab Admissions Coordinator:   I am following for potential CIR admit once medically ready. Pt. Transferred to 2C overnight and work up remains ongoing. I spoke with Pt.'s wife and she confirmed that pt. Will have 24/7 support at d/c. I will open a case for insurance auth once medically ready.   Megan Salon, MS, CCC-SLP Rehab Admissions Coordinator  [phone] (celll) [phone] (office)

## 2021-10-15 NOTE — Progress Notes (Addendum)
Advanced Heart Failure Rounding Note  PCP-Cardiologist: None   Subjective:    1/30: DBA started for low output, initial Co-ox 44%   On DBA 2.5. Co-ox 52% today   1.4L in UOP yesterday. Did not get PM dose of IV Lasix. Doubt wts accurate. CVP 11-12   SCr improved, 2.08>>2.22>>2.03 K 3.7   Bubble study negative.   Continues w/ RUE weakness. Denies any current dyspnea. No CP.   Objective:   Weight Range: 66.2 kg Body mass index is 20.36 kg/m.   Vital Signs:   Temp:  [97.7 F (36.5 C)-98 F (36.7 C)] 97.7 F (36.5 C) (01/31 0314) Pulse Rate:  [70-96] 94 (01/31 0314) Resp:  [16-29] 20 (01/31 0314) BP: (124-156)/(96-117) 147/106 (01/31 0314) SpO2:  [91 %-100 %] 97 % (01/31 0314) Weight:  [66.2 kg-74.9 kg] 66.2 kg (01/31 0422) Last BM Date: 10/13/21  Weight change: Filed Weights   10/11/21 1435 10/14/21 1256 10/15/21 0422  Weight: 77.1 kg 74.9 kg 66.2 kg    Intake/Output:   Intake/Output Summary (Last 24 hours) at 10/15/2021 0715 Last data filed at 10/15/2021 0500 Gross per 24 hour  Intake 865.78 ml  Output 1350 ml  Net -484.22 ml      Physical Exam    General:  weak appearing. No resp difficulty HEENT: Normal Neck: Supple. JVP 12 cm . Carotids 2+ bilat; no bruits. No lymphadenopathy or thyromegaly appreciated. Cor: PMI nondisplaced. Regular rate & rhythm. No rubs, gallops or murmurs. Lungs: decreased BS at the bases bilaterally  Abdomen: Soft, nontender, nondistended. No hepatosplenomegaly. No bruits or masses. Good bowel sounds. Extremities: No cyanosis, clubbing, rash, 1+ b/l LE edema + TED hoses + RUE PICC  Neuro: Alert & orientedx3, + RUE weakness. Affect pleasant   Telemetry   NSR 90s. NSVT x 1 (10 beats)   EKG    No new EKG to review   Labs    CBC Recent Labs    10/14/21 0234 10/15/21 0323  WBC 8.9 8.5  HGB 14.3 13.6  HCT 43.4 41.3  MCV 97.3 95.4  PLT 127* 141*   Basic Metabolic Panel Recent Labs    16/10/96 0234  10/15/21 0323  NA 137 139  K 4.4 3.7  CL 105 103  CO2 21* 26  GLUCOSE 101* 122*  BUN 41* 43*  CREATININE 2.22* 2.03*  CALCIUM 8.2* 8.3*   Liver Function Tests No results for input(s): AST, ALT, ALKPHOS, BILITOT, PROT, ALBUMIN in the last 72 hours. No results for input(s): LIPASE, AMYLASE in the last 72 hours. Cardiac Enzymes No results for input(s): CKTOTAL, CKMB, CKMBINDEX, TROPONINI in the last 72 hours.  BNP: BNP (last 3 results) Recent Labs    10/11/21 1245  BNP 3,942.1*    ProBNP (last 3 results) No results for input(s): PROBNP in the last 8760 hours.   D-Dimer No results for input(s): DDIMER in the last 72 hours. Hemoglobin A1C No results for input(s): HGBA1C in the last 72 hours. Fasting Lipid Panel No results for input(s): CHOL, HDL, LDLCALC, TRIG, CHOLHDL, LDLDIRECT in the last 72 hours. Thyroid Function Tests No results for input(s): TSH, T4TOTAL, T3FREE, THYROIDAB in the last 72 hours.  Invalid input(s): FREET3  Other results:   Imaging    US RENAL  Result Date: 10/14/2021 CLINICAL DATA:  Acute renal injury EXAM: RENAL / URINARY TRACT ULTRASOUND COMPLETE COMPARISON:  None. FINDINGS: Right Kidney: Renal measurements: 9.3 x 4.0 x 5.5 cm. = volume: 105 mL. Diffuse increased echogenicity is  noted. No mass lesion or hydronephrosis is noted. Left Kidney: Renal measurements: 12.0 x 5.4 x 4.3 cm. = volume: 146 mL. 5 cm upper pole simple cyst is noted. No mass lesion or hydronephrosis is noted. Diffuse increased echogenicity is seen. Bladder: Decompressed Other: None. IMPRESSION: Large left renal cyst. Increased echogenicity bilaterally consistent with medical renal disease. Electronically Signed   By: Inez Catalina M.D.   On: 10/14/2021 19:22   [place] EKG SITE RITE  Result Date: 10/14/2021 If Site Rite image not attached, placement could not be confirmed due to current cardiac rhythm.    Medications:     Scheduled Medications:  Chlorhexidine Gluconate Cloth  6  each Topical Daily   dextromethorphan-guaiFENesin  1 tablet Oral BID   furosemide  80 mg Intravenous BID   rosuvastatin  20 mg Oral Daily   sodium chloride flush  10-40 mL Intracatheter Q12H    Infusions:  DOBUTamine 2.5 mcg/kg/min (10/15/21 0314)   heparin 1,200 Units/hr (10/15/21 0314)    PRN Medications: acetaminophen **OR** acetaminophen (TYLENOL) oral liquid 160 mg/5 mL **OR** acetaminophen, senna-docusate, sodium chloride flush    Patient Profile   56 y.o. male with hx chronic systolic CHF/NICM, HTN, CKD IIIb, hx hepatitis C infection, noncompliance with medical therapy. Admitted with CVA, PE and acute on chronic systolic CHF w/ low output. Has been followed at [facility].  Echo with biventricular hypertrophy/dysfunction EF 15-20% Moderate MR moderate RV dysfunction.  Assessment/Plan   Acute on chronic systolic CHF/NICM: -Diagnosed 2018. Normal coronaries on LHC in 2019. Felt to be due to uncontrolled hypertension. -EF previously recovered to 45-50%.  -EF down to 25-30% in 06/21 in setting of uncontrolled HTN.  -Echo 01/23: EF 15-20%, RV moderately reduced, severe biatrial enlargement, moderate MR, moderate TR, moderate AI -Volume overloaded w/ low output, initial co-ox 44%.  -Now on DBA 2.5, Co-ox 52% today. CVP 12, Scr improving -Continue IV Lasix 80 mg bid + TED hoses. Supp K  -No beta blocker with a/c CHF -Now off losartan with AKI on CKD -Eventual SGLT2i. A1c 5.2% -Can consider imdur/hydralazine, if ok w/ neuro  -May need RHC depending on coarse -Likely not candidate for advanced therapies currently d/t noncompliance and renal impairment.   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 Scattered infarcts on MRI brain -No large vessel occlusion on CTA head and neck -Negative bubble study on echo -Scheduled for TEE tomorrow  -Neurology following   3. PE: -Incidentally noted on CTA chest -On heparin gtt -LE dopplers negative for DVT -Hypercoagulable labs pending   4. AKI on CKD  IIIb: -Baseline Scr 1.7 -SCr peaked to 2.22. Renal US c/w medical renal disease. -suspect cardiorenal from low output HF -Improving w/ DBA and diuresis, SCr 2.0 today -Follow BMP    Length of Stay: 3  Brittainy Simmons, PA-C  10/15/2021, 7:15 AM  Advanced Heart Failure Team Pager [phone] (M-F; 7a - 5p)  Please contact [facility] for night-coverage after hours (5p -7a ) and weekends on [email or web address]  Patient seen and examined with the above-signed Advanced Practice Provider and/or Housestaff. I personally reviewed laboratory data, imaging studies and relevant notes. I independently examined the patient and formulated the important aspects of the plan. I have edited the note to reflect any of my changes or salient points. I have personally discussed the plan with the patient and/or family.  Now on DBA for low output HF. Much more alert. Diuresis improved. Scr improving.   Denies SOB, orthopnea or PND.   General:  Weak appearing. No  resp difficulty HEENT: normal Neck: supple. JVP 10-12. Carotids 2+ bilat; no bruits. No lymphadenopathy or thryomegaly appreciated. Cor: PMI nondisplaced. Regular rate & rhythm. +s3 Lungs: clear Abdomen: soft, nontender, nondistended. No hepatosplenomegaly. No bruits or masses. Good bowel sounds. Extremities: no cyanosis, clubbing, rash, 1+ edema Neuro: alert & orientedx3, cranial nerves grossly intact. moves all 4 extremities w/o difficulty. Affect pleasant  He has low output HF. Now improved with inotropic support. Remains volume overloaded. Will continue dobutamine and IV lasix. Will push back TEE until more stable. Possibly Friday or Monday.   Overall prognosis is very tenuous and will need to start thinking about whether or not he is candidate for advanced HF therapies unless EF improved with GDMT.   Glori Bickers, MD  6:14 PM

## 2021-10-15 NOTE — Progress Notes (Signed)
TCD bubble study has been completed.   Preliminary results in CV Proc.   [name] [name] 10/15/2021 3:11 PM

## 2021-10-15 NOTE — Progress Notes (Signed)
ANTICOAGULATION CONSULT NOTE - Follow Up Consult  Pharmacy Consult for Heparin Indication: pulmonary embolus and stroke  Allergies  Allergen Reactions   Lisinopril Cough    SWITCHED TO LOSARTAN    Patient Measurements: Height: 5\' 11"  (180.3 cm) Weight: 66.2 kg (145 lb 15.1 oz) IBW/kg (Calculated) : 75.3 Heparin Dosing Weight: 77.1 kg   Vital Signs: Temp: 97.7 F (36.5 C) (01/31 0314) Temp Source: Oral (01/31 0314) BP: 147/106 (01/31 0314) Pulse Rate: 94 (01/31 0314)  Labs: Recent Labs    10/13/21 0055 10/14/21 0234 10/15/21 0323  HGB 14.8 14.3 13.6  HCT 43.8 43.4 41.3  PLT 166 127* 141*  HEPARINUNFRC 0.39 0.29* 0.31  CREATININE 2.08* 2.22* 2.03*     Estimated Creatinine Clearance: 38 mL/min (A) (by C-G formula based on SCr of 2.03 mg/dL (H)).   Medications:  Scheduled:   Chlorhexidine Gluconate Cloth  6 each Topical Daily   dextromethorphan-guaiFENesin  1 tablet Oral BID   furosemide  80 mg Intravenous BID   rosuvastatin  20 mg Oral Daily   sodium chloride flush  10-40 mL Intracatheter Q12H   Infusions:   DOBUTamine 2.5 mcg/kg/min (10/15/21 0314)   heparin 1,200 Units/hr (10/15/21 0314)    Assessment: 56 yo M continues on IV heparin for PE and scattered embolic infarcts.  Heparin level now at  goal on 1200 units/hr. No bleeding noted. Hgb down slightly from 14>13 but still within normal limits. Plt count stable.   Will target lower end of heparin goal in the setting of acute infarcts as well.  Apixaban copay check ($47) done yesterday.   Goal of Therapy:  Heparin level 0.3-0.5 units/ml Monitor platelets by anticoagulation protocol: Yes   Plan:  Continue heparin at 1200 units/hr Heparin level and CBC with AM labs. Follow-up plans for oral anticoagulation. Monitor for s/sx bleeding.  Erin Hearing PharmD., BCPS Clinical Pharmacist 10/15/2021 7:39 AM

## 2021-10-15 NOTE — PMR Pre-admission (Shared)
PMR Admission Coordinator Pre-Admission Assessment  Patient: Oscar Rosales is an 56 y.o., male MRN: [id number] DOB: 1966-08-11 Height: _0  (180.3 cm) Weight: 66.2 kg  Insurance Information HMO: ***    PPO: ***     PCP: ***     IPA: ***     80/20: ***     OTHER: *** PRIMARY: [place] PPO  Policy#: [id number]      Subscriber: *** CM Name: ***      Phone#: ***     Fax#: *** Pre-Cert#: ***      Employer: *** Benefits:  Phone #: ***     Name: *** Eff. Date: ***     Deduct: ***      Out of Pocket Max: ***      Life Max: *** CIR: ***      SNF: *** Outpatient: ***     Co-Pay: *** Home Health: ***      Co-Pay: *** DME: ***     Co-Pay: *** Providers: *** SECONDARY: ***      Policy#: ***     Phone#: ***  Financial Counselor: ***      Phone#: ***  The Data Collection Information Summary for patients in Inpatient Rehabilitation Facilities with attached Privacy Act [place] Records was provided and verbally reviewed with: {CHL IP Patient Family VO:[id number]}  Emergency Contact Information Contact Information     Name Relation Home Work Mobile   [place] Spouse [phone]     Overacker,Kayden Daughter   [phone]       Current Medical History  Patient Admitting Diagnosis: CVA History of Present Illness: Oscar Rosales is a 56 year old male with a past medical history of heart failure with reduced ejection fraction (EF 25 to 30%), hyperlipidemia, hypertension, CAD and CKD stage IIIb who presented to the ED 10/11/21 with syncope. Patient stated that yesterday he was feeling tired and went to work.  At work, he started feeling lightheaded and passed out.  He does not member hitting the ground but did wake up on the ground.  Was uncertain how long he was unconscious before he woke up.  He was found down in the bathroom and EMS was called.  Patient states that he had some word finding difficulty, right upper extremity weakness as well as mild confusion when he was admitted.   He admits to medication noncompliance over the last 3 months.  Code Stroke- No acute abnormality. Small vessel disease. CTA head & neck showed no large vessel occlusion, hemodynamically significant stenosis, or evidence of dissection. 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 CXR revealed Cardiomegaly. There is pulmonary vascular congestion and pulmonary edema in both lungs.  MRI showed scattered infarcts involving the right cerebellar hemisphere, left cerebral peduncle, left hippocampus, left thalamus, right occipital lobe, and left pre and postcentral gyri, some of which appear acute while others appear more subacute in chronicity. CTA Chest- Mild nonocclusive pulmonary emboli within the right middle lobe pulmonary artery and right lower lobe medial segment pulmonary artery. Marked cardiomegaly, possibly mildly worsened from 08/26/2019. Small right and trace left pleural effusions. Scattered ground-glass opacities compatible with pulmonary edema, subsegmental atelectasis, and/or a mild infectious/inflammatory process. 2D Echo - EF 15-20%, severely decrease LV function. BLE Venous duplex- No evidence of DVT in lower extremities. On 10/15/21 patient put out 1.3 L of urine output last night, concerning for acute on chronic HFrEF exacerbation. Pt. Transferred to ICU and puut on  Lasix 80 mg twice daily, dobutamine  per cardiology. CIR was consulted to assist return to PLOF.  Complete NIHSS TOTAL: 4  Patient's medical record from [facility] has been reviewed by the rehabilitation admission coordinator and physician.  Past Medical History  Past Medical History:  Diagnosis Date   CHF (congestive heart failure) ([place])    Hypertension    Renal disorder     Has the patient had major surgery during 100 days prior to admission? No  Family History   family history is not on file.  Current Medications  Current Facility-Administered Medications:     acetaminophen (TYLENOL) tablet 650 mg, 650 mg, Oral, Q4H PRN, 650 mg at 10/14/21 0839 **OR** acetaminophen (TYLENOL) 160 MG/5ML solution 650 mg, 650 mg, Per Tube, Q4H PRN **OR** acetaminophen (TYLENOL) suppository 650 mg, 650 mg, Rectal, Q4H PRN, Joette Catching, PA-C   Chlorhexidine Gluconate Cloth 2 % PADS 6 each, 6 each, Topical, Daily, Joette Catching, PA-C, 6 each at 10/14/21 1719   dextromethorphan-guaiFENesin ([place] DM) 30-600 MG per 12 hr tablet 1 tablet, 1 tablet, Oral, BID, Joette Catching, PA-C, 1 tablet at 10/14/21 2056   DOBUTamine (DOBUTREX) infusion 4000 mcg/mL, 2.5 mcg/kg/min, Intravenous, Continuous, Bensimhon, Shaune Pascal, MD, Last Rate: 2.81 mL/hr at 10/15/21 0314, 2.5 mcg/kg/min at 10/15/21 0314   furosemide (LASIX) injection 80 mg, 80 mg, Intravenous, BID, Joette Catching, PA-C, 80 mg at 10/14/21 4128   heparin ADULT infusion 100 units/mL (25000 units/236m), 1,200 Units/hr, Intravenous, Continuous, FJoette Catching [place] Last Rate: 12 mL/hr at 10/15/21 0314, 1,200 Units/hr at 10/15/21 0314   potassium chloride SA (KLOR-CON M) CR tablet 40 mEq, 40 mEq, Oral, Once, WLyndee Leo RPH   rosuvastatin (CRESTOR) tablet 20 mg, 20 mg, Oral, Daily, FJoette Catching PA-C, 20 mg at 10/14/21 07867  senna-docusate (Senokot-S) tablet 1 tablet, 1 tablet, Oral, QHS PRN, FJoette Catching PA-C   sodium chloride flush (NS) 0.9 % injection 10-40 mL, 10-40 mL, Intracatheter, Q12H, FJoette Catching PA-C, 10 mL at 10/14/21 2056   sodium chloride flush (NS) 0.9 % injection 10-40 mL, 10-40 mL, Intracatheter, PRN, FJoette Catching PA-C  Patients Current Diet:  Diet Order             DIET DYS 3 Room service appropriate? Yes; Fluid consistency: Thin  Diet effective now                   Precautions / Restrictions Precautions Precautions: Fall Restrictions Weight Bearing Restrictions: No   Has the patient had 2 or more falls or a fall  with injury in the past year? No  Prior Activity Level Community (5-7x/wk): Pt. was active in the community PTA  Prior Functional Level Self Care: Did the patient need help bathing, dressing, using the toilet or eating? Independent  Indoor Mobility: Did the patient need assistance with walking from room to room (with or without device)? Independent  Stairs: Did the patient need assistance with internal or external stairs (with or without device)? Independent  Functional Cognition: Did the patient need help planning regular tasks such as shopping or remembering to take medications? Independent  Patient Information Are you of Hispanic, Latino/a,or Spanish origin?: A. No, not of Hispanic, Latino/a, or Spanish origin What is your race?: B. Black or African American Do you need or want an interpreter to communicate with a doctor or health care staff?: 0. No  Patient's Response To:  Health Literacy and Transportation Is the patient able to respond to  health literacy and transportation needs?: Yes Health Literacy - How often do you need to have someone help you when you read instructions, pamphlets, or other written material from your doctor or pharmacy?: Never In the past 12 months, has lack of transportation kept you from medical appointments or from getting medications?: No In the past 12 months, has lack of transportation kept you from meetings, work, or from getting things needed for daily living?: No  Home Assistive Devices / Equipment Home Equipment: None  Prior Device Use: Indicate devices/aids used by the patient prior to current illness, exacerbation or injury? None of the above  Current Functional Level Cognition  Arousal/Alertness: Awake/alert Overall Cognitive Status: Impaired/Different from baseline Current Attention Level: Selective Orientation Level: Oriented to person, Oriented to place, Oriented to situation, Disoriented to time Following Commands: Follows one step  commands consistently Safety/Judgement: Decreased awareness of safety, Decreased awareness of deficits General Comments: Pt oriented to self, place and year this date. He followed all one step commands well, some moments of confusion throughout. After discussion of IPR, pt stated "is the pressure good, water pressure?" Pt has limited insight to deficits and safety. Attention: Sustained Sustained Attention: Impaired Sustained Attention Impairment: Verbal basic Memory: Impaired Memory Impairment: Storage deficit, Retrieval deficit, Decreased recall of new information Awareness: Impaired Awareness Impairment: Intellectual impairment Problem Solving: Impaired Problem Solving Impairment: Verbal complex Safety/Judgment: Impaired    Extremity Assessment (includes Sensation/Coordination)  Upper Extremity Assessment: RUE deficits/detail RUE Deficits / Details: impaired ROM, able to move actively against gravity. globally 3-5. imapired finger to nose coordination. slow and deliberate thumb to finger. RUE Coordination: decreased fine motor, decreased gross motor  Lower Extremity Assessment: Defer to PT evaluation    ADLs  Overall ADL's : Needs assistance/impaired Eating/Feeding: Minimal assistance, Sitting Eating/Feeding Details (indicate cue type and reason): dys 3, thin Grooming: Minimal assistance, Sitting Upper Body Bathing: Moderate assistance, Sitting Lower Body Bathing: Moderate assistance, Sit to/from stand Upper Body Dressing : Minimal assistance, Sitting Lower Body Dressing: Moderate assistance, Sit to/from stand Toilet Transfer: Moderate assistance, Ambulation Toileting- Clothing Manipulation and Hygiene: Moderate assistance, Sit to/from stand Functional mobility during ADLs: Moderate assistance, Cueing for safety General ADL Comments: pt required cues fro all sequencing and safety. overall limited by impaire cognition, balance, coordination and weakness. Pt with vision changes as  well    Mobility  Overal bed mobility: Needs Assistance Bed Mobility: Rolling, Supine to Sit, Sit to Supine Rolling: Min assist Supine to sit: Min assist Sit to supine: Min assist General bed mobility comments: min A for verbal cues, coordination, balance and safety    Transfers  Overall transfer level: Needs assistance Equipment used: 1 person hand held assist Transfers: Sit to/from Stand Sit to Stand: Min assist General transfer comment: min A for standing at the bed side, once standing pt was close min G. min A for side steps at the bed side due to impaired coordination and balance. No RW in the room this date, [facility] assist.    Ambulation / Gait / Stairs / Office manager / Balance Dynamic Sitting Balance Sitting balance - Comments: initally min A to steady in sitting with report of some dizziness. Ultimately min G-supervision for sitting ~10 minutes Balance Overall balance assessment: Needs assistance Sitting-balance support: Feet supported Sitting balance-Leahy Scale: Fair Sitting balance - Comments: initally min A to steady in sitting with report of some dizziness. Ultimately min G-supervision for sitting ~10 minutes Standing balance support: Bilateral upper  extremity supported Standing balance-Leahy Scale: Poor    Special needs/care consideration Special service needs none   Previous Home Environment (from acute therapy documentation) Living Arrangements: Other relatives  Lives With: Spouse, Son (2 boys at home, 19 and 41) Available Help at Discharge: Family Type of Home: House Home Layout: One level Home Access: Stairs to enter Technical brewer of Steps: 3 Bathroom Shower/Tub: Chiropodist: Standard Bathroom Accessibility: Yes How Accessible: Accessible via wheelchair, Accessible via [place]: No  Discharge Living Setting Plans for Discharge Living Setting: Patient's home Type of Home at Discharge:  [place]: One level Discharge Home Access: Stairs to enter Entrance Stairs-Rails: None Entrance Stairs-Number of Steps: 3 Discharge Bathroom Shower/Tub: [place] unit Discharge Bathroom Toilet: Standard Discharge Bathroom Accessibility: Yes How Accessible: Accessible via wheelchair, Accessible via walker Does the patient have any problems obtaining your medications?: No  Social/Family/Support Systems Patient Roles: Spouse Contact Information: [phone] Anticipated Caregiver: Nehemyah Foushee Anticipated Caregiver's Contact Information: [phone] Ability/Limitations of Caregiver: Can provide Supervision-min A Caregiver Availability: 24/7 Discharge Plan Discussed with Primary Caregiver: Yes Is Caregiver In Agreement with Plan?: Yes Does Caregiver/Family have Issues with Lodging/Transportation while Pt is in Rehab?: No  Goals Patient/Family Goal for Rehab: PT/OT/SLP Supervision-mod I Expected length of stay: 7-10 days Pt/Family Agrees to Admission and willing to participate: Yes Program Orientation Provided & Reviewed with Pt/Caregiver Including Roles  & Responsibilities: Yes  Decrease burden of Care through IP rehab admission: Specialzed equipment needs, Decrease number of caregivers, and Patient/family education  Possible need for SNF placement upon discharge: not anticipated   Patient Condition: I have reviewed medical records from [facility] , spoken with CM, and patient. I met with patient at the bedside for inpatient rehabilitation assessment.  Patient will benefit from ongoing PT, OT, and SLP, can actively participate in 3 hours of therapy a day 5 days of the week, and can make measurable gains during the admission.  Patient will also benefit from the coordinated team approach during an Inpatient Acute Rehabilitation admission.  The patient will receive intensive therapy as well as Rehabilitation physician, nursing, social worker, and  care management interventions.  Due to safety, skin/wound care, disease management, medication administration, pain management, and patient education the patient requires 24 hour a day rehabilitation nursing.  The patient is currently *** with mobility and basic ADLs.  Discharge setting and therapy post discharge at home with home health is anticipated.  Patient has agreed to participate in the Acute Inpatient Rehabilitation Program and will admit {Time; today/tomorrow:10263}.  Preadmission Screen Completed By:  Genella Mech, 10/15/2021 8:32 AM ______________________________________________________________________   Discussed status with Dr. Marland Kitchen on *** at *** and received approval for admission today.  Admission Coordinator:  Genella Mech, CCC-SLP, time Marland KitchenSudie Grumbling ***   Assessment/Plan: Diagnosis: Does the need for close, 24 hr/day Medical supervision in concert with the patient's rehab needs make it unreasonable for this patient to be served in a less intensive setting? {yes_no_potentially:3041433} Co-Morbidities requiring supervision/potential complications: *** Due to {due OF:7510258}, does the patient require 24 hr/day rehab nursing? {yes_no_potentially:3041433} Does the patient require coordinated care of a physician, rehab nurse, PT, OT, and SLP to address physical and functional deficits in the context of the above medical diagnosis(es)? {yes_no_potentially:3041433} Addressing deficits in the following areas: {deficits:3041436} Can the patient actively participate in an intensive therapy program of at least 3 hrs of therapy 5 days a week? {yes_no_potentially:3041433} The potential for patient to make measurable  gains while on inpatient rehab is {potential:3041437} Anticipated functional outcomes upon discharge from inpatient rehab: {functional outcomes:304600100} PT, {functional outcomes:304600100} OT, {functional outcomes:304600100} SLP Estimated rehab length of stay to reach the above  functional goals is: *** Anticipated discharge destination: {anticipated dc setting:21604} 10. Overall Rehab/Functional Prognosis: {potential:3041437}   MD Signature: ***

## 2021-10-15 NOTE — Progress Notes (Addendum)
Subjective: No acute overnight events.   Patient was seen at bedside during rounds today. Pt reports feeling "good". He has noticed improvement in leg weakness while working with PT and some improvement in right arm weakness. He has no complaints at this time.   Pt is updated on the plan for today, and all questions and concerns are addressed.   Objective:  Vital signs in last 24 hours: Vitals:   10/15/21 2145 10/15/21 2350 10/16/21 0323 10/16/21 0650  BP: (!) 146/100 (!) 154/126 (!) 170/119 (!) 162/104  Pulse: 96 100 100   Resp: 15 20 20    Temp:  97.6 F (36.4 C) 97.8 F (36.6 C)   TempSrc:  Oral Oral   SpO2: 90% 92% 95%   Weight:   64.8 kg   Height:       Constitutional: alert, ill-appearing, in NAD HENT: normocephalic, atraumatic, mucous membranes moist Eyes: Dilated pupil on the left side. Intranuclear ophthalmoplegia noted. Cardiovascular: RRR, no m/r/g, 1+ bilateral LE edema  Pulmonary/Chest: normal work of breathing on room air, LCTAB MSK: normal bulk and tone Neurological: A&O to person placed and situation, not to time.   Tongue slightly deviates to the right. Bilateral ptosis is present.  4/5 strength of right upper and lower extremity 5/5 strength of left upper and lower extremities Lack of coordination of fine motor movements of the right hand   Assessment/Plan:  Principal Problem:   Acute CVA (cerebrovascular accident) ([place]) Active Problems:   Hypertension, uncontrolled   Non-ischemic cardiomyopathy ([place])   Stage 3 chronic kidney disease ([place])   Acute pulmonary embolism (HCC)  Oscar Rosales is a 56 y.o. male with a pertinent PMH of nonischemic cardiomyopathy, HFrEF (EF 25-30% 02/2020), HLD, HTN, and CKD3b admitted for acute ischemic stroke 2/2 embolic infarctions, mild nonocclusive pulmonary emboli, and acute on chronic HFrEF exacerbation.    Multifocal ischemic brain infarcts Suspecting embolic stroke Neuro exam is overall unchanged from yesterday  however patient is able to converse with [place] in full sentences today. Stroke workup negative thus far, including TCD bubble study. Plan for TEE once clinically stable, and cardiac monitor after discharge. High risk for hemorrhagic conversion. BP elevated; overall blood pressure goal of [id number] systolic.  - Neurology consulted, follow up on recommendations  - Increase hydralazine from 25 mg tid -> 37.5 tid by Cards - Off of dobutamine, and switched to milrinone 0.25 mcg d/t HTN - Heparin infusion. Okay to switch to DOAC when finished with procedures, discuss with cardiology - LDL of 114, continue Crestor 20 mg - Telemetry - Frequent neuro checks  - PT/OT/SLP - Approved for CIR, pending medical stabilization    Mild nonocclusive pulmonary emboli  Unclear source of emboli. No known risk factors. IgM cardiolipin antibody is borderline positive, however other Ig cardiolipin antibodies are negative. Otherwise, hypercoag labs are pending. TTE showed EF of 15-20% with severely decreased left ventricle function, TCD bubble study neg. LE dopplers negative for DVT. Plan for TEE next week depending on clinical status.   - TEE per cards  - Continue on heparin infusion, will likely need DOAC loading dose. - Follow-up on remaining hypercoagulable studies    AoC HFrEF exacerbation Nonischemic hypertensive cardiomyopathy Uncontrolled Hypertension   Diuresing slowly. Put out 700 cc, with net 3.3L; weight down from 77.1 kg on admission to 64.8 kg. Great improvement in LE edema. Started on milrinone 0.25 mcg, and off of dobutamine. Per HF team, currently not candidate for advanced therapies given comorbidities.  - Continue  IV Lasix 80 mg bid - Milrinone 0.25 mcg  - Daily weights and I/O's - Cardiac monitoring  - Up-titrated hydral; hold off on restarting beta blocker and ARB for now--discuss restarting ARB when kidney function near baseline, beta blocker when HF stabilized   AKI on CKD3b Baseline Cr 1.6-1.8.  Cr stable at 2 for past 2 days. Likely cardiorenal from low output HF. - Continue IV Lasix as above - Trend BMP - Avoid nephrotoxins    Stable ascending thoracic aortic dilatation  4.2 cm in size. - Annual imaging follow-up recommended by CTA or MRA     Best Practice: Diet: Dysphagia 3  IVF: None, None VTE: Heparin Code: Full PT/OT: CIR   [name], MD  Internal Medicine Resident, PGY-1 Pager: [phone] After 5pm on weekdays and 1pm on weekends: On Call pager [phone]

## 2021-10-15 NOTE — Progress Notes (Signed)
Physical Therapy Treatment Patient Details Name: Oscar Rosales MRN: [id number] DOB: 23-Oct-1965 Today's Date: 10/15/2021   History of Present Illness Pt is a 56 y/o male admitted secondary to fatigue and syncopal episode at work. 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 Pt also found to have mild nonocclusive pulmonary emboli and started on Heparin drip. PMH including but not limited to HFrEF (EF 25-30% 02/2020), HLD, HTN, CAD, and CKD3b.    PT Comments    Pt making excellent progress towards his physical therapy goals; motivated to participate. Pt requiring minimal assist to stand. Ambulating 30 ft x 2 with a walker and close chair follow. Demonstrates left lateral lean, right sided weakness, cognitive impairment, decreased activity tolerance, impaired standing balance, and gait abnormalities. BP 170/126 (139), HR 88-115 bpm, SpO2 99% on 3L O2.  Encouraged continued elevation of RUE and AROM to help with edema. Based on PLOF, motivation and family support, suspect continued gains. Recommend AIR to address deficits, maximize functional mobility and decrease caregiver burden.    Recommendations for follow up therapy are one component of a multi-disciplinary discharge planning process, led by the attending physician.  Recommendations may be updated based on patient status, additional functional criteria and insurance authorization.  Follow Up Recommendations  Acute inpatient rehab (3hours/day)     Assistance Recommended at Discharge Frequent or constant Supervision/Assistance  Patient can return home with the following A lot of help with walking and/or transfers;A lot of help with bathing/dressing/bathroom;Assistance with cooking/housework;Direct supervision/assist for  medications management;Assist for transportation;Help with stairs or ramp for entrance   Equipment Recommendations  Other (comment) (defer)    Recommendations for Other Services Rehab consult     Precautions / Restrictions Precautions Precautions: Fall Restrictions Weight Bearing Restrictions: No     Mobility  Bed Mobility Overal bed mobility: Needs Assistance Bed Mobility: Supine to Sit Rolling: Supervision         General bed mobility comments: Increased time, cueing for initiation, no physical assist rqeuired    Transfers Overall transfer level: Needs assistance Equipment used: Rolling walker (2 wheels) Transfers: Sit to/from Stand Sit to Stand: Min assist           General transfer comment: MinA to rise and steady from edge of bed    Ambulation/Gait Ambulation/Gait assistance: Mod assist, +2 safety/equipment Gait Distance (Feet): 60 Feet (30 ft x 2) Assistive device: Rolling walker (2 wheels) Gait Pattern/deviations: Step-through pattern, Decreased stride length, Decreased dorsiflexion - right, Decreased step length - right Gait velocity: decreased Gait velocity interpretation: <1.8 ft/sec, indicate of risk for recurrent falls   General Gait Details: Pt with consistent left lateral lean, able to correct minimally with cueing but unable to maintain. cues for upward gaze and activity pacing. pt ambulating 30 ft x 2 with close chair follow, modA for balance. able to clear R foot, but decreased step length with fatigue   Stairs             Wheelchair Mobility    Modified Rankin (Stroke Patients Only)       Balance Overall balance assessment: Needs assistance Sitting-balance support: Feet supported Sitting balance-Leahy Scale: Fair     Standing balance support: Bilateral upper extremity supported Standing balance-Leahy Scale: Poor  Cognition Arousal/Alertness: Awake/alert Behavior During Therapy: Flat  affect, WFL for tasks assessed/performed Overall Cognitive Status: Impaired/Different from baseline Area of Impairment: Orientation, Attention, Memory, Following commands, Safety/judgement, Awareness, Problem solving                 Orientation Level: Disoriented to, Time, Situation Current Attention Level: Selective Memory: Decreased short-term memory Following Commands: Follows one step commands consistently Safety/Judgement: Decreased awareness of safety, Decreased awareness of deficits Awareness: Emergent Problem Solving: Slow processing, Requires verbal cues General Comments: Pt not oriented to time, cues for use of calendar in room to re-orient. Pt following 1 step commands, has difficulty with multi step commands.        Exercises      General Comments        Pertinent Vitals/Pain Pain Assessment Pain Assessment: No/denies pain    Home Living                          Prior Function            PT Goals (current goals can now be found in the care plan section) Acute Rehab PT Goals Patient Stated Goal: to get stronger Potential to Achieve Goals: Good Progress towards PT goals: Progressing toward goals    Frequency    Min 4X/week      PT Plan Discharge plan needs to be updated;Frequency needs to be updated    Co-evaluation              AM-PAC PT "6 Clicks" Mobility   Outcome Measure  Help needed turning from your back to your side while in a flat bed without using bedrails?: A Little Help needed moving from lying on your back to sitting on the side of a flat bed without using bedrails?: A Little Help needed moving to and from a bed to a chair (including a wheelchair)?: A Little Help needed standing up from a chair using your arms (e.g., wheelchair or bedside chair)?: A Little Help needed to walk in hospital room?: A Lot Help needed climbing 3-5 steps with a railing? : Total 6 Click Score: 15    End of Session Equipment Utilized  During Treatment: Gait belt;Oxygen Activity Tolerance: Patient tolerated treatment well Patient left: in chair;with call bell/phone within reach;with chair alarm set;with family/visitor present Nurse Communication: Mobility status PT Visit Diagnosis: Other abnormalities of gait and mobility (R26.89)     Time: [id number] PT Time Calculation (min) (ACUTE ONLY): 28 min  Charges:  $Gait Training: 8-22 mins $Therapeutic Activity: 8-22 mins                     Wyona Almas, PT, DPT Acute Rehabilitation Services Pager [phone] Office [phone]    Deno Etienne 10/15/2021, 12:18 PM

## 2021-10-15 NOTE — Progress Notes (Signed)
Patient was seen at bedside at the request of RN to determine decision-making capacity.  The patient and his wife were present during the conversation.  Oscar Rosales is oriented to self, birthday, month, day of the week, spouse's name, and area that he is from ([place]).  When asked what we discussed with him this morning, he states that we talked about "getting fluid off of him."  When asked why he is here in the hospital, patient states he was admitted for heart attack.  Explained to him that he is here due to stroke and heart failure exacerbation.  Overall, I feel that the patient is oriented enough to make his own decisions at this time. I explained this to the patient and his wife at bedside. He was able to answer most of my orientation questions with the exception of the reason he is in the hospital, wonder if this could be due to poor healthcare literacy rather than from him being disoriented.  CTM.

## 2021-10-16 ENCOUNTER — Encounter (HOSPITAL_COMMUNITY)
Admission: EM | Disposition: A | Discharge status: Rehabilitation Facility | Payer: Self-pay | Source: Home / Self Care | Attending: Internal Medicine

## 2021-10-16 ENCOUNTER — Other Ambulatory Visit (HOSPITAL_COMMUNITY): Payer: BC Managed Care – PPO

## 2021-10-16 DIAGNOSIS — N179 Acute kidney failure, unspecified: Secondary | ICD-10-CM | POA: Diagnosis not present

## 2021-10-16 DIAGNOSIS — I5033 Acute on chronic diastolic (congestive) heart failure: Secondary | ICD-10-CM | POA: Diagnosis not present

## 2021-10-16 DIAGNOSIS — I130 Hypertensive heart and chronic kidney disease with heart failure and stage 1 through stage 4 chronic kidney disease, or unspecified chronic kidney disease: Secondary | ICD-10-CM | POA: Diagnosis not present

## 2021-10-16 DIAGNOSIS — I2699 Other pulmonary embolism without acute cor pulmonale: Secondary | ICD-10-CM | POA: Diagnosis not present

## 2021-10-16 DIAGNOSIS — N1832 Chronic kidney disease, stage 3b: Secondary | ICD-10-CM | POA: Diagnosis not present

## 2021-10-16 DIAGNOSIS — I5023 Acute on chronic systolic (congestive) heart failure: Secondary | ICD-10-CM | POA: Diagnosis not present

## 2021-10-16 DIAGNOSIS — I639 Cerebral infarction, unspecified: Secondary | ICD-10-CM | POA: Diagnosis not present

## 2021-10-16 DIAGNOSIS — I428 Other cardiomyopathies: Secondary | ICD-10-CM

## 2021-10-16 DIAGNOSIS — I77810 Thoracic aortic ectasia: Secondary | ICD-10-CM

## 2021-10-16 LAB — CBC
HCT: 41.3 % (ref 39.0–52.0)
Hemoglobin: 14 g/dL (ref 13.0–17.0)
MCH: 32.6 pg (ref 26.0–34.0)
MCHC: 33.9 g/dL (ref 30.0–36.0)
MCV: 96.3 fL (ref 80.0–100.0)
Platelets: 137 K/uL — ABNORMAL LOW (ref 150–400)
RBC: 4.29 MIL/uL (ref 4.22–5.81)
RDW: 15 % (ref 11.5–15.5)
WBC: 7.1 K/uL (ref 4.0–10.5)
nRBC: 0 % (ref 0.0–0.2)

## 2021-10-16 LAB — LUPUS ANTICOAGULANT PANEL
DRVVT: 62.1 s — ABNORMAL HIGH (ref 0.0–47.0)
PTT Lupus Anticoagulant: 49.1 s (ref 0.0–51.9)

## 2021-10-16 LAB — COOXEMETRY PANEL
Carboxyhemoglobin: 1.2 % (ref 0.5–1.5)
Carboxyhemoglobin: 1.3 % (ref 0.5–1.5)
Methemoglobin: 1.1 % (ref 0.0–1.5)
Methemoglobin: 0.7 % (ref 0.0–1.5)
O2 Saturation: 55.3 %
O2 Saturation: 59.4 %
Total hemoglobin: 14.2 g/dL (ref 12.0–16.0)
Total hemoglobin: 13.6 g/dL (ref 12.0–16.0)

## 2021-10-16 LAB — BASIC METABOLIC PANEL
Anion gap: 11 (ref 5–15)
BUN: 39 mg/dL — ABNORMAL HIGH (ref 6–20)
CO2: 26 mmol/L (ref 22–32)
Calcium: 8.5 mg/dL — ABNORMAL LOW (ref 8.9–10.3)
Chloride: 102 mmol/L (ref 98–111)
Creatinine, Ser: 2.05 mg/dL — ABNORMAL HIGH (ref 0.61–1.24)
GFR, Estimated: 37 mL/min — ABNORMAL LOW (ref >60)
Glucose, Bld: 112 mg/dL — ABNORMAL HIGH (ref 70–99)
Potassium: 3.9 mmol/L (ref 3.5–5.1)
Sodium: 139 mmol/L (ref 135–145)

## 2021-10-16 LAB — HOMOCYSTEINE: Homocysteine: 16 umol/L — ABNORMAL HIGH (ref 0.0–14.5)

## 2021-10-16 LAB — PROTEIN C ACTIVITY: Protein C Activity: 95 % (ref 73–180)

## 2021-10-16 LAB — DRVVT CONFIRM: dRVVT Confirm: 1.1 ratio (ref 0.8–1.2)

## 2021-10-16 LAB — HEPARIN LEVEL (UNFRACTIONATED): Heparin Unfractionated: 0.43 IU/mL (ref 0.30–0.70)

## 2021-10-16 LAB — PROTEIN S, TOTAL: Protein S Ag, Total: 82 % (ref 60–150)

## 2021-10-16 LAB — DRVVT MIX: dRVVT Mix: 44.1 s — ABNORMAL HIGH (ref 0.0–40.4)

## 2021-10-16 LAB — PROTEIN S ACTIVITY: Protein S Activity: 99 % (ref 63–140)

## 2021-10-16 SURGERY — ECHOCARDIOGRAM, TRANSESOPHAGEAL
Anesthesia: Monitor Anesthesia Care

## 2021-10-16 MED ORDER — MILRINONE LACTATE IN DEXTROSE 20-5 MG/100ML-% IV SOLN
0.2500 ug/kg/min | INTRAVENOUS | Status: DC
  Administered 2021-10-16 – 2021-10-18 (×4): 0.25 ug/kg/min via INTRAVENOUS
  Filled 2021-10-16 (×5): qty 100

## 2021-10-16 MED ORDER — HYDRALAZINE HCL 25 MG PO TABS
37.5000 mg | ORAL_TABLET | Freq: Three times a day (TID) | ORAL | Status: DC
  Administered 2021-10-16 – 2021-10-17 (×4): 37.5 mg via ORAL
  Filled 2021-10-16 (×4): qty 2

## 2021-10-16 NOTE — Progress Notes (Signed)
Inpatient Rehab Admissions Coordinator:   CIR is following. Pt. Is not yet medically ready for CIR, as he remains on IV lasix and IV milrinone. I spoke with pt.'s wife, Carletta, yesterday and she was not sure if Pt. Will have support at d/c. If family is not able to work out support. Pt. May potentially need SNF. I will let TOC know.  Clemens Catholic, [place], [place] Admissions Coordinator  [phone] ([place]) [phone] (office)

## 2021-10-16 NOTE — Progress Notes (Addendum)
Advanced Heart Failure Rounding Note  PCP-Cardiologist: None   Subjective:    1/30: DBA started for low output, initial Co-ox 44%   On DBA 2.5. Co-ox 55% today . Remains hypertensive.   SCr  2.08>>2.22>>2.03>>2.05   Bubble study negative.   Complaining of fatigue. Denies SOB.    Objective:   Weight Range: 64.8 kg Body mass index is 19.92 kg/m.   Vital Signs:   Temp:  [97.2 F (36.2 C)-98 F (36.7 C)] 98 F (36.7 C) (02/01 0735) Pulse Rate:  [96-112] 112 (02/01 0735) Resp:  [15-31] 31 (02/01 0700) BP: (146-172)/(100-134) 166/131 (02/01 0735) SpO2:  [90 %-97 %] 94 % (02/01 0735) Weight:  [64.8 kg] 64.8 kg (02/01 0323) Last BM Date: 10/13/21  Weight change: Filed Weights   10/14/21 1256 10/15/21 0422 10/16/21 0323  Weight: 74.9 kg 66.2 kg 64.8 kg    Intake/Output:   Intake/Output Summary (Last 24 hours) at 10/16/2021 0805 Last data filed at 10/16/2021 0325 Gross per 24 hour  Intake 703.82 ml  Output 1400 ml  Net -696.18 ml      Physical Exam   CVP 11-12 personally checked. General:  Appears weak.  No resp difficulty HEENT: normal Neck: supple. JVP 10-11 . Carotids 2+ bilat; no bruits. No lymphadenopathy or thryomegaly appreciated. Cor: PMI nondisplaced. Regular rate & rhythm. No rubs, gallops . +S3 . Lungs: clear Abdomen: soft, nontender, nondistended. No hepatosplenomegaly. No bruits or masses. Good bowel sounds. Extremities: no cyanosis, clubbing, rash, edema. RUE PICC Neuro: alert & orientedx3, cranial nerves grossly intact. moves all 4 extremities w/o difficulty. Affect flat    Telemetry  ST 100s personally reviewed.   EKG    No new EKG to review   Labs    CBC Recent Labs    10/15/21 0323 10/16/21 0500  WBC 8.5 7.1  HGB 13.6 14.0  HCT 41.3 41.3  MCV 95.4 96.3  PLT 141* 0000000*   Basic Metabolic Panel Recent Labs    10/15/21 1716 10/16/21 0500  NA 139 139  K 3.9 3.9  CL 104 102  CO2 27 26  GLUCOSE 110* 112*  BUN 40* 39*   CREATININE 1.97* 2.05*  CALCIUM 8.2* 8.5*  MG 1.9  --    Liver Function Tests No results for input(s): AST, ALT, ALKPHOS, BILITOT, PROT, ALBUMIN in the last 72 hours. No results for input(s): LIPASE, AMYLASE in the last 72 hours. Cardiac Enzymes No results for input(s): CKTOTAL, CKMB, CKMBINDEX, TROPONINI in the last 72 hours.  BNP: BNP (last 3 results) Recent Labs    10/11/21 1245  BNP 3,942.1*    ProBNP (last 3 results) No results for input(s): PROBNP in the last 8760 hours.   D-Dimer No results for input(s): DDIMER in the last 72 hours. Hemoglobin A1C No results for input(s): HGBA1C in the last 72 hours. Fasting Lipid Panel No results for input(s): CHOL, HDL, LDLCALC, TRIG, CHOLHDL, LDLDIRECT in the last 72 hours. Thyroid Function Tests No results for input(s): TSH, T4TOTAL, T3FREE, THYROIDAB in the last 72 hours.  Invalid input(s): FREET3  Other results:   Imaging    VAS [place] TRANSCRANIAL DOPPLER W BUBBLES  Result Date: 10/15/2021  Transcranial Doppler with Bubble Patient Name:  Oscar Rosales  Date of Exam:   10/15/2021 Medical Rec #: [id number]    Accession #:    [id number] Date of Birth: 1966/06/23   Patient Gender: M Patient Age:   1 years Exam Location:  [facility] Procedure:  VAS [place] TRANSCRANIAL DOPPLER W BUBBLES Referring Phys: [facility] --------------------------------------------------------------------------------  Indications: Stroke. Comparison Study: no prior Performing Technologist: Archie Patten RVS  Examination Guidelines: A complete evaluation includes B-mode imaging, spectral Doppler, color Doppler, and power Doppler as needed of all accessible portions of each vessel. Bilateral testing is considered an integral part of a complete examination. Limited examinations for reoccurring indications may be performed as noted.  Summary: No HITS at rest or during Valsalva. Negative transcranial Doppler Bubble study with no evidence of right to left  intracardiac communication.  A vascular evaluation was performed. The right middle cerebral artery was studied. An IV was inserted into the patient's right upper arm . Verbal informed consent was obtained.  *See table(s) above for TCD measurements and observations.    Preliminary      Medications:     Scheduled Medications:  Chlorhexidine Gluconate Cloth  6 each Topical Daily   furosemide  80 mg Intravenous BID   hydrALAZINE  25 mg Oral Q8H   rosuvastatin  20 mg Oral Daily   sodium chloride flush  10-40 mL Intracatheter Q12H    Infusions:  sodium chloride     DOBUTamine 2.5 mcg/kg/min (10/15/21 0314)   heparin 1,200 Units/hr (10/16/21 0726)    PRN Medications: acetaminophen **OR** acetaminophen (TYLENOL) oral liquid 160 mg/5 mL **OR** acetaminophen, guaiFENesin-dextromethorphan, senna-docusate, sodium chloride flush    Patient Profile   56 y.o. male with hx chronic systolic CHF/NICM, HTN, CKD IIIb, hx hepatitis C infection, noncompliance with medical therapy. Admitted with CVA, PE and acute on chronic systolic CHF w/ low output. [facility] been [facility] at [facility].  Echo with biventricular hypertrophy/dysfunction EF 15-20% Moderate MR moderate RV dysfunction.  Assessment/Plan   Acute on chronic systolic CHF/NICM: -Diagnosed 2018. Normal coronaries on LHC in 2019. Felt to be due to uncontrolled hypertension. -EF previously recovered to 45-50%.  -EF down to 25-30% in 06/21 in setting of uncontrolled HTN.  -Echo 01/23: EF 15-20%, RV moderately reduced, severe biatrial enlargement, moderate MR, moderate TR, moderate AI -Volume overloaded w/ low output, initial co-ox 44%.  Remains hypertensive. Stop DBA and switch to milrinone 0.25 mcg. Repeat CO-OX 2 hours after starting.  - CVP 11-12. Continue IV lasix 80 mg bid + TED hoses.   -No beta blocker with a/c CHF -Now off losartan with AKI on CKD -Eventual SGLT2i. A1c 5.2% -Continue hydralazine 25 mg tid.  -May need RHC depending on  coarse -Likely not candidate for advanced therapies currently d/t noncompliance and renal impairment.   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 Scattered infarcts on MRI brain -No large vessel occlusion on CTA head and neck -Negative bubble study on echo -Scheduled for TEE tomorrow  -Neurology following   3. PE: -Incidentally noted on CTA chest -On heparin gtt -LE dopplers negative for DVT -Hypercoagulable labs pending   4. AKI on CKD IIIb: -Baseline Scr 1.7 -SCr peaked to 2.22 -->today 2.05 -Renal US c/w medical renal disease. -suspect cardiorenal from low output HF   Length of Stay: 4  Amy Clegg, NP  10/16/2021, 8:05 AM  Advanced Heart Failure Team Pager [phone] (M-F; 7a - 5p)  Please contact [facility] for night-coverage after hours (5p -7a ) and weekends on [email or web address]  Patient seen and examined with the above-signed Advanced Practice Provider and/or Housestaff. I personally reviewed laboratory data, imaging studies and relevant notes. I independently examined the patient and formulated the important aspects of the plan. I have edited the note to reflect any of my changes or salient  points. I have personally discussed the plan with the patient and/or family.  Says he feels ok. Looks SOB on exam.   DBA switched to milrinone earlier today due to HTN.   CVP 11-12  General:  Weak appearing. Appears dyspneic HEENT: normal Neck: supple. JVP to jaw Carotids 2+ bilat; no bruits. No lymphadenopathy or thryomegaly appreciated. Cor: PMI nondisplaced. Tachy regular + s3.  Lungs: clear Abdomen: soft, nontender, nondistended. No hepatosplenomegaly. No bruits or masses. Good bowel sounds. Extremities: no cyanosis, clubbing, rash, edema Neuro: alert & orientedx3, cranial nerves grossly intact. moves all 4 extremities w/o difficulty. RUE weak RLE weak   He remains very tenuous. Continues to be inotropes dependent. Volume overloaded. Will switch to milrinone. Continue diuresis and continue to  titrate GDMT.  Currently not candidate for advanced therapies given comorbidities. Hopefully he will continue to improve with Neuro rehab and HF treatment. Will plan TEE next week.   Glori Bickers, MD  11:52 AM

## 2021-10-16 NOTE — Plan of Care (Signed)
Discussed with patient plan of care for the evening, pain management and medications with some teach back displayed.  Called wife who states no oxygen at home or history of sleep apnea.  He has had no sleep study done.  Problem: Education: Goal: Knowledge of General Education information will improve Description: Including pain rating scale, medication(s)/side effects and non-pharmacologic comfort measures Outcome: Progressing   Problem: Health Behavior/Discharge Planning: Goal: Ability to manage health-related needs will improve Outcome: Progressing

## 2021-10-16 NOTE — Progress Notes (Addendum)
Subjective: 8 runs of Vtach overnight. Pt was asymptomatic. Mag collected. EKG showing ST.   Earlier this morning Rapid called due to cheyne/stokes respiratory patten  and AMS. CXR with pulmonary edema versus developing infiltrate. Exam with fluid overload, and hypertensive in 170-180 range. He was given IV hydral. EKG unremarkable; amiodarone given for PVCs. LUE /LLE twitching also noticed, which is new.   Pt reports feeling "good". No change in mental status, or any new acute focal deficits.   Objective:  Vital signs in last 24 hours: Vitals:   10/16/21 0700 10/16/21 0735 10/16/21 0800 10/16/21 0900  BP: (!) 172/134 (!) 166/131 (!) 159/127 (!) 176/125  Pulse: (!) 104 (!) 112 (!) 115 100  Resp: (!) 31     Temp:  98 F (36.7 C)    TempSrc:  Oral    SpO2: 94% 94% (!) 87% 94%  Weight:      Height:       Constitutional: alert most of the time, ill-appearing, in mild distress HENT: normocephalic, atraumatic, mucous membranes moist Eyes: Dilated pupil on the left side. Intranuclear ophthalmoplegia noted. Cardiovascular: RRR, no m/r/g, 1+ bilateral LE edema  Pulmonary/Chest: apneic, diminished breath sounds bilaterally.  Extremities: Twitching of all extremities  Neurological: A&O to person placed and situation, not to time.   Tongue slightly deviates to the right. Bilateral ptosis is present.  4/5 strength of right upper and lower extremity 5/5 strength of left upper and lower extremities Lack of coordination of fine motor movements of the right hand   Assessment/Plan:  Principal Problem:   Acute CVA (cerebrovascular accident) ([place]) Active Problems:   Hypertension, uncontrolled   Non-ischemic cardiomyopathy ([place])   Stage 3 chronic kidney disease ([place])   Acute pulmonary embolism (HCC)  Oscar Rosales is a 56 y.o. male with a pertinent PMH of nonischemic cardiomyopathy, HFrEF (EF 25-30% 02/2020), HLD, HTN, and CKD3b admitted for acute ischemic stroke 2/2 embolic infarctions, mild  nonocclusive pulmonary emboli, and acute on chronic HFrEF exacerbation.    Multifocal ischemic brain infarcts Suspecting embolic stroke Stroke workup negative thus far. Stroke team re-consulted today d/t concerns for concerns of change in mental status, twitching, and changes in breathing pattern. EEG and CT head not impressive. Neuro believes this is likely multifactorial encephalopathy related to respiratory and cardiac failure, and possibly amiodarone. Amiodarone-related unlikely given it was just started this AM. Recommending amiodarone level and thyroid function tests. Will obtain MRI head for further eval at this time. Plan for TEE once clinically stable, and cardiac monitor after discharge. High risk for hemorrhagic conversion, goal to gradually normalize. BP remains elevated despite up-titrating hydral. Overall, difficult case and poor prognosis in this patient given multiple comorbidities and limited progress made thus far.  - Neurology signed off. Final recs include continuing IV heparin and transition to NOAC for anticoagulation at d/c.   - Continue hydralazine 37.5 tid  - Continue milrinone 0.25 mcg d/t HTN - Continue Heparin infusion; switch to NOAC at d/c.  - Follow up on amiodarone levels and thyroid studies  - LDL of 114, continue Crestor 20 mg - Telemetry - Frequent neuro checks  - PT/OT/SLP - CIR vs SNF, pending medical stabilization    Mild nonocclusive pulmonary emboli  Unclear source. Hypercoagulably workup largely unremarkable till now; awaiting factor 5 Leiden and prothrombin gene mutation. May need repeat hypercoagulable panel at 6 to 9 months if anticoagulation is discontinued. TTE showed EF of 15-20% with severely decreased left ventricle function, plan for TEE next  week. - TEE per cards  - Continue on heparin infusion, will likely need DOAC loading dose. - Follow-up on remaining hypercoagulable studies    AoC HFrEF exacerbation Nonischemic hypertensive  cardiomyopathy Pulmonary edema from uncontrolled Hypertension   Did not diurese well yesterday, put out 4.4cc, with net 3.3L. Weight continues to trend down 77.1 admission -> 63.1 kg today. Great improvement in LE edema. Ongoing HTN not helping pulmonary edema, will continue to optimize medications. Per HF team, currently not candidate for advanced therapies given comorbidities, but will possibly trial Entresto.  - Continue IV Lasix 80 mg bid - Continue Milrinone 0.25 mcg  - Daily weights and I/O's - Cardiac monitoring  - hydralazine 37.5 tid; hold off on restarting BB and ARB for now--discuss restarting ARB when kidney fxn near baseline, BB when HF stabilized   AKI on CKD3b Baseline Cr 1.6-1.8. Cr stable at 2 past several days. Likely cardiorenal from low output HF. - Continue IV Lasix as above - Trend BMP - Avoid nephrotoxins    Stable ascending thoracic aortic dilatation  4.2 cm in size. - Annual imaging follow-up recommended by CTA or MRA     Best Practice: Diet: Dysphagia 3  IVF: None, None VTE: Heparin Code: Full PT/OT: CIR  [name], MD  Internal Medicine Resident, PGY-1 Pager: [phone] After 5pm on weekdays and 1pm on weekends: On Call pager [phone]

## 2021-10-16 NOTE — Progress Notes (Signed)
Speech Language Pathology Treatment: Cognitive-Linquistic  Patient Details Name: [place] Wieber MRN: [id number] DOB: Sep 24, 1965 Today's Date: 10/16/2021 Time: 1100-1108 SLP Time Calculation (min) (ACUTE ONLY): 8 min  Assessment / Plan / Recommendation Clinical Impression  Treatment focused on cognition, primarily attention. Patient upright in chair, awake but very lethargic which nursing confirmed has been for much of the morning. He is oriented to self and place, perseverating on various birthdays however. Patient required moderate-max verbal cueing for sustained attention to basic self care tasks (washing face, self feeding) for 5 minutes, stopping every 30 seconds, requiring cueing to continue. Treatment limited by lethargy today. Will continue to f/u.    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 Pt had also had vision changes starting the day before admission. MRI revealed multifocal ischemic infarcts involving the right cerebellar hemisphere, left cerebral peduncle, left hippocampus, left thalamus, right occipital lobe, and left pre and postcentral gyri. PMH includes: HFrEF (EF 25-30% 02/2020), HLD, HTN, CAD, and CKD3b      SLP Plan  Continue with current plan of care      Recommendations for follow up therapy are one component of a multi-disciplinary discharge planning process, led by the attending physician.  Recommendations may be updated based on patient status, additional functional criteria and insurance authorization.    Recommendations                   Plan: Continue with current plan of care         [facility] MA, [place]  10/16/2021, 11:12 AM

## 2021-10-16 NOTE — Progress Notes (Signed)
Patient refused standing weight due to being tired this morning.  Will have next shift attempt a standing weight

## 2021-10-16 NOTE — Progress Notes (Signed)
ANTICOAGULATION CONSULT NOTE - Follow Up Consult  Pharmacy Consult for Heparin Indication: pulmonary embolus and stroke  Allergies  Allergen Reactions   Lisinopril Cough    SWITCHED TO LOSARTAN    Patient Measurements: Height: 5\' 11"  (180.3 cm) Weight: 64.8 kg (142 lb 13.7 oz) IBW/kg (Calculated) : 75.3 Heparin Dosing Weight: 77.1 kg   Vital Signs: Temp: 97.5 F (36.4 C) (02/01 1224) Temp Source: Oral (02/01 1224) BP: 172/136 (02/01 1342) Pulse Rate: 100 (02/01 1224)  Labs: Recent Labs    10/14/21 0234 10/15/21 0323 10/15/21 1716 10/16/21 0500  HGB 14.3 13.6  --  14.0  HCT 43.4 41.3  --  41.3  PLT 127* 141*  --  137*  HEPARINUNFRC 0.29* 0.31  --  0.43  CREATININE 2.22* 2.03* 1.97* 2.05*     Estimated Creatinine Clearance: 36.9 mL/min (A) (by C-G formula based on SCr of 2.05 mg/dL (H)).   Medications:  Scheduled:   Chlorhexidine Gluconate Cloth  6 each Topical Daily   furosemide  80 mg Intravenous BID   hydrALAZINE  37.5 mg Oral Q8H   rosuvastatin  20 mg Oral Daily   sodium chloride flush  10-40 mL Intracatheter Q12H   Infusions:   sodium chloride     heparin 1,200 Units/hr (10/16/21 0726)   milrinone 0.25 mcg/kg/min (10/16/21 0859)    Assessment: 56 yo M continues on IV heparin for PE and scattered embolic infarcts.    Heparin level continues to be at  goal on 1200 units/hr. No bleeding noted. Hgb now stable 14. Plt count stable.   Will target lower end of heparin goal in the setting of acute infarcts as well.  Apixaban copay check ($47) done yesterday.   Goal of Therapy:  Heparin level 0.3-0.5 units/ml Monitor platelets by anticoagulation protocol: Yes   Plan:  Continue heparin at 1200 units/hr Heparin level and CBC with AM labs. Follow-up plans for oral anticoagulation. Monitor for s/sx bleeding.  56 PharmD., BCPS Clinical Pharmacist 10/16/2021 1:50 PM

## 2021-10-16 NOTE — Progress Notes (Addendum)
Physical Therapy Treatment Patient Details Name: Oscar Rosales MRN: [id number] DOB: [birth year] Today's Date: 10/16/2021   History of Present Illness Pt is a 56 y/o male admitted secondary to fatigue and syncopal episode at work. 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 Pt also found to have mild nonocclusive pulmonary emboli and started on Heparin drip. PMH including but not limited to HFrEF (EF 25-30% 02/2020), HLD, HTN, CAD, and CKD3b.    PT Comments    Pt more lethargic this session, but still agreeable to participate and following commands. BP supine, 176/125 (140). Pt requiring moderate assist to sit up to edge of bed. BP reassessed to be 178/138 (151) with neurology goal in chart of permissive HTN (<220/120). Therefore, deferred ambulation today and RN notified. Pt transferred to chair with min assist. HR 110-120 bpm, SpO2 95% on 2L O2. Will continue to progress as tolerated.    Recommendations for follow up therapy are one component of a multi-disciplinary discharge planning process, led by the attending physician.  Recommendations may be updated based on patient status, additional functional criteria and insurance authorization.  Follow Up Recommendations  Acute inpatient rehab (3hours/day)     Assistance Recommended at Discharge Frequent or constant Supervision/Assistance  Patient can return home with the following A lot of help with walking and/or transfers;A lot of help with bathing/dressing/bathroom;Assistance with cooking/housework;Direct supervision/assist for medications management;Assist for transportation;Help with stairs or ramp for entrance   Equipment Recommendations  Other (comment) (defer)    Recommendations for Other Services Rehab consult      Precautions / Restrictions Precautions Precautions: Fall;Other (comment) Precaution Comments: watch HR, BP Restrictions Weight Bearing Restrictions: No     Mobility  Bed Mobility Overal bed mobility: Needs Assistance Bed Mobility: Supine to Sit Rolling: Mod assist         General bed mobility comments: Pt initiating well, but ultimately becoming fatigued, requiring assist for BLE's off edge of bed and trunk to upright    Transfers Overall transfer level: Needs assistance Equipment used: Rolling walker (2 wheels) Transfers: Sit to/from Stand, Bed to chair/wheelchair/BSC Sit to Stand: Min assist, +2 physical assistance Stand pivot transfers: Min assist, +2 physical assistance         General transfer comment: MinA  to rise from edge of bed, assist for placing R hand on walker and cues for pushing up with left hand. Pivoting towards left to recliner    Ambulation/Gait                   Stairs             Wheelchair Mobility    Modified Rankin (Stroke Patients Only)       Balance Overall balance assessment: Needs assistance Sitting-balance support: Feet supported Sitting balance-Leahy Scale: Fair     Standing balance support: Bilateral upper extremity supported Standing balance-Leahy Scale: Poor                              Cognition Arousal/Alertness: Lethargic Behavior During Therapy: Flat affect, WFL for tasks assessed/performed Overall Cognitive Status: Impaired/Different from baseline Area of Impairment: Orientation, Attention, Memory, Following commands, Safety/judgement, Awareness, Problem solving  Orientation Level: Disoriented to, Time, Situation Current Attention Level: Selective Memory: Decreased short-term memory Following Commands: Follows one step commands consistently Safety/Judgement: Decreased awareness of safety, Decreased awareness of deficits Awareness: Emergent Problem Solving: Slow  processing, Requires verbal cues General Comments: Pt more drowsy this session, still able to follow commands. Not oriented to year, stating it was 2022, but able to correct to 2023 with min cueing        Exercises General Exercises - Lower Extremity Heel Slides: Both, 10 reps, Supine    General Comments        Pertinent Vitals/Pain Pain Assessment Pain Assessment: No/denies pain    Home Living                          Prior Function            PT Goals (current goals can now be found in the care plan section) Acute Rehab PT Goals Patient Stated Goal: to get stronger Potential to Achieve Goals: Good    Frequency    Min 4X/week      PT Plan Current plan remains appropriate    Co-evaluation              AM-PAC PT "6 Clicks" Mobility   Outcome Measure  Help needed turning from your back to your side while in a flat bed without using bedrails?: A Little Help needed moving from lying on your back to sitting on the side of a flat bed without using bedrails?: A Lot Help needed moving to and from a bed to a chair (including a wheelchair)?: A Little Help needed standing up from a chair using your arms (e.g., wheelchair or bedside chair)?: A Little Help needed to walk in hospital room?: A Lot Help needed climbing 3-5 steps with a railing? : Total 6 Click Score: 14    End of Session Equipment Utilized During Treatment: Gait belt;Oxygen Activity Tolerance: Treatment limited secondary to medical complications (Comment) (elevated BP) Patient left: in chair;with call bell/phone within reach;with chair alarm set;with family/visitor present Nurse Communication: Mobility status PT Visit Diagnosis: Other abnormalities of gait and mobility (R26.89)     Time: 1000-1020 PT Time Calculation (min) (ACUTE ONLY): 20 min  Charges:  $Therapeutic Activity: 8-22 mins                     Oscar Rosales, PT, DPT Acute Rehabilitation Services Pager  [phone] Office [phone]    Oscar Rosales 10/16/2021, 12:52 PM

## 2021-10-16 NOTE — Progress Notes (Addendum)
STROKE TEAM PROGRESS NOTE   INTERVAL HISTORY Patient is seen in his room with no family at the bedside.  He has been placed on milrinone by heart failure team and was too unstable for TEE today.  TCD bubble study performed yesterday was negative.  He remains mildly short of breath and is on nasal cannula oxygen.  He remains on IV heparin with level optimal at 0.43 today.  IgM anticardiolipin antibodies mildly elevated in a titer of 17 however beta-2 glycoprotein is negative.  Lupus anticoagulant is not yet back Vitals:   10/16/21 0700 10/16/21 0735 10/16/21 0800 10/16/21 0900  BP: (!) 172/134 (!) 166/131 (!) 159/127 (!) 176/125  Pulse: (!) 104 (!) 112 (!) 115 100  Resp: (!) 31     Temp:  98 F (36.7 C)    TempSrc:  Oral    SpO2: 94% 94% (!) 87% 94%  Weight:      Height:       CBC:  Recent Labs  Lab 10/15/21 0323 10/16/21 0500  WBC 8.5 7.1  HGB 13.6 14.0  HCT 41.3 41.3  MCV 95.4 96.3  PLT 141* 137*    Basic Metabolic Panel:  Recent Labs  Lab 10/15/21 1716 10/16/21 0500  NA 139 139  K 3.9 3.9  CL 104 102  CO2 27 26  GLUCOSE 110* 112*  BUN 40* 39*  CREATININE 1.97* 2.05*  CALCIUM 8.2* 8.5*  MG 1.9  --     Lipid Panel:  Recent Labs  Lab 10/12/21 0117  CHOL 167  TRIG 40  HDL 45  CHOLHDL 3.7  VLDL 8  LDLCALC 114*    HgbA1c:  Recent Labs  Lab 10/11/21 2256  HGBA1C 5.2    Urine Drug Screen:  Recent Labs  Lab 10/11/21 1358  LABOPIA NONE DETECTED  COCAINSCRNUR NONE DETECTED  LABBENZ NONE DETECTED  AMPHETMU NONE DETECTED  THCU NONE DETECTED  LABBARB NONE DETECTED     Alcohol Level  Recent Labs  Lab 10/11/21 1330  ETH <10     IMAGING past 24 hours VAS [place] TRANSCRANIAL DOPPLER W BUBBLES  Result Date: 10/15/2021  Transcranial Doppler with Bubble Patient Name:  PARDEEP ARCOS  Date of Exam:   10/15/2021 Medical Rec #: [id number]    Accession #:    [id number] Date of Birth: 06/02/1966   Patient Gender: M Patient Age:   56 years Exam Location:  [facility] Procedure:      VAS [place] TRANSCRANIAL DOPPLER W BUBBLES Referring Phys: Lillia Abed [facility] --------------------------------------------------------------------------------  Indications: Stroke. Comparison Study: no prior Performing Technologist: [place] Ponder RVS  Examination Guidelines: A complete evaluation includes B-mode imaging, spectral Doppler, color Doppler, and power Doppler as needed of all accessible portions of each vessel. Bilateral testing is considered an integral part of a complete examination. Limited examinations for reoccurring indications may be performed as noted.  Summary: No HITS at rest or during Valsalva. Negative transcranial Doppler Bubble study with no evidence of right to left intracardiac communication.  A vascular evaluation was performed. The right middle cerebral artery was studied. An IV was inserted into the patient's right upper arm . Verbal informed consent was obtained.  *See table(s) above for TCD measurements and observations.    Preliminary     PHYSICAL EXAM  Physical Exam  Constitutional: Ill appearing, frail middle-aged [place] American male.  Cardiovascular: Tachycardic and regular rhythm.  Respiratory: Labored, tachypneic and short of breath    NEURO:  Mental Status: AA&Ox3  Speech/Language: speech  is without dysarthria or aphasia.    Cranial Nerves:  II: PERRL.  III, IV, VI: Restricted upward gaze, bilateral ptosis, bilateral INO V: Sensation is intact to light touch and symmetrical to face.  VII: Slight right facial droop VIII: hearing intact to voice. IX, X: Phonation is normal.  XII: tongue is midline without fasciculations. Sensation- Intact to light touch bilaterally.   Gait- deferred    ASSESSMENT/PLAN Mr. Whitten Andreoni is a 56 y.o. male with history of CHF, ascending thoracic aortic dilation, HTN, noncompliance with medications, CKD III presenting with AMS. He states that he woke up feeling tired. Went to work, and shortly after  abruptly started to feel lightheaded and before he knew it, he passed out. Does not remember falling down, but woke up on ground. Unsure how long he was down, possibly 10-20 minutes. No witnesses of the the event. Someone found him down on the bathroom and called EMS. 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 He is currently on a heparin drip. MRI head shows scattered infarcts involving the right cerebellar hemisphere, left cerebral peduncle, left hippocampus, left thalamus, right occipital lobe, and left pre and postcentral gyri, some of which appear acute while others appear more subacute in chronicity. He will need a TEE and cardiac monitor (30 day or loop), and possible hypercoag panel and cardiology consult. TCD bubble study performed yesterday was negative, and TEE will be performed next week.  Stroke team will sign off but will remain available for and questions or concerns.  Stroke:  Scattered infarcts involving multiple territories likely secondary embolic cryptogenic source.  Concurrent pulmonary emboli raise possibility of hypercoagulability or paradoxical embolism Code Stroke- No acute abnormality. Small vessel disease.  CTA head & neck No large vessel occlusion, hemodynamically significant stenosis, or evidence of dissection. 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  CXR- Cardiomegaly. There is pulmonary vascular congestion and pulmonary edema in both lungs. Possibility of underlying pneumonia is not excluded MRI- Scattered infarcts involving the right cerebellar hemisphere, left cerebral peduncle, left hippocampus, left thalamus, right occipital lobe, and left pre and postcentral gyri, some of which appear acute while others appear more subacute in chronicity. CTA Chest- Mild nonocclusive pulmonary emboli within the right middle lobe pulmonary artery and right lower lobe medial segment  pulmonary artery. Marked cardiomegaly, possibly mildly worsened from 08/26/2019. Small right and trace left pleural effusions. Scattered ground-glass opacities compatible with pulmonary edema, subsegmental atelectasis, and/or a mild infectious/inflammatory process 2D Echo - EF 15-20%, severely decrease LV function BLE Venous duplex- No evidence of DVT in lower extremities TCD bubble study negative  LDL 114 HgbA1c 5.2 VTE prophylaxis - Heparin IV    Diet   Diet Heart Room service appropriate? Yes; Fluid consistency: Thin   No antithrombotic prior to admission, now on heparin IV.  Therapy recommendations:  CIR Disposition:  Pending  Pulmonary Embolism Heparin IV protocol Management by primary team  AoC HFrEF exacerbation Home meds: Imdur, digoxin, torsemide Recent titration of torsemide r/t increasing BNP and sodium intake Echo pending  Hypertension Home meds:  hydralazine 100mg , carvedilol, clonidine 0.3 patch, cozaar Unstable- non compliant with home medication for approximately 3 months Permissive hypertension (OK if < 220/120) but gradually normalize in 5-7 days Long-term BP goal normotensive  Hyperlipidemia Home meds:  None LDL 114, goal < 70 Add Crestor  Continue statin at discharge  Other Stroke Risk Factors Coronary artery disease Congestive heart  failure Se[facility] by [facility] Consider cardiology consult   Chronic Kidney Disease- follows with [facility] nephrology Dr. Lequita Halt Monitor I&O Creatinine baseline 1.7-2.0 Cr elevated 1.8-> 2.08 -> 2.03-> 2.05  Variably positive ANA  Antibody testing negative. His initial ANA was negative, retest positive, antibody screening reassuring Done outpatient at [facility] day # 4  Patient seen and examined by NP/APP with MD. MD to update note as needed.   Cortney E Ernestina Columbia , MSN, AGACNP-BC [facility] See Amion for schedule and pager information 10/16/2021 12:16  PM   I have personally obtained history,examined this patient, reviewed notes, independently viewed imaging studies, participated in medical decision making and plan of care.ROS completed by me personally and pertinent positives fully documented  I have made any additions or clarifications directly to the above note. Agree with note above.  Patient remains neurologically stable but with heart failure and short of breath due to pulmonary embolism.  Continue IV heparin and will need to transition to NOAC for anticoagulation at discharge.  TEE scheduled for next week.  IgM anticardiolipin antibodies weakly positive but await lupus anticoagulant results.  May need repeat hypercoagulable panel at 6 to 9 months if anticoagulation is discontinued.  Greater than 50% time during this 50-minute visit was spent on counseling and coordination of care and discussion with patient and care team and answering questions.  Stroke team will sign off.  Kindly call for questions.  Delia Heady, MD Medical Director [facility] Stroke Center Pager: [phone] 10/16/2021 2:29 PM    To contact Stroke Continuity provider, please refer to [email or web address]. After hours, contact General Neurology

## 2021-10-16 NOTE — TOC Progression Note (Addendum)
Transition of Care [facility]) - Progression Note    Patient Details  Name: Oscar Rosales MRN: [id number] Date of Birth: 05-24-1966  Transition of [facility]) CM/SW Contact  Sinjin Amero, LCSW Phone Number: 10/16/2021, 12:10 PM  Clinical Narrative:    CSW received consult for possible SNF placement at time of discharge as CIR may not be able to offer a bed if there isn't support at home for Mr. Remer. CSW spoke with patient. Patient reported that patient's spouse is currently unable to care for patient at their home given patients current physical needs and fall risk. Patient expressed understanding of PT recommendation and is agreeable to SNF placement at time of discharge if CIR is not an option. Mr. Alicea is agreeable for the CSW to speak with his wife, Ms. Philis Pique [phone]. CSW reached out to Ms. Wuthrich who reported that she is on her way to the hospital and will speak with the CSW in person around 12:30pm. HF CSW went to Mr. Kondracki room again around 12:45pm and Ms. Nazari had not yet arrived and CSW will follow back up again later and left paperwork for her to reach out when she arrives. 3:00pm - HF CSW spoke with Ms. Mullings at bedside with Mr. Rishel to discuss other options to CIR including SNF or [facility] inpatient rehab. Patient and spouse reports preference for no particular facility. CSW discussed insurance authorization process and provided Medicare SNF ratings list. Patient has received the COVID vaccines. CSW will send out referrals for review. Patient expressed being hopeful for rehab and to feel better soon. No further questions reported at this time.  Skilled Nursing Rehab Facilities-   [email or web address]   Ratings out of 5 possible   Name Address  Phone # Quality Care Staffing Health Inspection Overall  [facility] [address] [place] [phone] 5 5 2 4   [facility]  [address] [place], [place] [phone] 4 2 5 5   [facility] [address] [place], [address] [place] 4 1 1 1   [facility] [address] [place] 2 2 4 4   [facility] [address], [phone] 2 1 2 1   [facility] [phone] [address], [phone] 3 1 4 3   [facility] [address], [address] [place] 5 2 2 3   [facility] [address] [facility] [place] 4 1 2 1   [address] ([place]) [address], [facility] 5 1 2 2   [facility] [phone], [phone] 4 1 1 1   [facility] [address] [facility] [phone] 4 1 2 1   [facility] ([place]) [address], [place]in[place] Otto [phone] 4 1 1 1           [facility] [address] [facility]      [facility] [address], [facility] [place] 4 2 3 3   [facility] [place] [address], [place] [phone] 3 1 5 4   [facility] [place] [address] [facility] [address], [place] [phone] 2 1 1 1   [facility] [address], [place] [phone] 2 2 3 3           [address] (no [facility] [address], [place] [phone] 4 5 5 5   [facility] (No [employer]) [address] [place] [phone] 4 1 4 3   [facility] (No [employer]) [address], [place] [phone] 5 5 5 5   [facility] [address], [employer]  [place] [phone] 3 3 4 4   [facility] [address], [address]  [phone] 2 2 2 2   [phone] [address] Eligha Bridegroom [address] 3 1 3 2   [facility] [address] [place] [phone] 1 1 2 1   [facility] [address] [place] [phone] 2 1 1 1   [facility] [address] [place] [phone] 5 2 4 5   [facility] [address] [place] [phone] 2 1 1 1   [facility] [address] [facility] 3 1 1 1   [facility] [address] [place], [facility] [address] 2 1  2 1           [facility] [place] [address], [phone] 5 3 3 4   [facility] [place] [address] [place], [place] [phone] 2 1 1 1   [facility] (No Humana) [address] [phone] 2 1 2 1   [facility] [address] [phone] 3 1 1 1           [facility] [address] [place], [address] 4 1 5 4   [facility]) [place] [address] [facility] [address] 2 1 3 2   [facility]) [address], [facility] 3 1 4 3   [facility] [address] [place] [phone] 4 1 4 3   [address] [place], [address] [place] 3 3 1 1   [phone] [facility]) [address] [place] [phone] 3 2 3 3     CSW will continue to follow throughout discharge.   Expected Discharge Plan: IP Rehab Facility Barriers to Discharge: Continued Medical Work up  Expected Discharge Plan and Services Expected Discharge Plan: IP Rehab Facility In-house Referral: Clinical Social Work Discharge Planning Services: CM Consult Post Acute Care Choice: IP Rehab Living arrangements for the past 2 months: Single Family Home                                       Social Determinants of Health (SDOH) Interventions    Readmission Risk Interventions No flowsheet data found.  Eurydice Calixto, MSW, LCSWA [phone] Heart Failure Social Worker

## 2021-10-16 NOTE — Plan of Care (Signed)
Discussed with patient plan of care for the evening, pain management and bedtime medications with some teach back displayed.  Problem: Education: Goal: Knowledge of General Education information will improve Description: Including pain rating scale, medication(s)/side effects and non-pharmacologic comfort measures Outcome: Progressing   Problem: Health Behavior/Discharge Planning: Goal: Ability to manage health-related needs will improve Outcome: Progressing   

## 2021-10-17 ENCOUNTER — Inpatient Hospital Stay (HOSPITAL_COMMUNITY): Payer: BC Managed Care – PPO

## 2021-10-17 DIAGNOSIS — I428 Other cardiomyopathies: Secondary | ICD-10-CM | POA: Diagnosis not present

## 2021-10-17 DIAGNOSIS — I639 Cerebral infarction, unspecified: Secondary | ICD-10-CM | POA: Diagnosis not present

## 2021-10-17 DIAGNOSIS — I5023 Acute on chronic systolic (congestive) heart failure: Secondary | ICD-10-CM | POA: Diagnosis not present

## 2021-10-17 DIAGNOSIS — R569 Unspecified convulsions: Secondary | ICD-10-CM

## 2021-10-17 LAB — BLOOD GAS, ARTERIAL
Acid-Base Excess: 1.6 mmol/L (ref 0.0–2.0)
Bicarbonate: 24.2 mmol/L (ref 20.0–28.0)
FIO2: 28
O2 Saturation: 95.7 %
Patient temperature: 36.6
pCO2 arterial: 28.2 mmHg — ABNORMAL LOW (ref 32.0–48.0)
pH, Arterial: 7.541 — ABNORMAL HIGH (ref 7.350–7.450)
pO2, Arterial: 74.9 mmHg — ABNORMAL LOW (ref 83.0–108.0)

## 2021-10-17 LAB — BASIC METABOLIC PANEL
Anion gap: 12 (ref 5–15)
BUN: 38 mg/dL — ABNORMAL HIGH (ref 6–20)
CO2: 26 mmol/L (ref 22–32)
Calcium: 8.7 mg/dL — ABNORMAL LOW (ref 8.9–10.3)
Chloride: 103 mmol/L (ref 98–111)
Creatinine, Ser: 2.03 mg/dL — ABNORMAL HIGH (ref 0.61–1.24)
GFR, Estimated: 38 mL/min — ABNORMAL LOW (ref >60)
Glucose, Bld: 108 mg/dL — ABNORMAL HIGH (ref 70–99)
Potassium: 3.5 mmol/L (ref 3.5–5.1)
Sodium: 141 mmol/L (ref 135–145)

## 2021-10-17 LAB — COOXEMETRY PANEL
Carboxyhemoglobin: 1.2 % (ref 0.5–1.5)
Methemoglobin: 0.8 % (ref 0.0–1.5)
O2 Saturation: 58.8 %
Total hemoglobin: 14.6 g/dL (ref 12.0–16.0)

## 2021-10-17 LAB — CBC
HCT: 43.3 % (ref 39.0–52.0)
Hemoglobin: 14.5 g/dL (ref 13.0–17.0)
MCH: 32 pg (ref 26.0–34.0)
MCHC: 33.5 g/dL (ref 30.0–36.0)
MCV: 95.6 fL (ref 80.0–100.0)
Platelets: 152 10*3/uL (ref 150–400)
RBC: 4.53 MIL/uL (ref 4.22–5.81)
RDW: 14.8 % (ref 11.5–15.5)
WBC: 10.2 10*3/uL (ref 4.0–10.5)
nRBC: 0 % (ref 0.0–0.2)

## 2021-10-17 LAB — T4, FREE: Free T4: 1.45 ng/dL — ABNORMAL HIGH (ref 0.61–1.12)

## 2021-10-17 LAB — TSH: TSH: 3.545 u[IU]/mL (ref 0.350–4.500)

## 2021-10-17 LAB — HEPARIN LEVEL (UNFRACTIONATED): Heparin Unfractionated: 0.44 IU/mL (ref 0.30–0.70)

## 2021-10-17 LAB — MAGNESIUM: Magnesium: 2 mg/dL (ref 1.7–2.4)

## 2021-10-17 IMAGING — CT CT HEAD W/O CM
4 series · 16 of 47 positions shown, 18 images · non-contrast
Comparison: MRI [DATE]

CLINICAL DATA: Insert is



[Series 3: head without · axial · non-contrast · 0.42mm/px · z∈[-102,+18]mm · 7 of 33 slices shown, 9 images]
[im 5/33  brain]
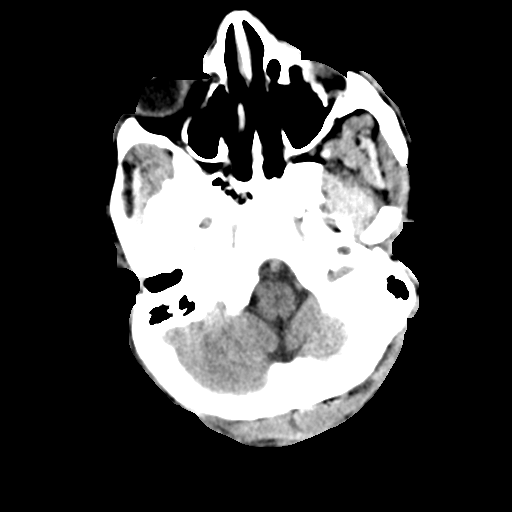
[im 5/33  bone]
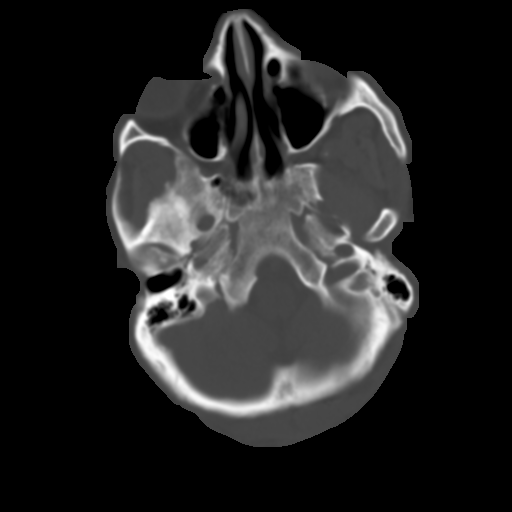
[im 9/33  brain]
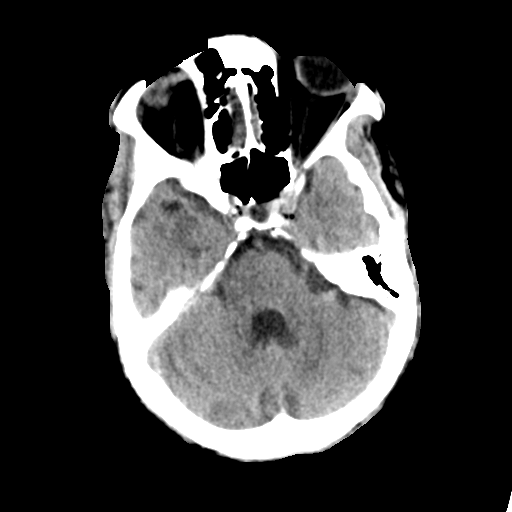
[im 13/33  brain]
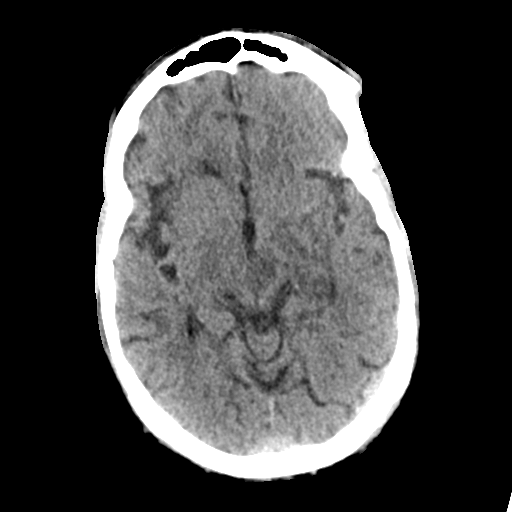
[im 17/33  brain]
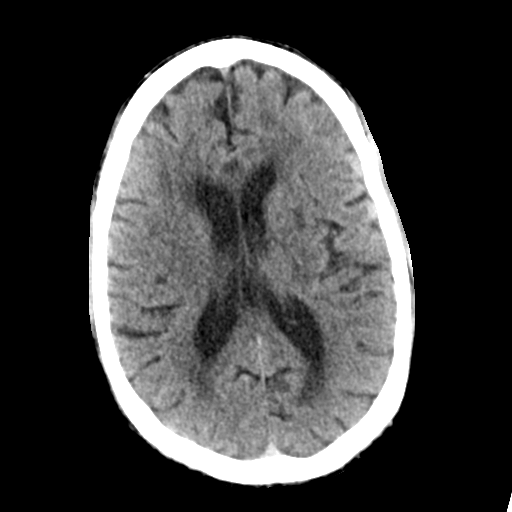
[im 21/33  brain]
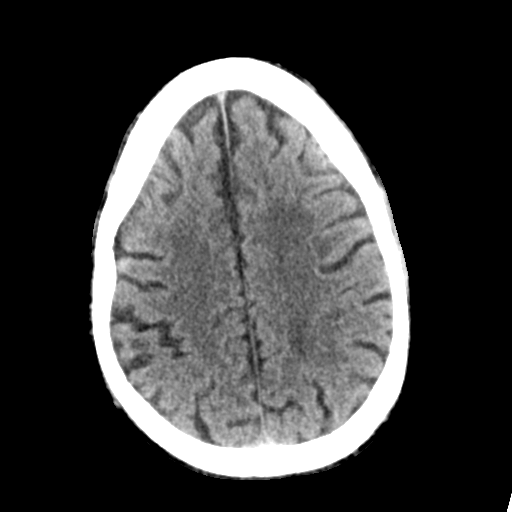
[im 21/33  bone]
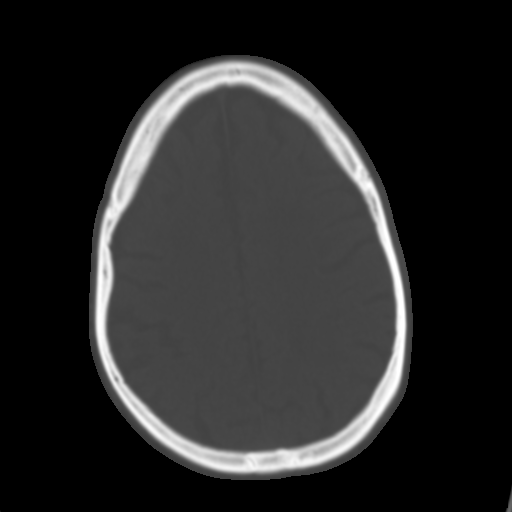
[im 25/33  brain]
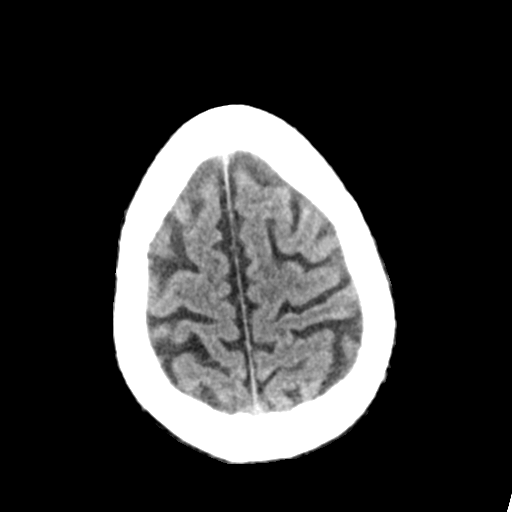
[im 29/33  brain]
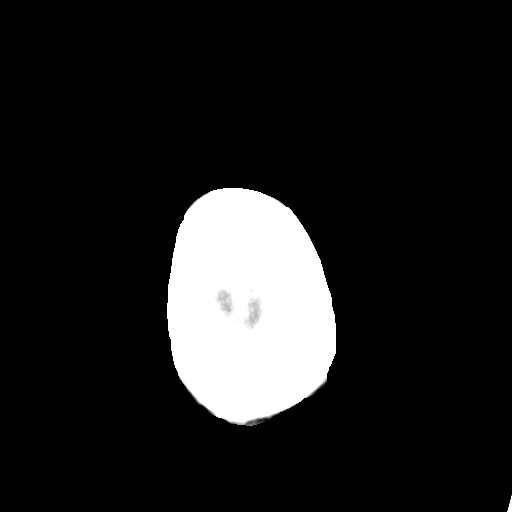

[Series 4: head bone · axial · 0.42mm/px · z∈[-106,-74]mm · 3 of 82 slices shown]
[im 9/82  bone]
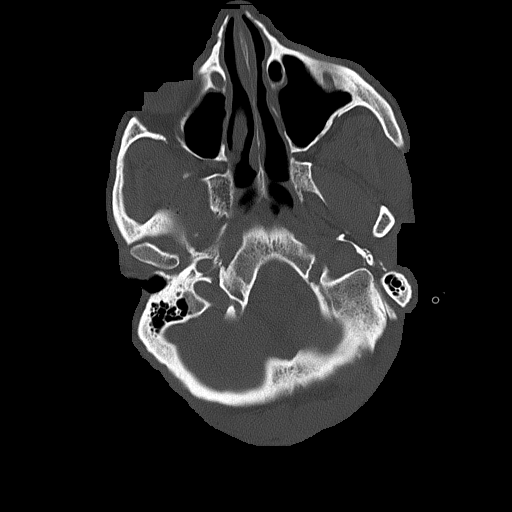
[im 17/82  bone]
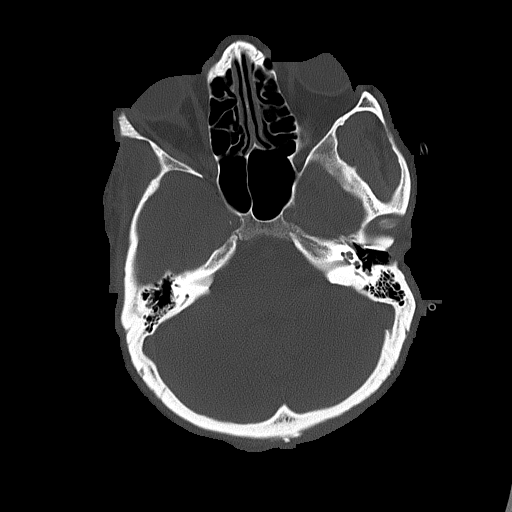
[im 25/82  bone]
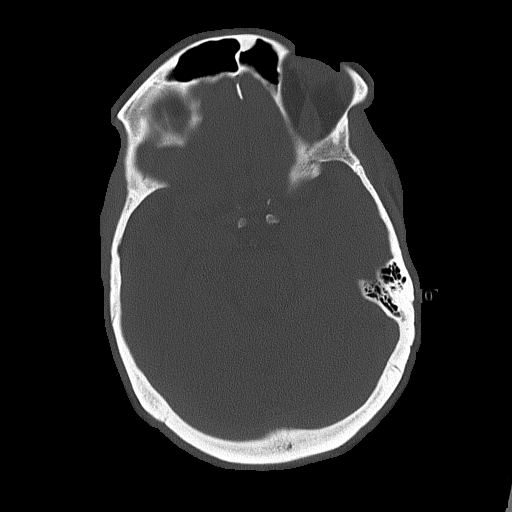

[Series 5: head without cor · coronal · non-contrast · 0.31mm/px · 3 of 68 slices shown]
[im 23/68  brain]
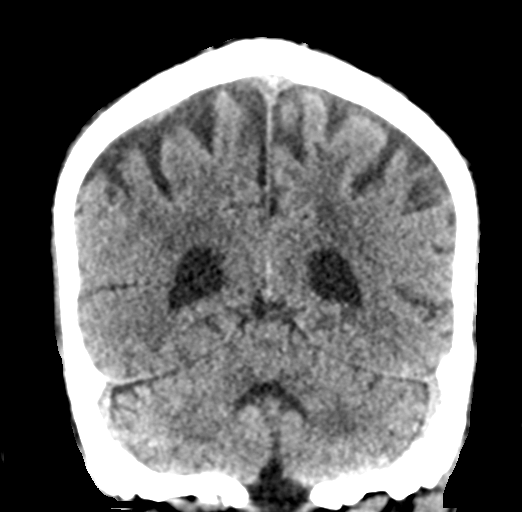
[im 30/68  brain]
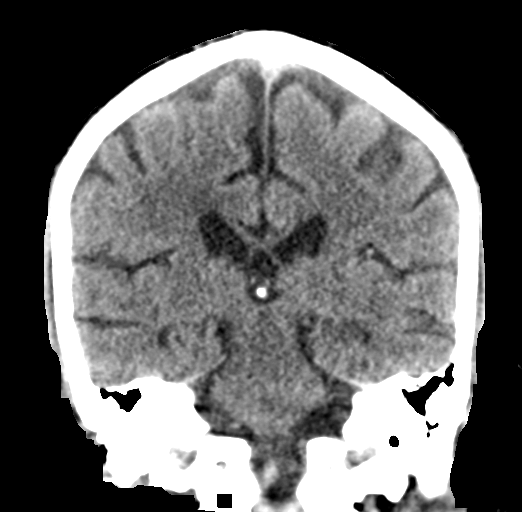
[im 38/68  brain]
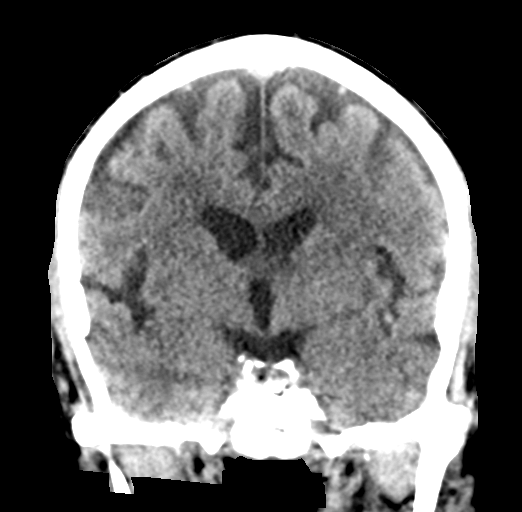

[Series 6: head without sag · sagittal · non-contrast · 0.31mm/px · 3 of 55 slices shown]
[im 19/55  brain]
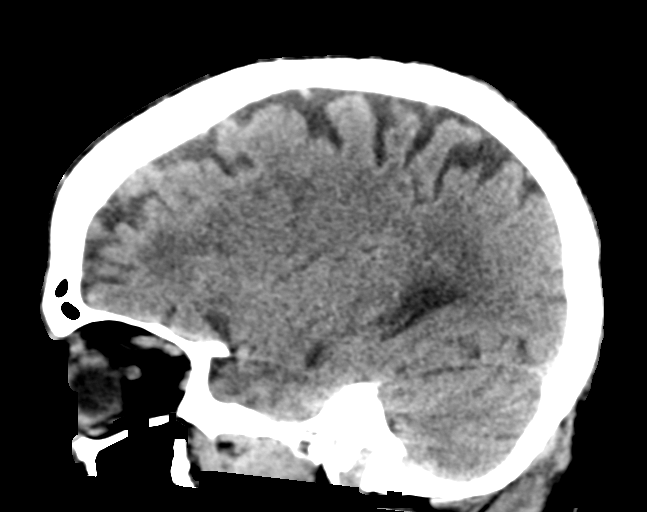
[im 28/55  brain]
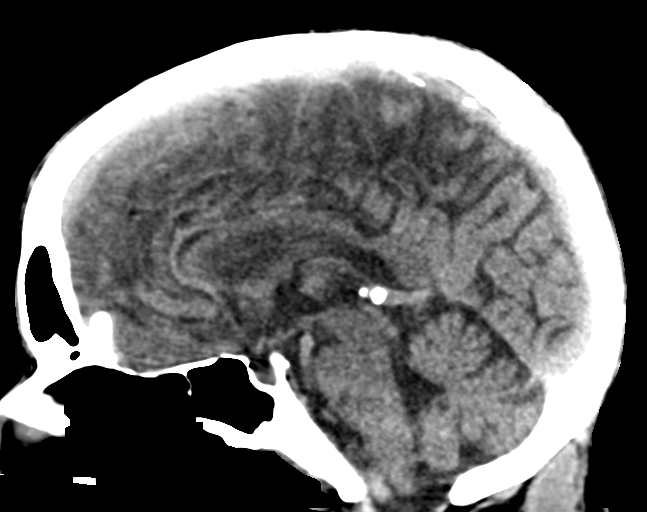
[im 37/55  brain]
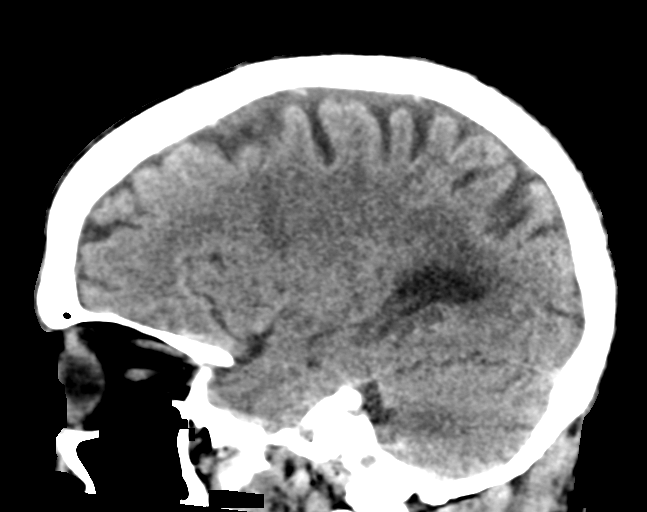

[16 of 47 positions shown; findings below may reference images not displayed]

FINDINGS: Brain: No evidence of acute intracranial hemorrhage or extra-axial
collection.Evolving scattered infarcts involving the supratentorial
and infratentorial brain, as described on recent MRI.No evidence of
new large territory infarct. No concerning mass effectconfluent
periventricular and subcortical white matter hypoattenuation, which
is nonspecific but likely sequela of chronic small vessel ischemic
disease.Mild cerebral atrophy

Vascular: No hyperdense vessel.

Skull: Negative for fracture.

Sinuses/Orbits: Minimal scattered mucosal thickening. The orbits are
unremarkable.

Other: None.
IMPRESSION: Evolving scattered infarcts in the supra and infratentorial brain.
No evidence of new large territory infarct or hemorrhagic
conversion.

Background of moderate chronic small vessel ischemic disease.

## 2021-10-17 IMAGING — MR MR HEAD W/O CM
10 of 11 series · 35 of 48 positions shown · non-contrast
Comparison: [DATE]

CLINICAL DATA: Neuro deficit, acute, stroke suspected

EXAM:
MRI HEAD WITHOUT CONTRAST
TECHNIQUE: Multiplanar, multiecho pulse sequences of the brain and surrounding
structures were obtained without intravenous contrast.

[Series 4: DWI · axial · 3.0mm · 1.09mm/px · z∈[-31,+121]mm · 8 of 106 slices shown (1 of 4)]
[im 1/106]
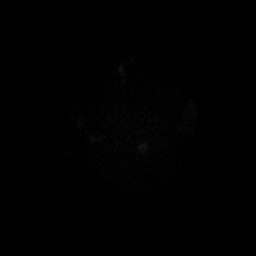
[im 12/106]
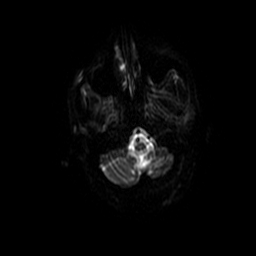
[im 36/106]
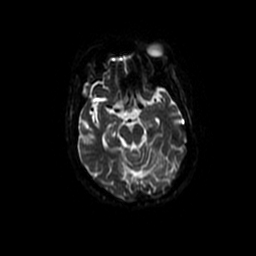
[im 47/106]
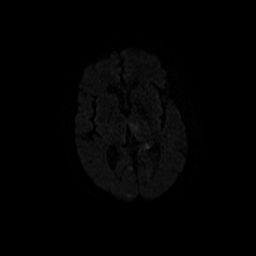
[im 59/106]
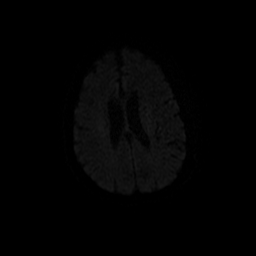
[im 71/106]
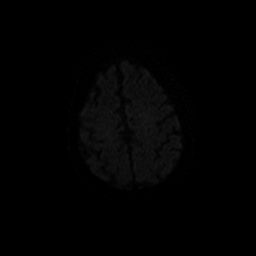
[im 94/106]
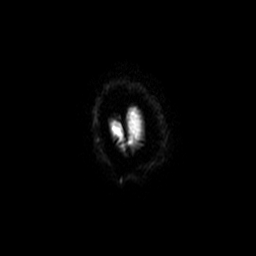
[im 106/106]
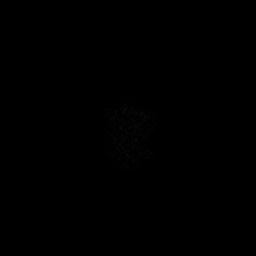

[Series 5: DWI · coronal · 5.0mm · 1.09mm/px · 7 of 76 slices shown (2 of 4)]
[im 1/76]
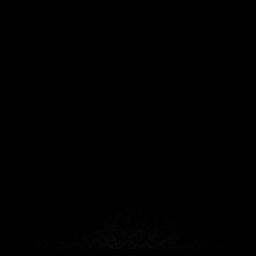
[im 13/76]
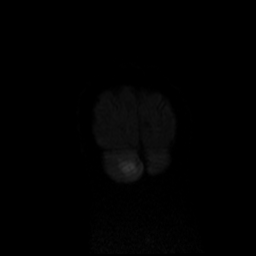
[im 26/76]
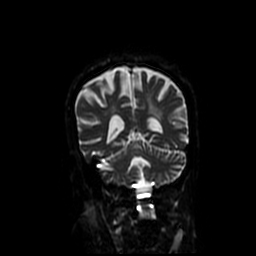
[im 38/76]
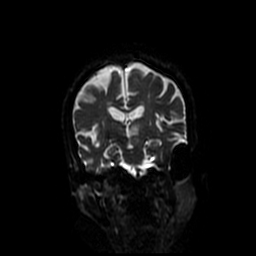
[im 51/76]
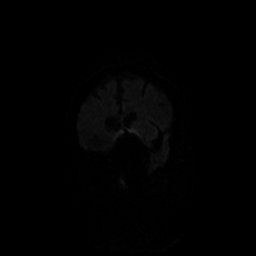
[im 63/76]
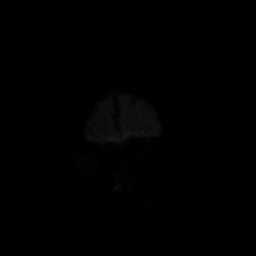
[im 76/76]
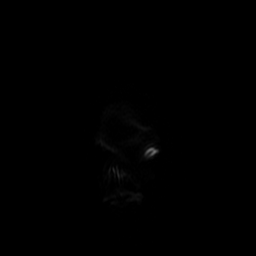

[Series 6: T1 · sagittal · 5.0mm · 0.47mm/px · 2 of 25 slices shown (1 of 2)]
[im 1/25]
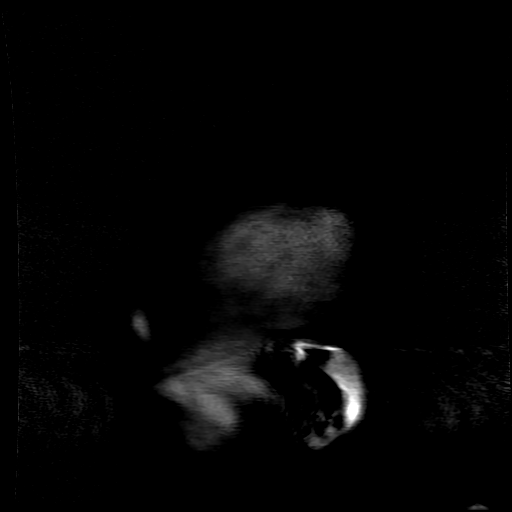
[im 25/25]
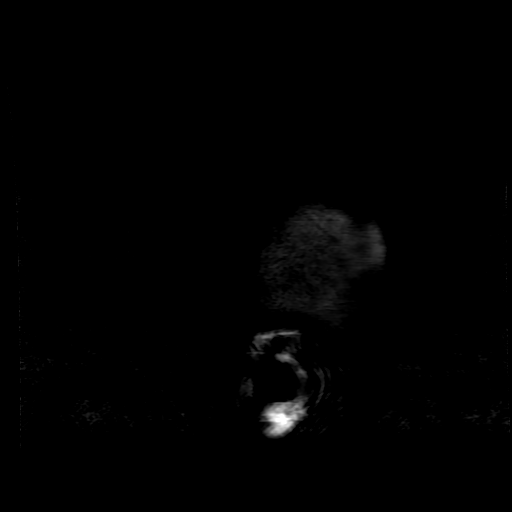

[Series 7: T2 · axial · 5.0mm · 0.43mm/px · z∈[-48,+106]mm · 2 of 27 slices shown (1 of 2)]
[im 1/27]
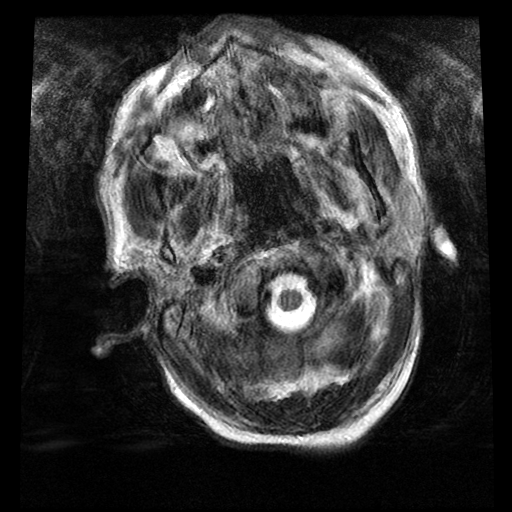
[im 27/27]
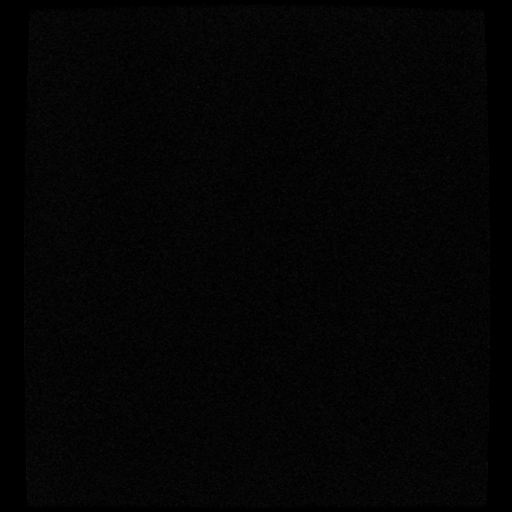

[Series 8: FLAIR · axial · 3.0mm · 0.43mm/px · z∈[-43,+105]mm · 2 of 26 slices shown (1 of 2)]
[im 1/26]
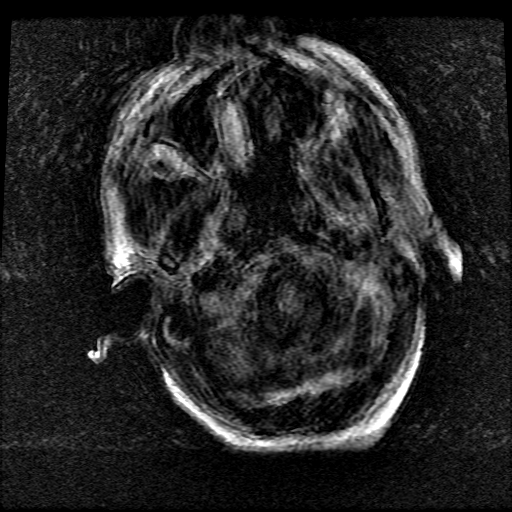
[im 26/26]
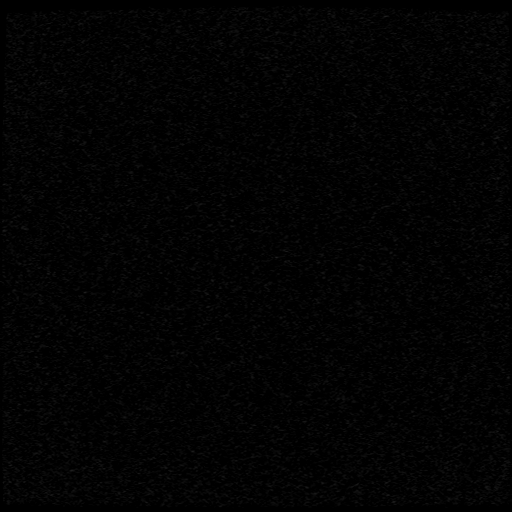

[Series 10: FLAIR · axial · 3.0mm · 0.43mm/px · z∈[-43,+105]mm · 2 of 26 slices shown (2 of 2)]
[im 1/26]
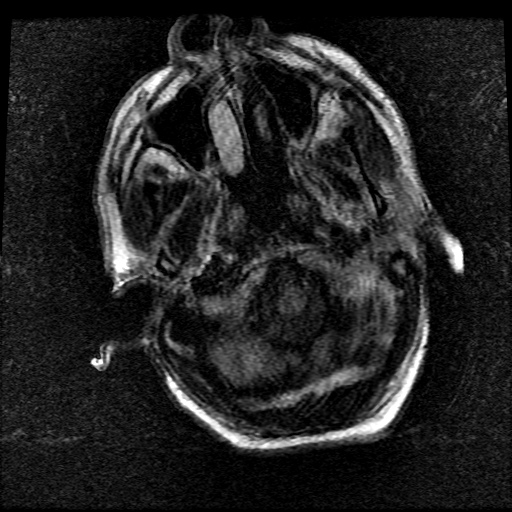
[im 26/26]
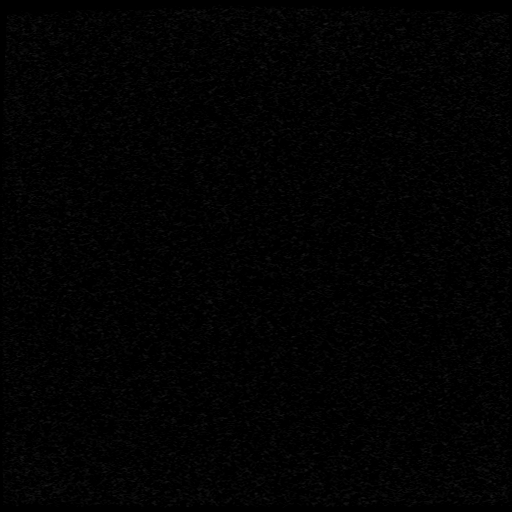

[Series 11: T1 · axial · 3.0mm · 0.47mm/px · 1 of 108 slices shown (2 of 2)]
[im 1/108]
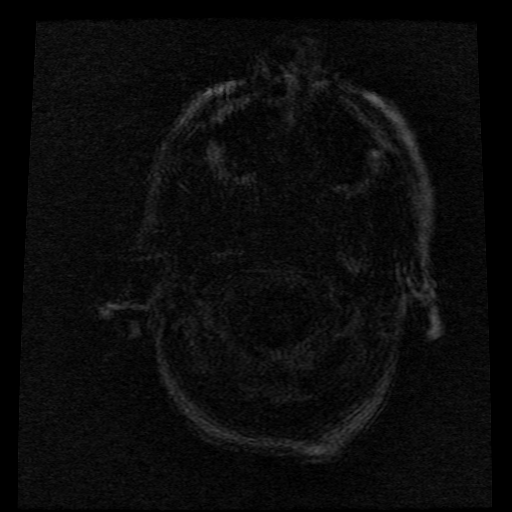

[Series 12: T2 · coronal · 5.0mm · 0.39mm/px · 3 of 29 slices shown (2 of 2)]
[im 1/29]
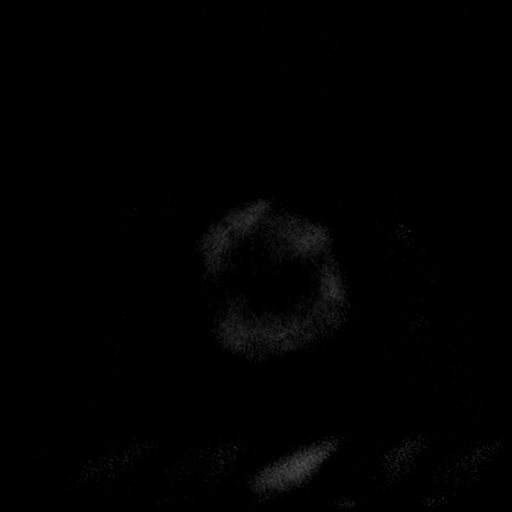
[im 15/29]
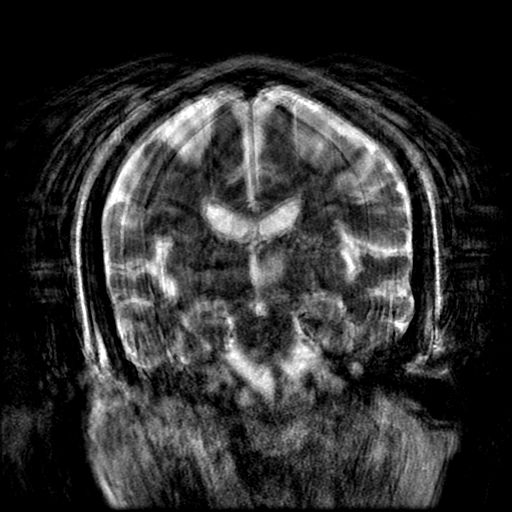
[im 29/29]
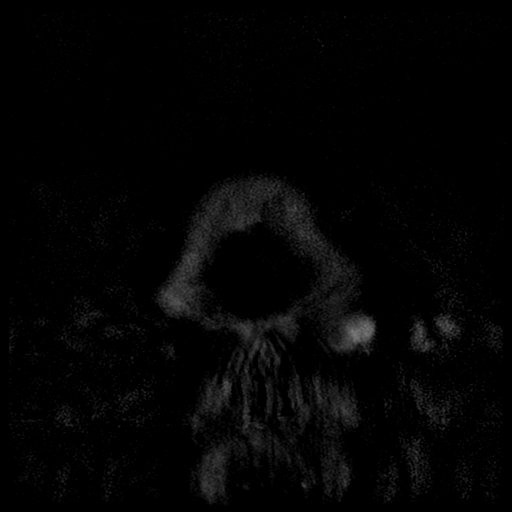

[Series 400: DWI · axial · 3.0mm · 1.09mm/px · z∈[-31,+121]mm · 5 of 53 slices shown (3 of 4)]
[im 1/53]
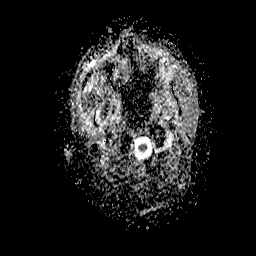
[im 14/53]
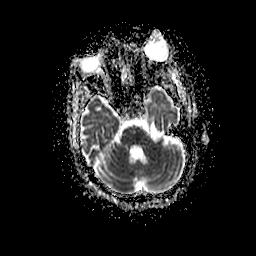
[im 27/53]
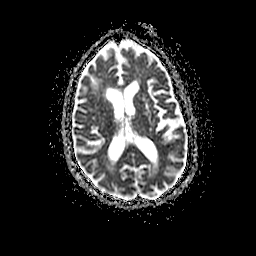
[im 40/53]
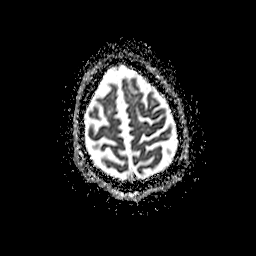
[im 53/53]
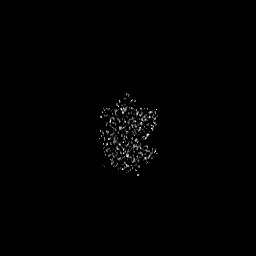

[Series 500: DWI · coronal · 5.0mm · 1.09mm/px · 3 of 38 slices shown (4 of 4)]
[im 1/38]
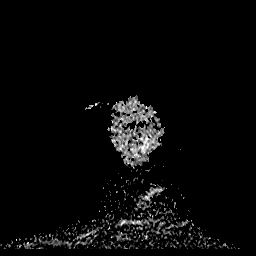
[im 19/38]
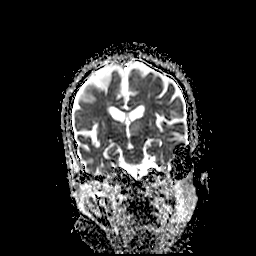
[im 38/38]
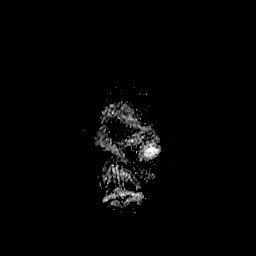

[35 of 48 positions shown; findings below may reference images not displayed]

FINDINGS: Motion artifact is present.

Brain: Areas of diffusion hyperintensity are again identified for
example involving the posterior right cerebellum, left hippocampus,
right occipital lobe, left thalamus, left cerebral peduncle and left
precentral gyrus. These correspond to evolving recent infarcts seen
on the prior MRI. However, several of these appear to have
superimposed more acute ischemic change given appearance on DWI and
ADC as well as extent of involvement. Possible new punctate foci in
the right thalamocapsular region.

No mass effect. Stable findings of chronic microvascular ischemic
changes as well as scattered small chronic infarcts. Foci of chronic
microhemorrhage in the right thalamus. Ventricles are stable in
size.

Vascular: Major vessel flow voids at the skull base are preserved.

Skull and upper cervical spine: Marrow signal appears within normal
limits.

Sinuses/Orbits: Paranasal sinuses are aerated. Orbits are
unremarkable.

Other: Sella is unremarkable.  Mastoid air cells are clear.
IMPRESSION: Significantly motion degraded.

Evolving recent infarcts seen on prior MRI, some of which
demonstrate superimposed likely more acute ischemia. Possible new
acute or subacute punctate right thalamocapsular infarcts.

## 2021-10-17 IMAGING — DX DG CHEST 1V PORT
1 series · 1 of 1 positions shown · non-contrast
Comparison: [DATE]

CLINICAL DATA: Desaturation

EXAM:
PORTABLE CHEST 1 VIEW

[chest]
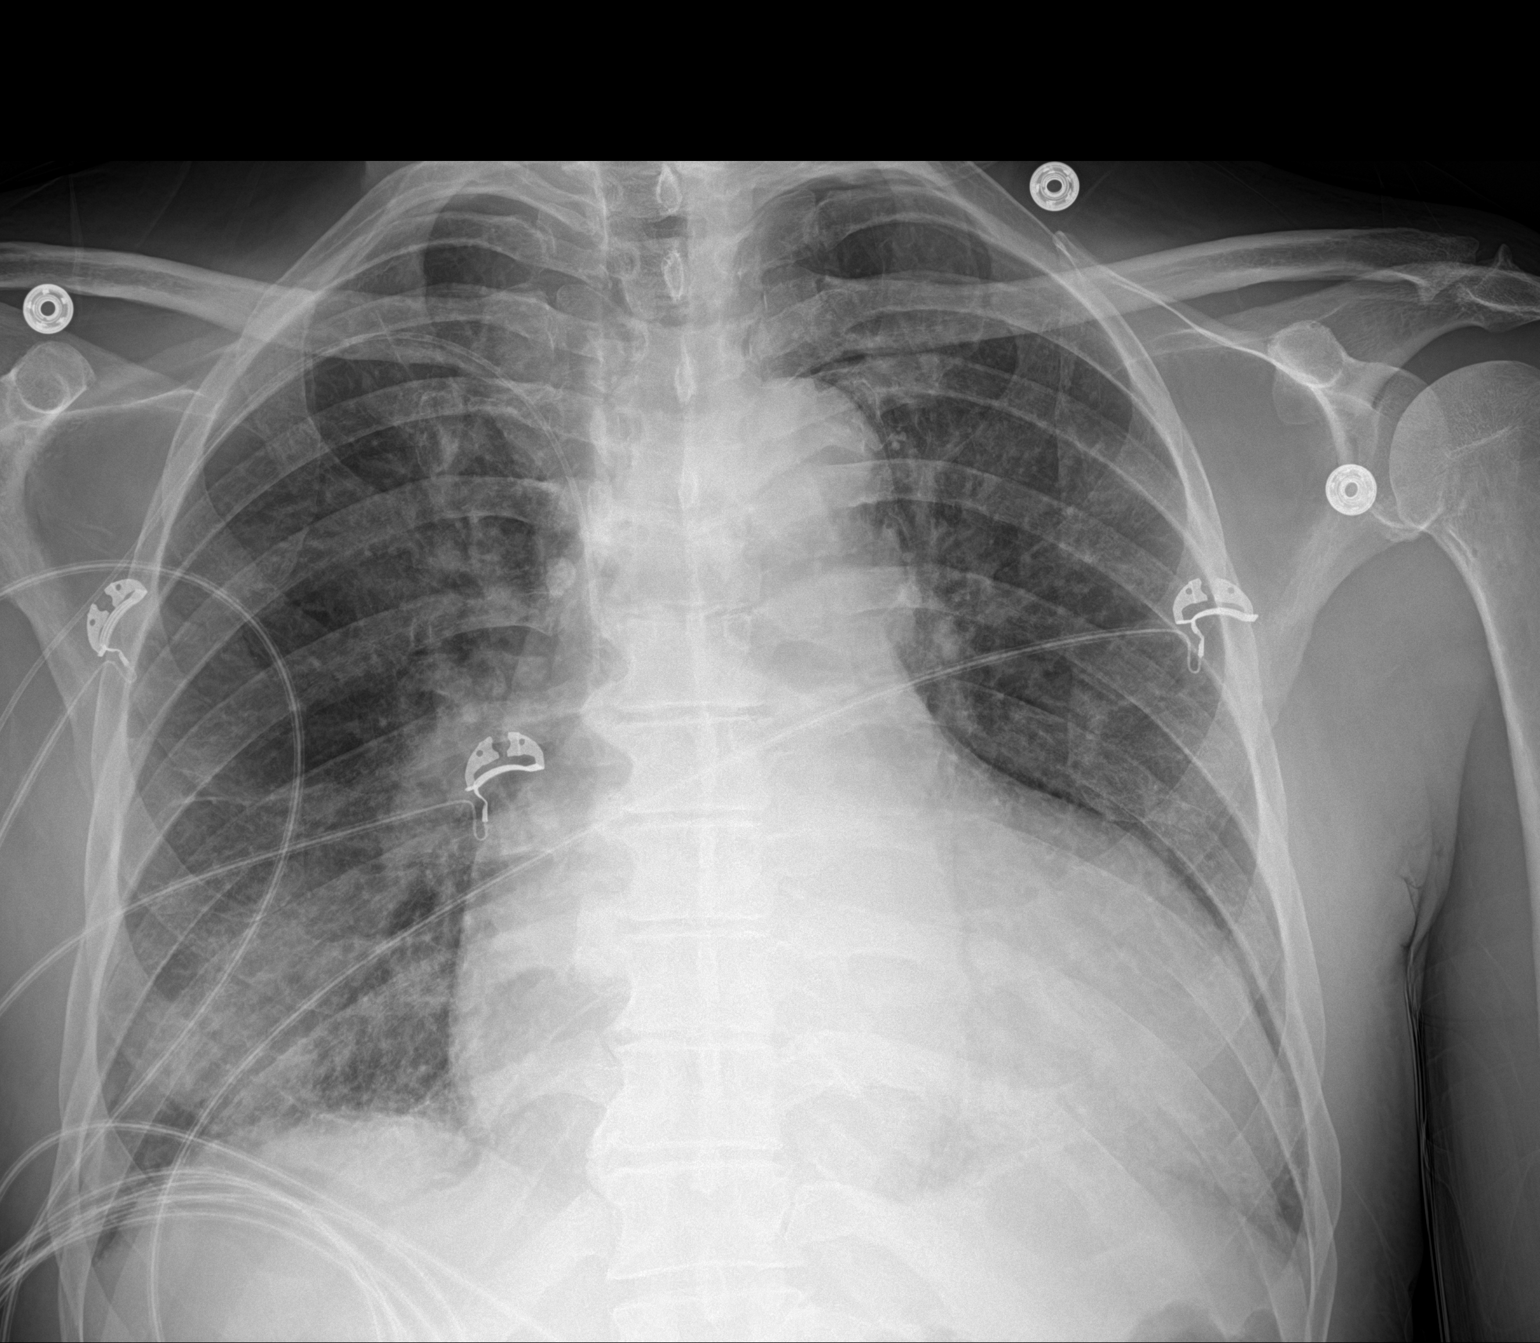

[1 of 1 positions shown; findings below may reference images not displayed]

FINDINGS: Interval placement of right upper extremity PICC, tip positioned
over the superior vena cava. Gross cardiomegaly. Mild, diffuse
interstitial pulmonary opacity, unchanged. No new or focal airspace
opacity.
IMPRESSION: 1. Interval placement of right upper extremity PICC, tip positioned
over the superior vena cava.
2. Gross cardiomegaly. Mild, diffuse interstitial pulmonary opacity,
unchanged, most likely edema.

## 2021-10-17 MED ORDER — AMIODARONE HCL IN DEXTROSE 360-4.14 MG/200ML-% IV SOLN
INTRAVENOUS | Status: AC
  Administered 2021-10-17: 60 mg/h via INTRAVENOUS
  Filled 2021-10-17: qty 200

## 2021-10-17 MED ORDER — AMIODARONE LOAD VIA INFUSION
150.0000 mg | Freq: Once | INTRAVENOUS | Status: AC
  Administered 2021-10-17: 150 mg via INTRAVENOUS
  Filled 2021-10-17: qty 83.34

## 2021-10-17 MED ORDER — HYDRALAZINE HCL 20 MG/ML IJ SOLN
10.0000 mg | Freq: Once | INTRAMUSCULAR | Status: AC
  Administered 2021-10-17: 10 mg via INTRAVENOUS
  Filled 2021-10-17: qty 1

## 2021-10-17 MED ORDER — HYDRALAZINE HCL 50 MG PO TABS
50.0000 mg | ORAL_TABLET | Freq: Three times a day (TID) | ORAL | Status: DC
  Administered 2021-10-17 – 2021-10-18 (×2): 50 mg via ORAL
  Filled 2021-10-17 (×2): qty 1

## 2021-10-17 MED ORDER — AMIODARONE HCL IN DEXTROSE 360-4.14 MG/200ML-% IV SOLN
60.0000 mg/h | INTRAVENOUS | Status: AC
  Filled 2021-10-17: qty 200

## 2021-10-17 MED ORDER — AMIODARONE HCL IN DEXTROSE 360-4.14 MG/200ML-% IV SOLN
30.0000 mg/h | INTRAVENOUS | Status: DC
  Administered 2021-10-17 – 2021-10-18 (×2): 30 mg/h via INTRAVENOUS
  Filled 2021-10-17: qty 200

## 2021-10-17 NOTE — Progress Notes (Signed)
PT Cancellation Note  Patient Details Name: Oscar Rosales MRN: [id number] DOB: 1966-02-15   Cancelled Treatment:    Reason Eval/Treat Not Completed: Patient not medically ready Cancel per RN as concern pt had another CVA. Will follow.   Blake Divine A Remi Rester 10/17/2021, 10:58 AM [place], PT, DPT Acute Rehabilitation Services Pager [phone] Office [phone]

## 2021-10-17 NOTE — Procedures (Signed)
Patient Name: Oscar Rosales  MRN: [id number]  Epilepsy Attending: Charlsie Quest  Referring Physician/Provider: Marolyn Haller, MD Date: 10/17/2021 Duration: 23.19 mins  Patient history: 56 year old male with embolic infarcts on MRI today with worsening mental status and left upper extremity, left lower extremity twitching.  EEG to evaluate for seizure.  Level of alertness: Awake, drowsy  AEDs during EEG study: None  Technical aspects: This EEG study was done with scalp electrodes positioned according to the 10-20 International system of electrode placement. Electrical activity was acquired at a sampling rate of 500Hz  and reviewed with a high frequency filter of 70Hz  and a low frequency filter of 1Hz . EEG data were recorded continuously and digitally stored.   Description: The posterior dominant rhythm consists of 8 Hz activity of moderate voltage (25-35 uV) seen predominantly in posterior head regions, symmetric and reactive to eye opening and eye closing. Drowsiness was characterized by attenuation of the posterior background rhythm.  EEG showed intermittent generalized 5 to 6 Hz theta slowing hyperventilation and photic stimulation were not performed.     ABNORMALITY - Intermittent slow, generalized  IMPRESSION: This study is suggestive of mild diffuse encephalopathy, nonspecific etiology. No seizures or epileptiform discharges were seen throughout the recording.  Oscar Rosales 

## 2021-10-17 NOTE — Progress Notes (Signed)
OT Cancellation Note  Patient Details Name: Oscar Rosales MRN: [id number] DOB: May 14, 1966   Cancelled Treatment:    Reason Eval/Treat Not Completed: Medical issues which prohibited therapy. Patient not medically ready Hold per RN as concern pt had another CVA. Will follow as schedule allows and medically stable. Thank you.   Oscar Rosales Oscar Rosales MSOT, OTR/L Acute Rehab Pager: [phone] Office: [phone] 10/17/2021, 11:03 AM

## 2021-10-17 NOTE — Progress Notes (Signed)
EEG completed, results pending. 

## 2021-10-17 NOTE — Progress Notes (Signed)
ANTICOAGULATION CONSULT NOTE - Follow Up Consult  Pharmacy Consult for Heparin Indication: pulmonary embolus and stroke  Allergies  Allergen Reactions   Lisinopril Cough    SWITCHED TO LOSARTAN    Patient Measurements: Height: 5\' 11"  (180.3 cm) Weight: 63.1 kg (139 lb 1.8 oz) IBW/kg (Calculated) : 75.3 Heparin Dosing Weight: 77.1 kg   Vital Signs: Temp: 97.8 F (36.6 C) (02/02 1100) Temp Source: Oral (02/02 1100) BP: 173/118 (02/02 1439) Pulse Rate: 106 (02/02 1100)  Labs: Recent Labs    10/15/21 0323 10/15/21 1716 10/16/21 0500 10/17/21 0550  HGB 13.6  --  14.0 14.5  HCT 41.3  --  41.3 43.3  PLT 141*  --  137* 152  HEPARINUNFRC 0.31  --  0.43 0.44  CREATININE 2.03* 1.97* 2.05* 2.03*     Estimated Creatinine Clearance: 36.3 mL/min (A) (by C-G formula based on SCr of 2.03 mg/dL (H)).   Medications:  Scheduled:   Chlorhexidine Gluconate Cloth  6 each Topical Daily   furosemide  80 mg Intravenous BID   hydrALAZINE  37.5 mg Oral Q8H   rosuvastatin  20 mg Oral Daily   sodium chloride flush  10-40 mL Intracatheter Q12H   Infusions:   sodium chloride     amiodarone 60 mg/hr (10/17/21 1337)   Followed by   amiodarone     heparin 1,200 Units/hr (10/17/21 0640)   milrinone 0.25 mcg/kg/min (10/17/21 0230)    Assessment: 56 yo M continues on IV heparin for new PE on admit and scattered embolic infarcts.    Heparin level 0.4 continues to be at  goal on 1200 units/hr. No bleeding noted. Hgb stable 14. Plt count stable.   Will target lower end of heparin goal in the setting of acute infarcts as well.  Apixaban copay check ($47) done yesterday.   Goal of Therapy:  Heparin level 0.3-0.5 units/ml Monitor platelets by anticoagulation protocol: Yes   Plan:  Continue heparin at 1200 units/hr Heparin level and CBC with AM labs. Follow-up plans for oral anticoagulation. Monitor for s/sx bleeding.    58 Pharm.D. CPP, BCPS Clinical  Pharmacist [phone] 10/17/2021 3:01 PM

## 2021-10-17 NOTE — Plan of Care (Signed)
  Problem: Ischemic Stroke/TIA Tissue Perfusion: Goal: Complications of ischemic stroke/TIA will be minimized Outcome: Progressing   

## 2021-10-17 NOTE — Progress Notes (Signed)
Called for ABG d/t rapid response in progress (see MD notes).  Per attending MD, no bipap needed unless ABG shows respiratory acidosis.  ABG results were reported to cardiology and RR RN, & per discussion- BIPAP is not indicated at this time.  RR RN and bedside RN at the bedside, VSS when I last saw pt.

## 2021-10-17 NOTE — Significant Event (Signed)
Rapid Response Event Note   Reason for Call :  Labored breathing RR 40s  Initial Focused Assessment:  Patient is lying in the bed.  He is alert and able to answer question.  He is disoriented to place and time.  When asked he states that he is having trouble breathing.    His respiratory pattern cycles with a period  of apnea followed by rapid breathing.  RR 0  then 45.  He does have some increased work of breathing.  Lung sounds clear, scattered crackles.  On 2.5L [place]  O2 sat 90-93%  BP 179/124  Hr 110-120  frequent PVCs occasional run VT CVP 11 Milrinone gtt infusing  He has periods of 20-30 secs of Left shoulder and foot twitching.   Resident team and Heart failure team at bedside to assess patient.     Interventions:  AM dose of 80 mg Lasix given IV Amiodarone bolus and gtt started 10 mg Hydralazine given IV  ABG done  7.54/28.2/74.9/24 PCXR  Plan EEG   Plan of Care:     Event Summary:   MD Notified:  Resident team and Heart failure team Call Time: 0748 Arrival Time: 0755 End Time: 0930  Marcellina Millin, RN

## 2021-10-17 NOTE — Progress Notes (Signed)
Patient has been resting comfortable since shortly 1100. Awoke when Clinical research associate in room and wife tried to wake. Attempted to talk to wife. He has no problem saying one word, but when saying sentences now, consistently words are running together and not understandable.   Internal medicine teaching service paged, and came to bedside. They did no notice difference from this a.m. Wife says slightly better now and now seems to happen toward the end of sentences when patient seems to be overexerting to process speech.   Continue to monitor

## 2021-10-17 NOTE — Progress Notes (Addendum)
Advanced Heart Failure Rounding Note  PCP-Cardiologist: None   Subjective:    1/30: DBA started for low output, initial Co-ox 44%  2/1 DBA stopped and switched to milrinone due to hypertension  On Milrinone 0.25 mcg.CO-OX 59%   Earlier this morning Rapid called due to cheyne/stokes respiratory patten  and AMS.   Drowsy.  Objective:   Weight Range: 63.1 kg Body mass index is 19.4 kg/m.   Vital Signs:   Temp:  [97.5 F (36.4 C)-98.4 F (36.9 C)] 97.8 F (36.6 C) (02/02 0747) Pulse Rate:  [100-116] 116 (02/02 0700) Resp:  [17-30] 30 (02/02 0700) BP: (154-179)/(110-136) 161/136 (02/02 0700) SpO2:  [90 %-95 %] 91 % (02/02 0700) Weight:  [63.1 kg] 63.1 kg (02/02 0500) Last BM Date: 10/16/21  Weight change: Filed Weights   10/15/21 0422 10/16/21 0323 10/17/21 0500  Weight: 66.2 kg 64.8 kg 63.1 kg    Intake/Output:   Intake/Output Summary (Last 24 hours) at 10/17/2021 0823 Last data filed at 10/17/2021 0230 Gross per 24 hour  Intake 645.59 ml  Output 650 ml  Net -4.41 ml      Physical Exam  CVP 11-12  General:  Cheyne Stokes respiratory pattern.  HEENT: normal Neck: supple. JVP 11-12 . Carotids 2+ bilat; no bruits. No lymphadenopathy or thryomegaly appreciated. Cor: PMI nondisplaced. Tachy regular rate & rhythm. No rubs, gallops or murmurs. Lungs: Crackles in bases  Abdomen: soft, nontender, nondistended. No hepatosplenomegaly. No bruits or masses. Good bowel sounds. Extremities: no cyanosis, clubbing, rash, edema. RUE PICC  Neuro: Oriented to self , cranial nerves grossly intact. moves all 4 extremities w/o difficulty. Affect flat . Twitching LUE LLE  Telemetry  ST with frequent PVCS/NSVT. Increase PVC burden > 700 am   EKG   ST with PVCs 124 bpm  Labs    CBC Recent Labs    10/16/21 0500 10/17/21 0550  WBC 7.1 10.2  HGB 14.0 14.5  HCT 41.3 43.3  MCV 96.3 95.6  PLT 137* 0000000   Basic Metabolic Panel Recent Labs    10/15/21 1716 10/16/21 0500  10/17/21 0550  NA 139 139 141  K 3.9 3.9 3.5  CL 104 102 103  CO2 27 26 26   GLUCOSE 110* 112* 108*  BUN 40* 39* 38*  CREATININE 1.97* 2.05* 2.03*  CALCIUM 8.2* 8.5* 8.7*  MG 1.9  --  2.0   Liver Function Tests No results for input(s): AST, ALT, ALKPHOS, BILITOT, PROT, ALBUMIN in the last 72 hours. No results for input(s): LIPASE, AMYLASE in the last 72 hours. Cardiac Enzymes No results for input(s): CKTOTAL, CKMB, CKMBINDEX, TROPONINI in the last 72 hours.  BNP: BNP (last 3 results) Recent Labs    10/11/21 1245  BNP 3,942.1*    ProBNP (last 3 results) No results for input(s): PROBNP in the last 8760 hours.   D-Dimer No results for input(s): DDIMER in the last 72 hours. Hemoglobin A1C No results for input(s): HGBA1C in the last 72 hours. Fasting Lipid Panel No results for input(s): CHOL, HDL, LDLCALC, TRIG, CHOLHDL, LDLDIRECT in the last 72 hours. Thyroid Function Tests No results for input(s): TSH, T4TOTAL, T3FREE, THYROIDAB in the last 72 hours.  Invalid input(s): FREET3  Other results:   Imaging    No results found.   Medications:     Scheduled Medications:  Chlorhexidine Gluconate Cloth  6 each Topical Daily   furosemide  80 mg Intravenous BID   hydrALAZINE  37.5 mg Oral Q8H   rosuvastatin  20 mg Oral Daily   sodium chloride flush  10-40 mL Intracatheter Q12H    Infusions:  sodium chloride     heparin 1,200 Units/hr (10/17/21 0640)   milrinone 0.25 mcg/kg/min (10/17/21 0230)    PRN Medications: acetaminophen **OR** acetaminophen (TYLENOL) oral liquid 160 mg/5 mL **OR** acetaminophen, guaiFENesin-dextromethorphan, senna-docusate, sodium chloride flush    Patient Profile   56 y.o. male with hx chronic systolic CHF/NICM, HTN, CKD IIIb, hx hepatitis C infection, noncompliance with medical therapy. Admitted with CVA, PE and acute on chronic systolic CHF w/ low output. Has been followed at [facility].  Echo with biventricular  hypertrophy/dysfunction EF 15-20% Moderate MR moderate RV dysfunction.  Assessment/Plan   Acute on chronic systolic CHF/NICM: -Diagnosed 2018. Normal coronaries on LHC in 2019. Felt to be due to uncontrolled hypertension. -EF previously recovered to 45-50%.  -EF down to 25-30% in 06/21 in setting of uncontrolled HTN.  -Echo 01/23: EF 15-20%, RV moderately reduced, severe biatrial enlargement, moderate MR, moderate TR, moderate AI -Volume overloaded w/ low output, initial co-ox 44%.  Remains hypertensive. Stop DBA and switched to milrinone 0.25 mcg. - CO -OX 59%.  - CVP 11. CXR pulmonary edema versus developing infiltrate - Continue IV lasix.    -No beta blocker with a/c CHF -Now off losartan with AKI on CKD -Eventual SGLT2i. A1c 5.2% -Continue hydralazine 37.5 mg three times a day.   -May need RHC depending on coarse -Likely not candidate for advanced therapies currently d/t noncompliance and renal impairment.   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 Scattered infarcts on MRI brain -No large vessel occlusion on CTA head and neck -Negative bubble study on echo -Neurology called today for AMS and LUE /LLE twitching.    3. PE: -Incidentally noted on CTA chest -On heparin gtt -LE dopplers negative for DVT -Hypercoagulable labs pending   4. AKI on CKD IIIb: -Baseline Scr 1.7 -SCr peaked to 2.22 -->today 2.05-->2.03  -Renal US c/w medical renal disease. -suspect cardiorenal from low output HF  5. PVCs /NSVT Increased frequency this morning. Started amio drip to suppress PVC burned.   6. HTN Continue current regimen and given IV hydralazine x1.    Tenuous. Concerned about trajectory. Needs Palliative Consult for [place].     Length of Stay: [place], NP  10/17/2021, 8:23 AM  Advanced Heart Failure Team Pager [phone] (M-F; 7a - 5p)  Please contact [facility] for night-coverage after hours (5p -7a ) and weekends on [email or web address]  Agree with above.   Rapid response called this am.  He developed acute pulmonary edema in setting of HTN crisis. SBP 170-180 range. CXR with diffuse edema  General:  sitting up in bed. Tachypneic HEENT: normal Neck: supple.JVP to jaw. Carotids 2+ bilat; no bruits. No lymphadenopathy or thryomegaly appreciated. Cor: PMI nondisplaced. Tachy regular  No rubs, gallops or murmurs. Lungs: + crackles  Abdomen: soft, nontender, nondistended. No hepatosplenomegaly. No bruits or masses. Good bowel sounds. Extremities: no cyanosis, clubbing, rash, edema Neuro: alert & orientedx3, cranial nerves grossly intact. Weak on R  He has acute pulmonary edema in setting of HTN crisis. Will give IV lasix and IV hydralazine. Increase po hydral. Attempt to keep SBP 125-140. Can consider careful trial of Entresto but need to follow renal function closely.   CRITICAL CARE Performed by: Glori Bickers  Total critical care time: 35 minutes  Critical care time was exclusive of separately billable procedures and treating other patients.  Critical care was necessary to treat or prevent imminent  or life-threatening deterioration.  Critical care was time spent personally by me (independent of midlevel providers or residents) on the following activities: development of treatment plan with patient and/or surrogate as well as nursing, discussions with consultants, evaluation of patient's response to treatment, examination of patient, obtaining history from patient or surrogate, ordering and performing treatments and interventions, ordering and review of laboratory studies, ordering and review of radiographic studies, pulse oximetry and re-evaluation of patient's condition.  Glori Bickers, MD  9:46 AM

## 2021-10-17 NOTE — Progress Notes (Signed)
STROKE TEAM PROGRESS NOTE   INTERVAL HISTORY Stroke team had signed off but will call back to evaluate him as he was having twitchings all over.  Review of lab work showed that these getting slightly uremic with creatinine of 2.0 and also Novantrone for his A-fib and there is no recent blood check.  Labs reveal negative lupus anticoagulant with positive ANA with low titer antibodies for scleroderma and dsDNA.  Antithrombin III, protein C&S activity and total normal.  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 Vitals:   10/17/21 0800 10/17/21 0900 10/17/21 1000 10/17/21 1100  BP: (!) 179/124 (!) 174/133 (!) 160/113 (!) 152/126  Pulse: (!) 123 (!) 123 (!) 104 (!) 106  Resp: (!) 42 (!) 36 15 (!) 40  Temp:    97.8 F (36.6 C)  TempSrc:    Oral  SpO2: 94% 95% 95% 96%  Weight:      Height:       CBC:  Recent Labs  Lab 10/16/21 0500 10/17/21 0550  WBC 7.1 10.2  HGB 14.0 14.5  HCT 41.3 43.3  MCV 96.3 95.6  PLT 137* 0000000   Basic Metabolic Panel:  Recent Labs  Lab 10/15/21 1716 10/16/21 0500 10/17/21 0550  NA 139 139 141  K 3.9 3.9 3.5  CL 104 102 103  CO2 27 26 26   GLUCOSE 110* 112* 108*  BUN 40* 39* 38*  CREATININE 1.97* 2.05* 2.03*  CALCIUM 8.2* 8.5* 8.7*  MG 1.9  --  2.0   Lipid Panel:  Recent Labs  Lab 10/12/21 0117  CHOL 167  TRIG 40  HDL 45  CHOLHDL 3.7  VLDL 8  LDLCALC 114*   HgbA1c:  Recent Labs  Lab 10/11/21 2256  HGBA1C 5.2   Urine Drug Screen:  Recent Labs  Lab 10/11/21 1358  LABOPIA NONE DETECTED  COCAINSCRNUR NONE DETECTED  LABBENZ NONE DETECTED  AMPHETMU NONE DETECTED  THCU NONE DETECTED  LABBARB NONE DETECTED    Alcohol Level  Recent Labs  Lab 10/11/21 1330  ETH <10    IMAGING past 24 hours CT HEAD WO CONTRAST (5MM)  Result Date: 10/17/2021 CLINICAL DATA:  Insert is EXAM: CT HEAD WITHOUT CONTRAST TECHNIQUE: Contiguous axial images were obtained from the base of the skull through the vertex without intravenous  contrast. RADIATION DOSE REDUCTION: This exam was performed according to the departmental dose-optimization program which includes automated exposure control, adjustment of the mA and/or kV according to patient size and/or use of iterative reconstruction technique. COMPARISON:  MRI 10/11/2021 FINDINGS: Brain: No evidence of acute intracranial hemorrhage or extra-axial collection.Evolving scattered infarcts involving the supratentorial and infratentorial brain, as described on recent MRI.No evidence of new large territory infarct. No concerning mass effectconfluent periventricular and subcortical white matter hypoattenuation, which is nonspecific but likely sequela of chronic small vessel ischemic disease.Mild cerebral atrophy Vascular: No hyperdense vessel. Skull: Negative for fracture. Sinuses/Orbits: Minimal scattered mucosal thickening. The orbits are unremarkable. Other: None. IMPRESSION: Evolving scattered infarcts in the supra and infratentorial brain. No evidence of new large territory infarct or hemorrhagic conversion. Background of moderate chronic small vessel ischemic disease. Electronically Signed   By: Maurine Simmering M.D.   On: 10/17/2021 12:17   DG CHEST PORT 1 VIEW  Result Date: 10/17/2021 CLINICAL DATA:  Desaturation EXAM: PORTABLE CHEST 1 VIEW COMPARISON:  10/11/2021 FINDINGS: Interval placement of right upper extremity PICC, tip positioned over the superior vena cava. Gross cardiomegaly. Mild, diffuse interstitial pulmonary opacity, unchanged. No new or  focal airspace opacity. IMPRESSION: 1. Interval placement of right upper extremity PICC, tip positioned over the superior vena cava. 2. Gross cardiomegaly. Mild, diffuse interstitial pulmonary opacity, unchanged, most likely edema. Electronically Signed   By: Delanna Ahmadi M.D.   On: 10/17/2021 09:02   EEG adult  Result Date: 10/17/2021 Lora Havens, MD     10/17/2021 11:00 AM Patient Name: Oscar Rosales MRN: [id number] Epilepsy Attending:  Lora Havens Referring Physician/Provider: Rick Duff, MD Date: 10/17/2021 Duration: 23.19 mins Patient history: 56 year old male with embolic infarcts on MRI today with worsening mental status and left upper extremity, left lower extremity twitching.  EEG to evaluate for seizure. Level of alertness: Awake, drowsy AEDs during EEG study: None Technical aspects: This EEG study was done with scalp electrodes positioned according to the 10-20 International system of electrode placement. Electrical activity was acquired at a sampling rate of 500Hz  and reviewed with a high frequency filter of 70Hz  and a low frequency filter of 1Hz . EEG data were recorded continuously and digitally stored. Description: The posterior dominant rhythm consists of 8 Hz activity of moderate voltage (25-35 uV) seen predominantly in posterior head regions, symmetric and reactive to eye opening and eye closing. Drowsiness was characterized by attenuation of the posterior background rhythm.  EEG showed intermittent generalized 5 to 6 Hz theta slowing hyperventilation and photic stimulation were not performed.   ABNORMALITY - Intermittent slow, generalized IMPRESSION: This study is suggestive of mild diffuse encephalopathy, nonspecific etiology. No seizures or epileptiform discharges were seen throughout the recording. Priyanka Barbra Sarks    PHYSICAL EXAM  Physical Exam  Constitutional: Ill appearing, frail middle-aged [place] American male.  Cardiovascular: Tachycardic and regular rhythm.  Respiratory: Labored, tachypneic and short of breath    NEURO:  Mental Status: Drowsy but arousable.  Speech slightly hypophonic  without dysarthria or aphasia.    Cranial Nerves:  II: PERRL.  III, IV, VI: Restricted upward gaze, bilateral ptosis, bilateral INO V: Sensation is intact to light touch and symmetrical to face.  VII: Slight right facial droop VIII: hearing intact to voice. IX, X: Phonation is normal.  XII: tongue is midline  without fasciculations. Sensation- Intact to light touch bilaterally.   Moves all 4 extremities well against gravity.  Slight intermittent tremor of the right upper extremity.  No fasciculations or twitches noted Gait- deferred    ASSESSMENT/PLAN Mr. Jeanluc Kellen is a 56 y.o. male with history of CHF, ascending thoracic aortic dilation, HTN, noncompliance with medications, CKD III presenting with AMS. He states that he woke up feeling tired. Went to work, and shortly after abruptly started to feel lightheaded and before he knew it, he passed out. Does not remember falling down, but woke up on ground. Unsure how long he was down, possibly 10-20 minutes. No witnesses of the the event. Someone found him down on the bathroom and called EMS. 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 He is currently on a heparin drip. MRI head shows scattered infarcts involving the right cerebellar hemisphere, left cerebral peduncle, left hippocampus, left thalamus, right occipital lobe, and left pre and postcentral gyri, some of which appear acute while others appear more subacute in chronicity. He will need a TEE and cardiac monitor (30 day or loop), and possible hypercoag panel and cardiology consult. TCD bubble study performed yesterday was negative, and TEE will be performed next week.  Stroke team will sign off but will remain available for  and questions or concerns.  Stroke:  Scattered infarcts involving multiple territories likely secondary embolic cryptogenic source.  Concurrent pulmonary emboli raise possibility of hypercoagulability or paradoxical embolism Code Stroke- No acute abnormality. Small vessel disease.  CTA head & neck No large vessel occlusion, hemodynamically significant stenosis, or evidence of dissection. 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  CXR- Cardiomegaly. There is pulmonary vascular  congestion and pulmonary edema in both lungs. Possibility of underlying pneumonia is not excluded MRI- Scattered infarcts involving the right cerebellar hemisphere, left cerebral peduncle, left hippocampus, left thalamus, right occipital lobe, and left pre and postcentral gyri, some of which appear acute while others appear more subacute in chronicity. CTA Chest- Mild nonocclusive pulmonary emboli within the right middle lobe pulmonary artery and right lower lobe medial segment pulmonary artery. Marked cardiomegaly, possibly mildly worsened from 08/26/2019. Small right and trace left pleural effusions. Scattered ground-glass opacities compatible with pulmonary edema, subsegmental atelectasis, and/or a mild infectious/inflammatory process 2D Echo - EF 15-20%, severely decrease LV function BLE Venous duplex- No evidence of DVT in lower extremities TCD bubble study negative  LDL 114 HgbA1c 5.2 VTE prophylaxis - Heparin IV    Diet   Diet Heart Room service appropriate? Yes; Fluid consistency: Thin   No antithrombotic prior to admission, now on heparin IV.  Therapy recommendations:  CIR Disposition:  Pending  Pulmonary Embolism Heparin IV protocol Management by primary team  AoC HFrEF exacerbation Home meds: Imdur, digoxin, torsemide Recent titration of torsemide r/t increasing BNP and sodium intake Echo pending  Hypertension Home meds:  hydralazine 100mg , carvedilol, clonidine 0.3 patch, cozaar Unstable- non compliant with home medication for approximately 3 months Permissive hypertension (OK if < 220/120) but gradually normalize in 5-7 days Long-term BP goal normotensive  Hyperlipidemia Home meds:  None LDL 114, goal < 70 Add Crestor  Continue statin at discharge  Other Stroke Risk Factors Coronary artery disease Congestive heart failure Seen by [facility] Consider cardiology consult   Chronic Kidney Disease- follows with [facility]  nephrology Dr. Olivia Mackie Monitor I&O Creatinine baseline 1.7-2.0 Cr elevated 1.8-> 2.08 -> 2.03-> 2.05  Variably positive ANA  Antibody testing negative. His initial ANA was negative, retest positive, antibody screening reassuring Done outpatient at [facility] day # 5  Patient remains neurologically stable but with some mild tremulousness which is likely from multifactorial encephalopathy related to respiratory and cardiac failure as well as now uremia and possible medication effect from amiodarone.  Continue IV heparin and will need to transition to NOAC for anticoagulation at discharge.  TEE scheduled for next week.  IgM anticardiolipin antibodies weakly positive but rest of hypercoagulable labs so far are negative..  May need repeat hypercoagulable panel at 6 to 9 months if anticoagulation is discontinued.  Recommend check amiodarone level and thyroid function tests.  Discussed with Dr. [name].  Greater than 50% time during this 50-minute visit was spent on counseling and coordination of care and discussion with patient and care team and answering questions.  Stroke team will sign off.  Kindly call for questions.  Antony Contras, MD Medical Director [facility] Pager: [phone] 10/17/2021 1:50 PM    To contact Stroke Continuity provider, please refer to [email or web address] After hours, contact General Neurology

## 2021-10-17 NOTE — Progress Notes (Signed)
°   10/17/21 0700  Assess: MEWS Score  Temp 97.8 F (36.6 C)  BP (!) 161/136  Pulse Rate (!) 116  ECG Heart Rate (!) 117  Resp (!) 30  SpO2 91 %  Assess: MEWS Score  MEWS Temp 0  MEWS Systolic 0  MEWS Pulse 2  MEWS RR 2  MEWS LOC 0  MEWS Score 4  MEWS Score Color Red  Assess: if the MEWS score is Yellow or Red  Were vital signs taken at a resting state? Yes  Focused Assessment No change from prior assessment  Early Detection of Sepsis Score *See Row Information* Medium  MEWS guidelines implemented *See Row Information* Yes  Treat  MEWS Interventions Escalated (See documentation below)  Pain Scale 0-10  Pain Score 0  Take Vital Signs  Increase Vital Sign Frequency  Red: Q 1hr X 4 then Q 4hr X 4, if remains red, continue Q 4hrs  Notify: Charge Nurse/RN  Name of Charge Nurse/RN Notified Kristi, RN  Date Charge Nurse/RN Notified 10/17/21  Time Charge Nurse/RN Notified 0755  Notify: Provider  Provider Name/Title Burnice Logan, IM teaching  Date Provider Notified 10/17/21  Time Provider Notified [phone]  Notification Type Page  Notification Reason Change in status (MEWS red, vitals RR change w apnea, HR changes, BP elevated, resp distress)  Provider response At bedside  Date of Provider Response 10/17/21  Time of Provider Response 0820   Writer noted red MEWS, color change from green throughout the night, went immediately to room to assess and retake vital signs. Noted patient hyperventilating w RR in 40s, instructed patient to breath in through nose and out through mouth several times, could not follow direction. At one point said he couldn't remember how to breath through nose. Hypertension noted, high 170s systolic. Patient had at least two apneic spells when at bedside, gave patient a little shake and woke to breathing in 40s. Heart monitor showing small pauses at this time as well a frequent PVCs and one to two beat runs of V-tach.   Rapid nurse called to bedside. Internal med  teaching service paged to alert of changes and rapid call. Rapid presented to bedside shortly, IM teaching service about 25 minutes later. 0800 Lasix given. Heart failure team paged, presented to bedside. Amiodarone was ordered. CVP was 11. EKG done. Chest CXR ordered. 10 mg hydralazine given, per HF team.   During this time at bedside, patient began to have involuntary twitching movements of arms and legs. Only oriented to self. During one sentence words disappeared and turned to jibberish. He could not tell us where he was, although, he could in the night. Patient's wife was called by MD is on her way here. Continue to monitor.  Continue to monitor.

## 2021-10-17 NOTE — Progress Notes (Addendum)
Subjective: No acute overnight events.   Patient was seen at bedside during rounds. Wife is present. Wife states that pt walked down the hall with PT with no problem. She is happy with the progress he has is making.  Pt reports feeling "good". No change in mental status, or any new acute focal deficits.   Objective:  Vital signs in last 24 hours: Vitals:   10/17/21 0800 10/17/21 0900 10/17/21 1000 10/17/21 1100  BP: (!) 179/124 (!) 174/133 (!) 160/113 (!) 152/126  Pulse: (!) 123 (!) 123 (!) 104 (!) 106  Resp: (!) 42 (!) 36 15 (!) 40  Temp:    97.8 F (36.6 C)  TempSrc:    Oral  SpO2: 94% 95% 95% 96%  Weight:      Height:       Constitutional: sleepy but arousable, ill-appearing, in mild distress HENT: normocephalic, atraumatic, mucous membranes moist Eyes: Dilated pupil on the left side. Intranuclear ophthalmoplegia noted. Cardiovascular: RRR, no m/r/g, 1+ bilateral LE edema  Pulmonary/Chest: apneic, diminished breath sounds bilaterally.  Extremities: No Twitching of the extremities noted   Neurological: A&O to person placed and situation, not to time.   Tongue slightly deviates to the right. Bilateral ptosis is present.  4/5 strength of right upper and lower extremity 5/5 strength of left upper and lower extremities Lack of coordination of fine motor movements of the right hand   Assessment/Plan:  Principal Problem:   Acute CVA (cerebrovascular accident) ([place]) Active Problems:   Hypertension, uncontrolled   Non-ischemic cardiomyopathy ([place])   Stage 3 chronic kidney disease ([place])   Acute pulmonary embolism (HCC)  Gabriela Greenstone is a 56 y.o. male with a pertinent PMH of nonischemic cardiomyopathy, HFrEF (EF 25-30% 02/2020), HLD, HTN, and CKD3b admitted for acute ischemic stroke 2/2 embolic infarctions, mild nonocclusive pulmonary emboli, and acute on chronic HFrEF exacerbation.    Multifocal ischemic brain infarcts Suspecting embolic stroke Twitching and lethargy   Stroke workup negative thus far. Concern yesterday for hemorrhagic conversion vs seizure given new twitching and lethargy with re-imaging largely reassuring, showing evolution of prior infarcts but no large territory infarct or hemorrhagic conversion of prior infarcts. EEG negative, and thyroid studies not impressive. Suspect multifactorial encephalopathy related to respiratory and cardiac failure. Making clinical progress today. Plan for TEE once clinically stable, and cardiac monitor after discharge. Remains high risk for hemorrhagic conversion. Goal to gradually normalize but has been persistently HTN; better controlled today with up-titration of Hydral.  - Continue hydralazine 50 tid  - Continue milrinone per cards  - Continue Heparin infusion; switch to NOAC at d/c.  - Follow up on amiodarone levels - Crestor 20 mg - Telemetry - Frequent neuro checks  - PT/OT/SLP - CIR vs SNF, pending medical stabilization    Mild nonocclusive pulmonary emboli  Unclear source. Hypercoagulably workup largely unremarkable till now; awaiting factor 5 Leiden and prothrombin gene mutation. TTE with LVEF 15-20% with severely decreased left ventricle function, plan for TEE once clinically stable.  - TEE per cards  - Continue on heparin infusion, will likely need DOAC loading dose. - Follow-up on remaining hypercoagulable studies    AoC HFrEF exacerbation Nonischemic hypertensive cardiomyopathy Acute pulmonary edema in the setting of a hypertensive crisis Hx of uncontrolled HTN Put out 1/2 L with net 3.5L, and weight trending down. Improvement in LE edema. Continuing to optimize BP given acute pulmonary edema yesterday.  Currently not candidate for advanced therapies given comorbidities, but will possibly trial Entresto per cards.  -  Advanced HF team following, appreciate recs  - Continue IV Lasix 80 mg bid - Continue Milrinone  - Started Bidil  - Amio for PVCs - Daily weights and I/O's - Cardiac  monitoring  - hydralazine 50 tid; hold off on restarting BB and ARB for now--discuss restarting ARB when kidney fxn near baseline, BB when HF stabilized   AKI on CKD3b Likely cardiorenal from low output HF. Baseline Cr 1.6-1.8. Cr slowly up-trending 2 -> 2.1.  - Continue IV Lasix as above - Trend BMP - Avoid nephrotoxins    Stable ascending thoracic aortic dilatation  4.2 cm in size. - Annual imaging follow-up recommended by CTA or MRA     Best Practice: Diet: Dysphagia 3  IVF: None, None VTE: Heparin Code: Full PT/OT: CIR  [name], MD  Internal Medicine Resident, PGY-1 Pager: [phone] After 5pm on weekdays and 1pm on weekends: On Call pager [phone]

## 2021-10-17 NOTE — Progress Notes (Signed)
°  Amiodarone Drug - Drug Interaction Consult Note  Recommendations: -No changes recommended  Amiodarone is metabolized by the cytochrome P450 system and therefore has the potential to cause many drug interactions. Amiodarone has an average plasma half-life of 50 days (range 20 to 100 days).   There is potential for drug interactions to occur several weeks or months after stopping treatment and the onset of drug interactions may be slow after initiating amiodarone.   [x]  Statins: Increased risk of myopathy. Simvastatin- restrict dose to 20mg  daily. Other statins: counsel patients to report any muscle pain or weakness immediately.  []  Anticoagulants: Amiodarone can increase anticoagulant effect. Consider warfarin dose reduction. Patients should be monitored closely and the dose of anticoagulant altered accordingly, remembering that amiodarone levels take several weeks to stabilize.  []  Antiepileptics: Amiodarone can increase plasma concentration of phenytoin, the dose should be reduced. Note that small changes in phenytoin dose can result in large changes in levels. Monitor patient and counsel on signs of toxicity.  []  Beta blockers: increased risk of bradycardia, AV block and myocardial depression. Sotalol - avoid concomitant use.  []   Calcium channel blockers (diltiazem and verapamil): increased risk of bradycardia, AV block and myocardial depression.  []   Cyclosporine: Amiodarone increases levels of cyclosporine. Reduced dose of cyclosporine is recommended.  []  Digoxin dose should be halved when amiodarone is started.  []  Diuretics: increased risk of cardiotoxicity if hypokalemia occurs.  []  Oral hypoglycemic agents (glyburide, glipizide, glimepiride): increased risk of hypoglycemia. Patient's glucose levels should be monitored closely when initiating amiodarone therapy.   []  Drugs that prolong the QT interval:  Torsades de pointes risk may be increased with concurrent use - avoid if  possible.  Monitor QTc, also keep magnesium/potassium WNL if concurrent therapy can't be avoided.  Antibiotics: e.g. fluoroquinolones, erythromycin.  Antiarrhythmics: e.g. quinidine, procainamide, disopyramide, sotalol.  Antipsychotics: e.g. phenothiazines, haloperidol.   Lithium, tricyclic antidepressants, and methadone. Thank You,   , PharmD Clinical Pharmacist **Pharmacist phone directory can now be found on [email or web address] ([email or web address]).  Listed under [facility].

## 2021-10-17 NOTE — Progress Notes (Signed)
Writer went down to MRI with patient this afternoon. On the way down to MRI, it was noticed that patient's right facial droop worse than in a.m. Left arm and hand swelling was worsening from a.m.  When patient getting prepped for MRI scanner, involuntary jerking movements of left shoulder again noted, similar to in a.m.  Continue to monitor.

## 2021-10-18 ENCOUNTER — Other Ambulatory Visit (HOSPITAL_COMMUNITY): Payer: Self-pay

## 2021-10-18 DIAGNOSIS — I5023 Acute on chronic systolic (congestive) heart failure: Secondary | ICD-10-CM | POA: Diagnosis not present

## 2021-10-18 DIAGNOSIS — I428 Other cardiomyopathies: Secondary | ICD-10-CM | POA: Diagnosis not present

## 2021-10-18 DIAGNOSIS — I639 Cerebral infarction, unspecified: Secondary | ICD-10-CM | POA: Diagnosis not present

## 2021-10-18 LAB — CBC
HCT: 39.2 % (ref 39.0–52.0)
Hemoglobin: 12.9 g/dL — ABNORMAL LOW (ref 13.0–17.0)
MCH: 31.4 pg (ref 26.0–34.0)
MCHC: 32.9 g/dL (ref 30.0–36.0)
MCV: 95.4 fL (ref 80.0–100.0)
Platelets: 124 10*3/uL — ABNORMAL LOW (ref 150–400)
RBC: 4.11 MIL/uL — ABNORMAL LOW (ref 4.22–5.81)
RDW: 15 % (ref 11.5–15.5)
WBC: 11.2 10*3/uL — ABNORMAL HIGH (ref 4.0–10.5)
nRBC: 0 % (ref 0.0–0.2)

## 2021-10-18 LAB — BASIC METABOLIC PANEL
Anion gap: 10 (ref 5–15)
BUN: 36 mg/dL — ABNORMAL HIGH (ref 6–20)
CO2: 27 mmol/L (ref 22–32)
Calcium: 8.2 mg/dL — ABNORMAL LOW (ref 8.9–10.3)
Chloride: 102 mmol/L (ref 98–111)
Creatinine, Ser: 2.15 mg/dL — ABNORMAL HIGH (ref 0.61–1.24)
GFR, Estimated: 35 mL/min — ABNORMAL LOW (ref >60)
Glucose, Bld: 145 mg/dL — ABNORMAL HIGH (ref 70–99)
Potassium: 3.3 mmol/L — ABNORMAL LOW (ref 3.5–5.1)
Sodium: 139 mmol/L (ref 135–145)

## 2021-10-18 LAB — HEPARIN LEVEL (UNFRACTIONATED)
Heparin Unfractionated: <0.1 IU/mL — ABNORMAL LOW (ref 0.30–0.70)
Heparin Unfractionated: >1.1 IU/mL — ABNORMAL HIGH (ref 0.30–0.70)
Heparin Unfractionated: 0.1 IU/mL — ABNORMAL LOW (ref 0.30–0.70)
Heparin Unfractionated: <0.1 IU/mL — ABNORMAL LOW (ref 0.30–0.70)

## 2021-10-18 LAB — COOXEMETRY PANEL
Carboxyhemoglobin: 1.3 % (ref 0.5–1.5)
Methemoglobin: 1 % (ref 0.0–1.5)
O2 Saturation: 74.6 %
Total hemoglobin: 11 g/dL — ABNORMAL LOW (ref 12.0–16.0)

## 2021-10-18 LAB — FACTOR 5 LEIDEN

## 2021-10-18 LAB — T3, FREE: T3, Free: 1.8 pg/mL — ABNORMAL LOW (ref 2.0–4.4)

## 2021-10-18 MED ORDER — METOLAZONE 2.5 MG PO TABS
2.5000 mg | ORAL_TABLET | Freq: Once | ORAL | Status: AC
  Administered 2021-10-18: 2.5 mg via ORAL
  Filled 2021-10-18: qty 1

## 2021-10-18 MED ORDER — POTASSIUM CHLORIDE 20 MEQ PO PACK
40.0000 meq | PACK | Freq: Once | ORAL | Status: AC
  Administered 2021-10-18: 40 meq via ORAL
  Filled 2021-10-18: qty 2

## 2021-10-18 MED ORDER — ISOSORB DINITRATE-HYDRALAZINE 20-37.5 MG PO TABS
2.0000 | ORAL_TABLET | Freq: Three times a day (TID) | ORAL | Status: DC
  Administered 2021-10-18 – 2021-10-25 (×21): 2 via ORAL
  Filled 2021-10-18 (×20): qty 2

## 2021-10-18 MED ORDER — AMIODARONE HCL 200 MG PO TABS
200.0000 mg | ORAL_TABLET | Freq: Two times a day (BID) | ORAL | Status: DC
  Administered 2021-10-18 – 2021-10-25 (×15): 200 mg via ORAL
  Filled 2021-10-18 (×15): qty 1

## 2021-10-18 MED ORDER — POTASSIUM CHLORIDE CRYS ER 20 MEQ PO TBCR
40.0000 meq | EXTENDED_RELEASE_TABLET | Freq: Once | ORAL | Status: DC
  Filled 2021-10-18: qty 2

## 2021-10-18 NOTE — Progress Notes (Signed)
Occupational Therapy Treatment Patient Details Name: Oscar Rosales MRN: [id number] DOB: 1965-09-27 Today's Date: 10/18/2021   History of present illness Pt is a 56 y/o male admitted secondary to fatigue and syncopal episode at work. 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 Pt also found to have mild nonocclusive pulmonary emboli and started on Heparin drip. PMH including but not limited to HFrEF (EF 25-30% 02/2020), HLD, HTN, CAD, and CKD3b.  Per chart "Concern yesterday for hemorrhagic conversion vs seizure given new twitching and lethargy with re-imaging largely reassuring, showing evolution of prior infarcts but no large territory infarct or hemorrhagic conversion of prior infarcts. EEG negative, and thyroid studies not impressive. Suspect multifactorial encephalopathy related to respiratory and cardiac failure."    OT comments  Pt received sitting in recliner. BP 153/155mmHg, discussed with RN and kept pt at seated level this session.  Pt completed RUE therex with assistance from therapist for AAROM and PROM exercises. Pt required minA for grooming due to RUE limitations and visual limitations. Pt is continuing to progress toward established OT goals. Pt will continue to benefit from skilled OT services to maximize safety and independence with ADL/IADL and functional mobility. Will continue to follow acutely and progress as tolerated.     Recommendations for follow up therapy are one component of a multi-disciplinary discharge planning process, led by the attending physician.  Recommendations may be updated based on patient status, additional functional criteria and insurance authorization.    Follow Up Recommendations  Acute inpatient rehab (3hours/day)    Assistance  Recommended at Discharge    Patient can return home with the following  A lot of help with walking and/or transfers;A lot of help with bathing/dressing/bathroom;Help with stairs or ramp for entrance;Assist for transportation;Assistance with feeding;Direct supervision/assist for medications management   Equipment Recommendations  Other (comment)    Recommendations for Other Services Rehab consult    Precautions / Restrictions Precautions Precautions: Fall;Other (comment) Precaution Comments: watch HR, BP Restrictions Weight Bearing Restrictions: No       Mobility Bed Mobility Overal bed mobility: Needs Assistance             General bed mobility comments: pt sitting in recliner upon arrival, pt was able to progress self into long sitting in recliner    Transfers                   General transfer comment: deferred secondary to BP     Balance Overall balance assessment: Needs assistance Sitting-balance support: Feet supported Sitting balance-Leahy Scale: Fair                                     ADL either performed or assessed with clinical judgement   ADL Overall ADL's : Needs assistance/impaired Eating/Feeding: Minimal assistance;Sitting   Grooming: Minimal assistance;Sitting;Oral care;Wash/dry face;Wash/dry hands Grooming Details (indicate cue type and reason): completed while sitting in recliner Upper Body Bathing: Moderate assistance;Sitting                             General ADL Comments: pt kept in seated position secondary to BP 153/24, RN approved session while sitting    Extremity/Trunk Assessment Upper Extremity  Assessment Upper Extremity Assessment: RUE deficits/detail RUE Deficits / Details: pt at risk for frozen shoulder, noted increased edema in RUE. Pt able to utilize RUE to brush his teeth, decreased grip strength, decreased fine motor coordination and decreased gross motor coordination RUE Coordination:  decreased fine motor;decreased gross motor   Lower Extremity Assessment Lower Extremity Assessment: Defer to PT evaluation        Vision   Additional Comments: R eye closed majority of the session. pt reports no change in vision otherwise.   Perception     Praxis      Cognition Arousal/Alertness: Awake/alert Behavior During Therapy: Flat affect Overall Cognitive Status: Impaired/Different from baseline Area of Impairment: Attention, Memory, Following commands, Safety/judgement, Awareness, Problem solving                   Current Attention Level: Selective Memory: Decreased short-term memory Following Commands: Follows one step commands consistently Safety/Judgement: Decreased awareness of safety, Decreased awareness of deficits Awareness: Emergent Problem Solving: Slow processing, Requires verbal cues General Comments: pt continued to remain drowsy but engaged in ADL and therex with prompts and increased time. Pt required cues for completion of grooming while seated in recliner        Exercises Exercises: Other exercises Other Exercises Other Exercises: scapular mobilization x10 in all directions prior to engaging in RUE ROM Other Exercises: PROM shoulder flexion to 90*, abduction to 80* x10 each Other Exercises: forearm supination/pronation x10 AAROM Other Exercises: table slides x10 with support at elbow and facilitation of scapular mobility Other Exercises: scapular retraction x10 hold x3 seconds    Shoulder Instructions       General Comments pt on 4lnc throughout session SpO2 >94%;HR 90s during session;BP 153/150mmHg while sitting in recliner. RN notified    Pertinent Vitals/ Pain       Pain Assessment Pain Assessment: No/denies pain  Home Living                                          Prior Functioning/Environment              Frequency  Min 2X/week        Progress Toward Goals  OT Goals(current goals can now be  found in the care plan section)  Progress towards OT goals: Progressing toward goals  Acute Rehab OT Goals Patient Stated Goal: to cook again OT Goal Formulation: With patient Time For Goal Achievement: 11/01/21 Potential to Achieve Goals: Good ADL Goals Pt Will Perform Grooming: with modified independence;sitting Pt Will Perform Upper Body Dressing: with modified independence;sitting Pt Will Perform Lower Body Dressing: with modified independence;sit to/from stand Pt Will Transfer to Toilet: with modified independence;ambulating  Plan Discharge plan remains appropriate    Co-evaluation                 AM-PAC OT "6 Clicks" Daily Activity     Outcome Measure   Help from another person eating meals?: A Little Help from another person taking care of personal grooming?: A Little Help from another person toileting, which includes using toliet, bedpan, or urinal?: A Lot Help from another person bathing (including washing, rinsing, drying)?: A Lot Help from another person to put on and taking off regular upper body clothing?: A Little Help from another person to put on and taking off regular lower body clothing?: A Lot 6 Click Score: 15  End of Session Equipment Utilized During Treatment: Oxygen  OT Visit Diagnosis: Unsteadiness on feet (R26.81);Other abnormalities of gait and mobility (R26.89);Muscle weakness (generalized) (M62.81);Hemiplegia and hemiparesis;Low vision, both eyes (H54.2) Hemiplegia - Right/Left: Right Hemiplegia - dominant/non-dominant: Dominant Hemiplegia - caused by: Cerebral infarction   Activity Tolerance Patient tolerated treatment well   Patient Left in chair;with call bell/phone within reach;with chair alarm set;with family/visitor present   Nurse Communication Mobility status        Time: 0932-6712 OT Time Calculation (min): 36 min  Charges: OT General Charges $OT Visit: 1 Visit OT Treatments $Self Care/Home Management : 23-37  mins  Rosey Bath OTR/L Acute Rehabilitation Services Office: [phone]   Rebeca Alert 10/18/2021, 5:00 PM

## 2021-10-18 NOTE — Plan of Care (Signed)
°  Problem: Ischemic Stroke/TIA Tissue Perfusion: Goal: Complications of ischemic stroke/TIA will be minimized Outcome: Not Progressing   Problem: Education: Goal: Knowledge of General Education information will improve Description: Including pain rating scale, medication(s)/side effects and non-pharmacologic comfort measures Outcome: Not Progressing   Problem: Health Behavior/Discharge Planning: Goal: Ability to manage health-related needs will improve Outcome: Not Progressing

## 2021-10-18 NOTE — Progress Notes (Signed)
IP rehab admissions - likely will need SNF placement at discharge.  Noted that wife needs to return to work and does not have caregiver support for patient after discharge.  Call for questions.  [phone]

## 2021-10-18 NOTE — Progress Notes (Signed)
Patient very hungry and thirsty this a.m. Much more oriented and speech clearer than yesterday.   Writer helping feed patient. After about 15 bites, patient began vomiting undigested food, large amount. Denies nausea, "just happened."   Wife at bedside, updated on patient's progress since yesterday.  Continue too monitor.

## 2021-10-18 NOTE — Progress Notes (Signed)
ANTICOAGULATION CONSULT NOTE - Follow Up Consult  Pharmacy Consult for Heparin Indication: pulmonary embolus and stroke  Allergies  Allergen Reactions   Lisinopril Cough    SWITCHED TO LOSARTAN    Patient Measurements: Height: 5\' 11"  (180.3 cm) Weight: 63.1 kg (139 lb 1.8 oz) IBW/kg (Calculated) : 75.3 Heparin Dosing Weight: 77.1 kg   Vital Signs: Temp: 97.7 F (36.5 C) (02/03 1122) Temp Source: Axillary (02/03 1122) BP: 151/110 (02/03 1122) Pulse Rate: 90 (02/03 1122)  Labs: Recent Labs    10/16/21 0500 10/17/21 0550 10/18/21 0421 10/18/21 0544 10/18/21 0712  HGB 14.0 14.5 12.9*  --   --   HCT 41.3 43.3 39.2  --   --   PLT 137* 152 124*  --   --   HEPARINUNFRC 0.43 0.44 <0.10* >1.10* 0.10*  CREATININE 2.05* 2.03* 2.15*  --   --      Estimated Creatinine Clearance: 34.2 mL/min (A) (by C-G formula based on SCr of 2.15 mg/dL (H)).   Medications:  Scheduled:   Chlorhexidine Gluconate Cloth  6 each Topical Daily   furosemide  80 mg Intravenous BID   isosorbide-hydrALAZINE  2 tablet Oral TID   metolazone  2.5 mg Oral Once   potassium chloride  40 mEq Oral Once   rosuvastatin  20 mg Oral Daily   sodium chloride flush  10-40 mL Intracatheter Q12H   Infusions:   sodium chloride     amiodarone 30 mg/hr (10/18/21 0414)   heparin 1,200 Units/hr (10/18/21 0255)   milrinone 0.25 mcg/kg/min (10/17/21 1744)    Assessment: 56 yo M continues on IV heparin for new PE on admit and scattered embolic infarcts.    Heparin level >1 this am then undetectable on recheck drawn peripherally. No bleeding issues noted. Hgb down 14>12.9. Plt count low but stable 120s.   Will target lower end of heparin goal in the setting of acute infarcts as well.  Apixaban copay check ($47)   Goal of Therapy:  Heparin level 0.3-0.5 units/ml Monitor platelets by anticoagulation protocol: Yes   Plan:  Increase heparin to 1300 units/hr -hesitant to make large increase given previously  therapeutic levels Heparin level and CBC with AM labs. Follow-up plans for oral anticoagulation. Monitor for s/sx bleeding.  56 PharmD., BCPS Clinical Pharmacist 10/18/2021 11:56 AM

## 2021-10-18 NOTE — Progress Notes (Addendum)
Advanced Heart Failure Rounding Note  PCP-Cardiologist: None   Subjective:    1/30: DBA started for low output, initial Co-ox 44%  2/1 DBA stopped and switched to milrinone due to hypertension  On Milrinone 0.25 mcg.CO-OX 75%.   Able to walk down the hall today.  Denies SOB.    Objective:   Weight Range: 63.1 kg Body mass index is 19.4 kg/m.   Vital Signs:   Temp:  [97.5 F (36.4 C)-98.3 F (36.8 C)] 97.7 F (36.5 C) (02/03 1122) Pulse Rate:  [90-110] 90 (02/03 1122) Resp:  [13-36] 17 (02/03 1122) BP: (123-173)/(91-118) 151/110 (02/03 1122) SpO2:  [90 %-99 %] 97 % (02/03 1122) Weight:  [63.1 kg] 63.1 kg (02/03 0527) Last BM Date: 10/16/21  Weight change: Filed Weights   10/16/21 0323 10/17/21 0500 10/18/21 0527  Weight: 64.8 kg 63.1 kg 63.1 kg    Intake/Output:   Intake/Output Summary (Last 24 hours) at 10/18/2021 1136 Last data filed at 10/18/2021 1025 Gross per 24 hour  Intake --  Output 1150 ml  Net -1150 ml      Physical Exam   General:  Sitting in the chair.  No resp difficulty HEENT: normal Neck: supple. JVP to jaw . Carotids 2+ bilat; no bruits. No lymphadenopathy or thryomegaly appreciated. Cor: PMI nondisplaced. Regular rate & rhythm. No rubs, gallops or murmurs. Lungs: clear Abdomen: soft, nontender, nondistended. No hepatosplenomegaly. No bruits or masses. Good bowel sounds. Extremities: no cyanosis, clubbing, rash, edema Neuro: alert & orientedx3, cranial nerves grossly intact. moves all 4 extremities w/o difficulty. Affect pleasant  Telemetry   ST with rare PVCs  less than 5 per hour. Personally checked EKG   ST with PVCs 124 bpm  Labs    CBC Recent Labs    10/17/21 0550 10/18/21 0421  WBC 10.2 11.2*  HGB 14.5 12.9*  HCT 43.3 39.2  MCV 95.6 95.4  PLT 152 [id number]*   Basic Metabolic Panel Recent Labs    10/15/21 1716 10/16/21 0500 10/17/21 0550 10/18/21 0421  NA 139   < > 141 139  K 3.9   < > 3.5 3.3*  CL 104   < > 103  102  CO2 27   < > 26 27  GLUCOSE 110*   < > 108* 145*  BUN 40*   < > 38* 36*  CREATININE 1.97*   < > 2.03* 2.15*  CALCIUM 8.2*   < > 8.7* 8.2*  MG 1.9  --  2.0  --    < > = values in this interval not displayed.   Liver Function Tests No results for input(s): AST, ALT, ALKPHOS, BILITOT, PROT, ALBUMIN in the last 72 hours. No results for input(s): LIPASE, AMYLASE in the last 72 hours. Cardiac Enzymes No results for input(s): CKTOTAL, CKMB, CKMBINDEX, TROPONINI in the last 72 hours.  BNP: BNP (last 3 results) Recent Labs    10/11/21 1245  BNP 3,942.1*    ProBNP (last 3 results) No results for input(s): PROBNP in the last 8760 hours.   D-Dimer No results for input(s): DDIMER in the last 72 hours. Hemoglobin A1C No results for input(s): HGBA1C in the last 72 hours. Fasting Lipid Panel No results for input(s): CHOL, HDL, LDLCALC, TRIG, CHOLHDL, LDLDIRECT in the last 72 hours. Thyroid Function Tests Recent Labs    10/17/21 1358  TSH 3.545  T3FREE 1.8*    Other results:   Imaging    CT HEAD WO CONTRAST (5MM)  Result Date:  10/17/2021 CLINICAL DATA:  Insert is EXAM: CT HEAD WITHOUT CONTRAST TECHNIQUE: Contiguous axial images were obtained from the base of the skull through the vertex without intravenous contrast. RADIATION DOSE REDUCTION: This exam was performed according to the departmental dose-optimization program which includes automated exposure control, adjustment of the mA and/or kV according to patient size and/or use of iterative reconstruction technique. COMPARISON:  MRI 10/11/2021 FINDINGS: Brain: No evidence of acute intracranial hemorrhage or extra-axial collection.Evolving scattered infarcts involving the supratentorial and infratentorial brain, as described on recent MRI.No evidence of new large territory infarct. No concerning mass effectconfluent periventricular and subcortical white matter hypoattenuation, which is nonspecific but likely sequela of chronic  small vessel ischemic disease.Mild cerebral atrophy Vascular: No hyperdense vessel. Skull: Negative for fracture. Sinuses/Orbits: Minimal scattered mucosal thickening. The orbits are unremarkable. Other: None. IMPRESSION: Evolving scattered infarcts in the supra and infratentorial brain. No evidence of new large territory infarct or hemorrhagic conversion. Background of moderate chronic small vessel ischemic disease. Electronically Signed   By: Maurine Simmering M.D.   On: 10/17/2021 12:17   MR BRAIN WO CONTRAST  Result Date: 10/17/2021 CLINICAL DATA:  Neuro deficit, acute, stroke suspected EXAM: MRI HEAD WITHOUT CONTRAST TECHNIQUE: Multiplanar, multiecho pulse sequences of the brain and surrounding structures were obtained without intravenous contrast. COMPARISON:  10/11/2021 FINDINGS: Motion artifact is present. Brain: Areas of diffusion hyperintensity are again identified for example involving the posterior right cerebellum, left hippocampus, right occipital lobe, left thalamus, left cerebral peduncle and left precentral gyrus. These correspond to evolving recent infarcts seen on the prior MRI. However, several of these appear to have superimposed more acute ischemic change given appearance on DWI and ADC as well as extent of involvement. Possible new punctate foci in the right thalamocapsular region. No mass effect. Stable findings of chronic microvascular ischemic changes as well as scattered small chronic infarcts. Foci of chronic microhemorrhage in the right thalamus. Ventricles are stable in size. Vascular: Major vessel flow voids at the skull base are preserved. Skull and upper cervical spine: Marrow signal appears within normal limits. Sinuses/Orbits: Paranasal sinuses are aerated. Orbits are unremarkable. Other: Sella is unremarkable.  Mastoid air cells are clear. IMPRESSION: Significantly motion degraded. Evolving recent infarcts seen on prior MRI, some of which demonstrate superimposed likely more acute  ischemia. Possible new acute or subacute punctate right thalamocapsular infarcts. Electronically Signed   By: Macy Mis M.D.   On: 10/17/2021 18:31     Medications:     Scheduled Medications:  Chlorhexidine Gluconate Cloth  6 each Topical Daily   furosemide  80 mg Intravenous BID   hydrALAZINE  50 mg Oral Q8H   rosuvastatin  20 mg Oral Daily   sodium chloride flush  10-40 mL Intracatheter Q12H    Infusions:  sodium chloride     amiodarone 30 mg/hr (10/18/21 0414)   heparin 1,200 Units/hr (10/18/21 0255)   milrinone 0.25 mcg/kg/min (10/17/21 1744)    PRN Medications: acetaminophen **OR** acetaminophen (TYLENOL) oral liquid 160 mg/5 mL **OR** acetaminophen, guaiFENesin-dextromethorphan, senna-docusate, sodium chloride flush    Patient Profile   56 y.o. male with hx chronic systolic CHF/NICM, HTN, CKD IIIb, hx hepatitis C infection, noncompliance with medical therapy. Admitted with CVA, PE and acute on chronic systolic CHF w/ low output. Has been followed at [facility].  Echo with biventricular hypertrophy/dysfunction EF 15-20% Moderate MR moderate RV dysfunction.  Assessment/Plan   Acute on chronic systolic CHF/NICM: -Diagnosed 2018. Normal coronaries on LHC in 2019. Felt to be due to  uncontrolled hypertension. -EF previously recovered to 45-50%.  -EF down to 25-30% in 06/21 in setting of uncontrolled HTN.  -Echo 01/23: EF 15-20%, RV moderately reduced, severe biatrial enlargement, moderate MR, moderate TR, moderate AI -Volume overloaded w/ low output, initial co-ox 44%. Earlier this week DBA stopped and switched to milrinone 0.25 mcg.  - CO -OX 75%. Continue milrinone 0.25 mcg.  - Continue to diurese with IV lasix. -No beta blocker with a/c CHF -Now off losartan with AKI on CKD -Eventual SGLT2i. A1c 5.2% -Start bidil 2 tabs thee times a day.   Will check on insurance coverage. CO-Pay 47.00 may need to switch to hydralazine/imdur at d/c -May need RHC depending on  coarse -Likely not candidate for advanced therapies currently d/t noncompliance and renal impairment.   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 Scattered infarcts on MRI brain -No large vessel occlusion on CTA head and neck -Negative bubble study on echo - MRI- Scattered infarcts involving the right cerebellar hemisphere, left cerebral peduncle, left hippocampus, left thalamus, right occipital lobe, and left pre and postcentral gyri, some of which appear acute while others appear more subacute in chronicity. - On statin.   - Neuro signed off.  PT/OT following.   3. PE: -Incidentally noted on CTA chest -On heparin gtt -LE dopplers negative for DVT -Hypercoagulable labs pending   4. AKI on CKD IIIb: -Baseline Scr 1.7 -SCr peaked to 2.22 -->today 2.05-->2.03 -->2.15  -Renal US c/w medical renal disease. -suspect cardiorenal from low output HF  5. PVCs /NSVT Placed on amio drip 2/2 due to increased PVC burden. PVC suppressed today < 5 per hour. Stop amio drip and start amio 200 mg twice a day.   6. HTN Uncontrolled. - Adding bidil as above.   PT recommending CIR.    Length of Stay: 6  Amy Clegg, NP  10/18/2021, 11:36 AM  Advanced Heart Failure Team Pager [phone] (M-F; 7a - 5p)  Please contact [facility] for night-coverage after hours (5p -7a ) and weekends on [email or web address]  Patient seen and examined with the above-signed Advanced Practice Provider and/or Housestaff. I personally reviewed laboratory data, imaging studies and relevant notes. I independently examined the patient and formulated the important aspects of the plan. I have edited the note to reflect any of my changes or salient points. I have personally discussed the plan with the patient and/or family.  Developed hypertensive crisis yesterday with acute pulmonary edema. IV lasix increased.   Remains on milrinone. Co-ox ok but still volume overloaded. Very weak but was able to walk some with PT today  SCr remains 2.0 ->  2.1  General:  Weak appearing. No resp difficulty HEENT: normal Neck: supple. JVP to jaw  Carotids 2+ bilat; no bruits. No lymphadenopathy or thryomegaly appreciated. Cor: Regular rate & rhythm. No rubs, gallops or murmurs. Lungs: crackles at bases Abdomen: soft, nontender, nondistended. No hepatosplenomegaly. No bruits or masses. Good bowel sounds. Extremities: no cyanosis, clubbing, rash, 1+ edema Neuro: Lethargic but answers questions. Weak on R   Extremely tenuous from both a cardiac and neuro perspective. Will continue milrinone. Increase anti-HTN regimen and diuretics. Unfortunately will not be candidate for advanced therapies. Supp K  Glori Bickers, MD  12:06 PM

## 2021-10-18 NOTE — Progress Notes (Signed)
ANTICOAGULATION CONSULT NOTE  Pharmacy Consult for Heparin Indication: pulmonary embolus and stroke  Allergies  Allergen Reactions   Lisinopril Cough    SWITCHED TO LOSARTAN    Patient Measurements: Height: 5\' 11"  (180.3 cm) Weight: 63.1 kg (139 lb 1.8 oz) IBW/kg (Calculated) : 75.3  Heparin Dosing Weight: 77 kg  Vital Signs: Temp: 97.8 F (36.6 C) (02/03 1545) Temp Source: Axillary (02/03 1545) BP: 156/118 (02/03 1545) Pulse Rate: 92 (02/03 1545)  Labs: Recent Labs    10/16/21 0500 10/17/21 0550 10/18/21 0421 10/18/21 0544 10/18/21 0712 10/18/21 1922  HGB 14.0 14.5 12.9*  --   --   --   HCT 41.3 43.3 39.2  --   --   --   PLT 137* 152 124*  --   --   --   HEPARINUNFRC 0.43 0.44 <0.10* >1.10* 0.10* <0.10*  CREATININE 2.05* 2.03* 2.15*  --   --   --     Estimated Creatinine Clearance: 34.2 mL/min (A) (by C-G formula based on SCr of 2.15 mg/dL (H)).    Assessment: 56 y.o. male who was admitted for CVA with incidental PE found on CTA. Pharmacy consulted to manage heparin infusion.   Hgb down 14>12.9. Plt count low stable. Targeting lower end of heparin goal in the setting of acute infarcts.   Heparin level was >1 this morning with repeat being undetectable on recheck drawn peripherally. No bleeding issues noted. Repeat heparin level also undetectable despite rate increase to 1300 units/hr. Per discussion with RN this evening, line inspected with no obvious issues, but upon flushing the line no blood return was noted. Heparin infusion has now been changed to PICC. Will hold off on increasing rate in anticipation that heparin level will now return back to therapeutic based on previous levels at this rate.   Goal of Therapy:  Heparin level 0.3-0.5 units/ml Monitor platelets by anticoagulation protocol: Yes   Plan:  Continue heparin infusion at 1300 units/hr Check heparin level in 6 hours and daily while on heparin Continue to monitor H&H and platelets Follow-up  plans for oral anticoagulation    Thank you for allowing pharmacy to be a part of this patients care.  58, PharmD Clinical Pharmacist

## 2021-10-18 NOTE — TOC Benefit Eligibility Note (Signed)
Patient Product/process development scientist completed.    The patient is currently admitted and upon discharge could be taking isosorbide-hydralyzine (Bidil) 20-37.5 mg.  The current 30 day co-pay is, $47.00.   The patient is insured through [facility] [employer]     Roland Earl, CPhT Pharmacy Patient Advocate Specialist [facility] Patient Advocate Team Direct Number: [phone]  Fax: [phone]

## 2021-10-18 NOTE — Progress Notes (Addendum)
Subjective: No acute overnight events.   Patient is seen at bedside during rounds this morning. He reports feeling well. He is sleepy but is ready to eat breakfast.   Improvement in mental status compared to yesterday with no new acute focal deficits.   Objective:  Vital signs in last 24 hours: Vitals:   10/18/21 2300 10/19/21 0316 10/19/21 0738 10/19/21 1108  BP: (!) 132/97 (!) 130/94 (!) 146/100 (!) 168/102  Pulse: 93 82 79 87  Resp: 17 17 14 14   Temp: 97.9 F (36.6 C) 98.2 F (36.8 C) 97.6 F (36.4 C) 97.7 F (36.5 C)  TempSrc: Oral Oral Oral Oral  SpO2: 97% 99% 98% 97%  Weight:  60.6 kg    Height:       Constitutional: sleepy but arousable, ill-appearing, in mild distress HENT: normocephalic, atraumatic, mucous membranes moist Eyes: Dilated pupil on the left side. Intranuclear ophthalmoplegia noted. Cardiovascular: RRR, no m/r/g, 1+ bilateral LE edema  Pulmonary/Chest: diminished breath sounds bilaterally.  Extremities: No Twitching of the extremities noted   Neurological: A&O to person placed and situation, not to time.   Tongue slightly deviates to the right. Bilateral ptosis is present.  4/5 strength of right upper and lower extremity 5/5 strength of left upper and lower extremities Lack of coordination of fine motor movements of the right hand   Assessment/Plan:  Principal Problem:   Acute CVA (cerebrovascular accident) ([place]) Active Problems:   Hypertension, uncontrolled   Non-ischemic cardiomyopathy ([place])   Stage 3 chronic kidney disease ([place])   Acute pulmonary embolism (H[facility])  Oscar Rosales is a 56 y.o. male with a pertinent PMH of nonischemic cardiomyopathy, HFrEF (EF 25-30% 02/2020), HLD, HTN, and CKD3b admitted for acute ischemic stroke 2/2 embolic infarctions, mild nonocclusive pulmonary emboli, and acute on chronic HFrEF exacerbation.    Multifocal ischemic brain infarcts Suspecting embolic stroke Twitching and lethargy  Stroke workup neg so far.  Ongoing multifactorial encephalopathy related to respiratory and cardiac failure is improving. TEE once more stable, and cardiac monitor at DC. High risk for hemorrhagic conversion. Goal to gradually normalize BP, but has been persistently hypertensive, but better controlled today with medication adjustment this week.  - Continue hydralazine 50 tid  - Continue Bidil  - Continue milrinone  - Continue Heparin infusion; switch to NOAC at DC - Continue crestor 20 mg - Telemetry - Frequent neuro checks  - PT/OT/SLP   Mild nonocclusive pulmonary emboli  Source unclear, with hypercoagulably workup largely unremarkable till now, including factor 5 Leiden; prothrombin gene mutation pending. TTE LVEF 15-20% with severely decreased left ventricle function. - TEE pending clinical stability  - Continue on heparin infusion, will likely need DOAC loading dose - Follow-up on prothrombin gene mutation    AoC HFrEF exacerbation Nonischemic hypertensive cardiomyopathy Acute pulmonary edema in the s/o hypertensive crisis Uncontrolled HTN Improved diuresis today; put out 3.8L with net down 1.6 L. LE edema much improved. Optimizing medications to better control BP, and at goal today. Currently not candidate for advanced therapies given comorbidities.  - Advanced HF team following, appreciate assistance   - Continue IV Lasix 80 mg bid - Continue Milrinone  - Continue Bidil  - Hydralazine 50 tid - Amiodarone for PVCs - Holding BB in s/o HF and ARB d/t AKI - Daily weights and I/O's - Cardiac monitoring    AKI on CKD3b Likely cardiorenal from low output HF. Baseline Cr 1.6-1.8. Cr been stable near 2 this admission.   - Continue IV Lasix as  above - Trend BMP - Avoid nephrotoxins    Stable ascending thoracic aortic dilatation  4.2 cm in size. - Annual imaging follow-up recommended by CTA or MRA    Best Practice: Diet: Dysphagia 3  IVF: None, None VTE: Heparin Code: Full PT/OT: CIR  [name],  MD  Internal Medicine Resident, PGY-1 Pager: [phone] After 5pm on weekdays and 1pm on weekends: On Call pager [phone]

## 2021-10-18 NOTE — Progress Notes (Signed)
Physical Therapy Treatment Patient Details Name: Oscar Rosales MRN: [id number] DOB: 07-08-1966 Today's Date: 10/18/2021   History of Present Illness Pt is a 56 y/o male admitted secondary to fatigue and syncopal episode at work. 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 Pt also found to have mild nonocclusive pulmonary emboli and started on Heparin drip. PMH including but not limited to HFrEF (EF 25-30% 02/2020), HLD, HTN, CAD, and CKD3b.    PT Comments    Pt making steady progress with mobility. Pt motivated to improve mobility and return home. Recommend acute inpatient rehab for further therapy.    Recommendations for follow up therapy are one component of a multi-disciplinary discharge planning process, led by the attending physician.  Recommendations may be updated based on patient status, additional functional criteria and insurance authorization.  Follow Up Recommendations  Acute inpatient rehab (3hours/day)     Assistance Recommended at Discharge Frequent or constant Supervision/Assistance  Patient can return home with the following A lot of help with walking and/or transfers;A lot of help with bathing/dressing/bathroom;Assistance with cooking/housework;Direct supervision/assist for medications management;Assist for transportation;Help with stairs or ramp for entrance   Equipment Recommendations  Rolling walker (2 wheels);Wheelchair (measurements PT);Wheelchair cushion (measurements PT)    Recommendations for Other Services       Precautions / Restrictions Precautions Precautions: Fall;Other (comment) Precaution Comments: watch HR, BP     Mobility  Bed Mobility Overal bed mobility: Needs Assistance Bed Mobility: Supine to Sit     Supine to sit: Min  assist     General bed mobility comments: Assist to bring RLE off of bed and elevate trunk into sitting    Transfers Overall transfer level: Needs assistance Equipment used: Rolling walker (2 wheels) Transfers: Sit to/from Stand Sit to Stand: Min assist, +2 safety/equipment           General transfer comment: Assist to bring hips up and for balance. Assisted pt to place rt hand on walker prior to standing    Ambulation/Gait Ambulation/Gait assistance: Min assist, +2 safety/equipment Gait Distance (Feet): 90 Feet (90' x 1, 45' x 1) Assistive device: Rolling walker (2 wheels) Gait Pattern/deviations: Step-through pattern, Decreased step length - right, Decreased dorsiflexion - right Gait velocity: decr Gait velocity interpretation: <1.8 ft/sec, indicate of risk for recurrent falls   General Gait Details: Assist with balance and support. Verbal cues to incr step length and rt and to make sure it completed swing through prior to stepping again with lt   Stairs             Wheelchair Mobility    Modified Rankin (Stroke Patients Only) Modified Rankin (Stroke Patients Only) Pre-Morbid Rankin Score: No symptoms Modified Rankin: Moderately severe disability     Balance Overall balance assessment: Needs assistance Sitting-balance support: Feet supported Sitting balance-Leahy Scale: Fair     Standing balance support: Bilateral upper extremity supported Standing balance-Leahy Scale: Poor Standing balance comment: walker and min assist fro static standing                            Cognition Arousal/Alertness: Awake/alert Behavior During Therapy: Flat affect Overall Cognitive Status: Impaired/Different from baseline Area of Impairment: Attention, Memory, Following commands, Safety/judgement, Awareness, Problem solving  Current Attention Level: Selective Memory: Decreased short-term memory Following Commands: Follows one step  commands consistently Safety/Judgement: Decreased awareness of safety, Decreased awareness of deficits Awareness: Emergent Problem Solving: Slow processing, Requires verbal cues          Exercises      General Comments        Pertinent Vitals/Pain Pain Assessment Pain Assessment: No/denies pain    Home Living                          Prior Function            PT Goals (current goals can now be found in the care plan section) Acute Rehab PT Goals Patient Stated Goal: to get stronger Progress towards PT goals: Progressing toward goals    Frequency    Min 4X/week      PT Plan Current plan remains appropriate    Co-evaluation              AM-PAC PT "6 Clicks" Mobility   Outcome Measure  Help needed turning from your back to your side while in a flat bed without using bedrails?: A Little Help needed moving from lying on your back to sitting on the side of a flat bed without using bedrails?: A Little Help needed moving to and from a bed to a chair (including a wheelchair)?: A Little Help needed standing up from a chair using your arms (e.g., wheelchair or bedside chair)?: A Little Help needed to walk in hospital room?: A Lot Help needed climbing 3-5 steps with a railing? : Total 6 Click Score: 15    End of Session Equipment Utilized During Treatment: Gait belt;Oxygen Activity Tolerance: Patient tolerated treatment well Patient left: in chair;with call bell/phone within reach;with chair alarm set;with family/visitor present Nurse Communication: Mobility status PT Visit Diagnosis: Other abnormalities of gait and mobility (R26.89)     Time: [id number] PT Time Calculation (min) (ACUTE ONLY): 30 min  Charges:  $Gait Training: 23-37 mins                     [place] Pager [phone] [place] 10/18/2021, 12:04 PM

## 2021-10-18 NOTE — TOC Progression Note (Signed)
Transition of Care [facility]) - Progression Note    Patient Details  Name: Oscar Rosales MRN: [id number] Date of Birth: 27-Jun-1966  Transition of Care [facility]) CM/SW Contact  Heba Ige, LCSW Phone Number: 10/18/2021, 12:35 PM  Clinical Narrative:    Sharlynn Oliphant with [facility] IP rehab is following from a distance to see if Oscar Rosales would be a good candidate if CIR in the hospital is unable to offer a bed at time of discharge. Olegario Messier at [facility] also reported that she can extend a bed offer to Oscar Rosales at time of discharge. Awaiting patient progress and medical readiness for disposition.   CSW will continue to follow throughout discharge.   Expected Discharge Plan: IP Rehab Facility Barriers to Discharge: Continued Medical Work up  Expected Discharge Plan and Services Expected Discharge Plan: IP Rehab Facility In-house Referral: Clinical Social Work Discharge Planning Services: CM Consult Post Acute Care Choice: IP Rehab Living arrangements for the past 2 months: Single Family Home                                       Social Determinants of Health (SDOH) Interventions Food Insecurity Interventions: Intervention Not Indicated Housing Interventions: Intervention Not Indicated  Readmission Risk Interventions No flowsheet data found.  Julen Rubert, MSW, LCSWA [phone] Heart Failure Social Worker

## 2021-10-19 DIAGNOSIS — I2699 Other pulmonary embolism without acute cor pulmonale: Secondary | ICD-10-CM | POA: Diagnosis not present

## 2021-10-19 DIAGNOSIS — I255 Ischemic cardiomyopathy: Secondary | ICD-10-CM

## 2021-10-19 DIAGNOSIS — I5023 Acute on chronic systolic (congestive) heart failure: Secondary | ICD-10-CM | POA: Diagnosis not present

## 2021-10-19 DIAGNOSIS — I639 Cerebral infarction, unspecified: Secondary | ICD-10-CM | POA: Diagnosis not present

## 2021-10-19 DIAGNOSIS — I130 Hypertensive heart and chronic kidney disease with heart failure and stage 1 through stage 4 chronic kidney disease, or unspecified chronic kidney disease: Secondary | ICD-10-CM | POA: Diagnosis not present

## 2021-10-19 DIAGNOSIS — I428 Other cardiomyopathies: Secondary | ICD-10-CM | POA: Diagnosis not present

## 2021-10-19 LAB — CBC
HCT: 38 % — ABNORMAL LOW (ref 39.0–52.0)
Hemoglobin: 12.9 g/dL — ABNORMAL LOW (ref 13.0–17.0)
MCH: 32.1 pg (ref 26.0–34.0)
MCHC: 33.9 g/dL (ref 30.0–36.0)
MCV: 94.5 fL (ref 80.0–100.0)
Platelets: 137 10*3/uL — ABNORMAL LOW (ref 150–400)
RBC: 4.02 MIL/uL — ABNORMAL LOW (ref 4.22–5.81)
RDW: 14.5 % (ref 11.5–15.5)
WBC: 9.5 10*3/uL (ref 4.0–10.5)
nRBC: 0 % (ref 0.0–0.2)

## 2021-10-19 LAB — BASIC METABOLIC PANEL
Anion gap: 9 (ref 5–15)
BUN: 36 mg/dL — ABNORMAL HIGH (ref 6–20)
CO2: 29 mmol/L (ref 22–32)
Calcium: 8.4 mg/dL — ABNORMAL LOW (ref 8.9–10.3)
Chloride: 97 mmol/L — ABNORMAL LOW (ref 98–111)
Creatinine, Ser: 2.02 mg/dL — ABNORMAL HIGH (ref 0.61–1.24)
GFR, Estimated: 38 mL/min — ABNORMAL LOW (ref >60)
Glucose, Bld: 84 mg/dL (ref 70–99)
Potassium: 3.5 mmol/L (ref 3.5–5.1)
Sodium: 135 mmol/L (ref 135–145)

## 2021-10-19 LAB — HEPARIN LEVEL (UNFRACTIONATED): Heparin Unfractionated: 0.22 IU/mL — ABNORMAL LOW (ref 0.30–0.70)

## 2021-10-19 LAB — COOXEMETRY PANEL
Carboxyhemoglobin: 1.3 % (ref 0.5–1.5)
Methemoglobin: 1 % (ref 0.0–1.5)
O2 Saturation: 77 %
Total hemoglobin: 11.5 g/dL — ABNORMAL LOW (ref 12.0–16.0)

## 2021-10-19 MED ORDER — POTASSIUM CHLORIDE CRYS ER 20 MEQ PO TBCR
40.0000 meq | EXTENDED_RELEASE_TABLET | Freq: Once | ORAL | Status: AC
  Administered 2021-10-19: 40 meq via ORAL
  Filled 2021-10-19: qty 2

## 2021-10-19 MED ORDER — APIXABAN 5 MG PO TABS
5.0000 mg | ORAL_TABLET | Freq: Two times a day (BID) | ORAL | Status: DC
  Administered 2021-10-19 – 2021-10-25 (×13): 5 mg via ORAL
  Filled 2021-10-19 (×13): qty 1

## 2021-10-19 MED ORDER — APIXABAN 2.5 MG PO TABS: 2.5000 mg | ORAL_TABLET | Freq: Two times a day (BID) | ORAL | Status: DC

## 2021-10-19 NOTE — Progress Notes (Signed)
Physical Therapy Treatment Patient Details Name: Oscar Rosales MRN: [id number] DOB: 1965-12-20 Today's Date: 10/19/2021   History of Present Illness Pt is a 56 y/o male admitted secondary to fatigue and syncopal episode at work. 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 Pt also found to have mild nonocclusive pulmonary emboli and started on Heparin drip. PMH including but not limited to HFrEF (EF 25-30% 02/2020), HLD, HTN, CAD, and CKD3b.    PT Comments    Focused session on gait and transfer training along with lower and upper extremity exercises. Pt with significant visual deficits, impacting his bil peripheral vision especially a his R inferior lateral visual zone. This along with his memory and awareness deficits impact his safety greatly as he continually bumps into objects, especially on his R. Educated pt on lighthouse scanning for obstacles, but no carryover noted. Pt drifts to the R and has difficulty advancing and clearing his bil legs (R>L). R hand also with some edema and R UE fatigues quicker than L with exercises. Will continue to follow acutely. Current recommendations remain appropriate.   Recommendations for follow up therapy are one component of a multi-disciplinary discharge planning process, led by the attending physician.  Recommendations may be updated based on patient status, additional functional criteria and insurance authorization.  Follow Up Recommendations  Acute inpatient rehab (3hours/day)     Assistance Recommended at Discharge Frequent or constant Supervision/Assistance  Patient can return home with the following A lot of help with walking and/or transfers;A lot of help with bathing/dressing/bathroom;Assistance with cooking/housework;Direct  supervision/assist for medications management;Assist for transportation;Help with stairs or ramp for entrance;Direct supervision/assist for financial management   Equipment Recommendations  Rolling walker (2 wheels);Wheelchair (measurements PT);Wheelchair cushion (measurements PT);BSC/3in1    Recommendations for Other Services       Precautions / Restrictions Precautions Precautions: Fall;Other (comment) Precaution Comments: watch HR, BP Restrictions Weight Bearing Restrictions: No     Mobility  Bed Mobility Overal bed mobility: Needs Assistance Bed Mobility: Supine to Sit     Supine to sit: Min guard, HOB elevated     General bed mobility comments: HOB elevated, pt exiting L side of bed with extra time and min hguard for safety.    Transfers Overall transfer level: Needs assistance Equipment used: Rolling walker (2 wheels), 1 person hand held assist Transfers: Sit to/from Stand Sit to Stand: Min assist           General transfer comment: Pt trying to stand from EOB with R hip posterior to L and difficulty noted with initiation, cued pt to scoot bil hips anteriorly. MinA to power up to stand from EOB 1x and from recliner 3x.    Ambulation/Gait Ambulation/Gait assistance: Min assist Gait Distance (Feet): 90 Feet Assistive device: Rolling walker (2 wheels) Gait Pattern/deviations: Step-through pattern, Decreased step length - right, Decreased dorsiflexion - right, Drifts right/left, Leaning posteriorly (drifts R) Gait velocity: decr Gait velocity interpretation: <1.31 ft/sec, indicative of household ambulator   General Gait Details: Pt with decreased R step length and bil feet clearance (R>L), needing verbal and tactile cues to correct momentarily. Pt missing grip on RW with bil hands, needing cues to look at both and place correctly. Pt drifts to R with no awareness of obstacles in bil peripheral zones, but especially to R inferior lateral  zone. Needs repeated cues to  scan surroundings. Mod-max cues and needs asisstance to keep front of RW on ground often. Stumbles often, minA to maintain balance.   Stairs             Wheelchair Mobility    Modified Rankin (Stroke Patients Only) Modified Rankin (Stroke Patients Only) Pre-Morbid Rankin Score: No symptoms Modified Rankin: Moderately severe disability     Balance Overall balance assessment: Needs assistance Sitting-balance support: Feet supported Sitting balance-Leahy Scale: Fair     Standing balance support: Bilateral upper extremity supported Standing balance-Leahy Scale: Poor Standing balance comment: Reliant on UE support                            Cognition Arousal/Alertness: Awake/alert Behavior During Therapy: Flat affect Overall Cognitive Status: Impaired/Different from baseline Area of Impairment: Attention, Memory, Following commands, Safety/judgement, Awareness, Problem solving, Orientation                 Orientation Level: Disoriented to, Situation Current Attention Level: Selective Memory: Decreased short-term memory, Decreased recall of precautions Following Commands: Follows one step commands consistently, Follows one step commands with increased time, Follows multi-step commands inconsistently Safety/Judgement: Decreased awareness of safety, Decreased awareness of deficits Awareness: Emergent Problem Solving: Slow processing, Requires verbal cues General Comments: Not oriented to situation. Repeated cues to scan like a lighthouse to compensate for visual deficits, but no recall or carryover noted. Increased time needed to process all cues.        Exercises General Exercises - Upper Extremity Shoulder Flexion: AROM, Strengthening, Both, Other reps (comment), Seated (> 5 on L, ~5 on R, fatigued on R) General Exercises - Lower Extremity Long Arc Quad: AROM, Strengthening, Both, 15 reps, Seated Hip Flexion/Marching: AROM, Strengthening, Both, 10  reps, Seated    General Comments General comments (skin integrity, edema, etc.): VSS on 2L      Pertinent Vitals/Pain Pain Assessment Pain Assessment: Faces Faces Pain Scale: No hurt Pain Intervention(s): Monitored during session    Home Living                          Prior Function            PT Goals (current goals can now be found in the care plan section) Acute Rehab PT Goals Patient Stated Goal: to improve PT Goal Formulation: With patient/family Time For Goal Achievement: 10/27/21 Potential to Achieve Goals: Good Progress towards PT goals: Progressing toward goals    Frequency    Min 4X/week      PT Plan Equipment recommendations need to be updated    Co-evaluation              AM-PAC PT "6 Clicks" Mobility   Outcome Measure  Help needed turning from your back to your side while in a flat bed without using bedrails?: A Little Help needed moving from lying on your back to sitting on the side of a flat bed without using bedrails?: A Little Help needed moving to and from a bed to a chair (including a wheelchair)?: A Little Help needed standing up from a chair using your arms (e.g., wheelchair or bedside chair)?: A Little Help needed to walk in hospital room?: A Lot Help needed climbing 3-5 steps with a railing? : Total 6 Click Score: 15    End of Session Equipment Utilized During Treatment: Gait belt;Oxygen Activity Tolerance: Patient tolerated  treatment well Patient left: in chair;with call bell/phone within reach;with chair alarm set;with family/visitor present Nurse Communication: Mobility status PT Visit Diagnosis: Other abnormalities of gait and mobility (R26.89);Unsteadiness on feet (R26.81);Muscle weakness (generalized) (M62.81);Difficulty in walking, not elsewhere classified (R26.2);Other symptoms and signs involving the nervous system (R29.898)     Time: [id number] PT Time Calculation (min) (ACUTE ONLY): 46 min  Charges:  $Gait  Training: 8-22 mins $Therapeutic Exercise: 8-22 mins $Therapeutic Activity: 8-22 mins                     Moishe Spice, PT, DPT Acute Rehabilitation Services  Pager: [phone] Office: [place] 10/19/2021, 5:41 PM

## 2021-10-19 NOTE — Progress Notes (Signed)
Advanced Heart Failure Rounding Note  PCP-Cardiologist: None   Subjective:    1/30: DBA started for low output, initial Co-ox 44%  2/1 DBA stopped and switched to milrinone due to hypertension  On Milrinone 0.25 mcg and IV lasix. Weight down 6 pounds over night with IV diuresis.   More alert today. Breathing better. BP still up but improving. Denies SOB, orthopnea or PND. CVP 9. Co-ox 77%  SCr 2.1 -> 2.0  Objective:   Weight Range: 60.6 kg Body mass index is 18.63 kg/m.   Vital Signs:   Temp:  [97.6 F (36.4 C)-98.2 F (36.8 C)] 97.7 F (36.5 C) (02/04 1108) Pulse Rate:  [79-100] 87 (02/04 1108) Resp:  [14-18] 14 (02/04 1108) BP: (130-168)/(94-122) 168/102 (02/04 1108) SpO2:  [97 %-99 %] 97 % (02/04 1108) Weight:  [60.6 kg] 60.6 kg (02/04 0316) Last BM Date: 10/16/21  Weight change: Filed Weights   10/17/21 0500 10/18/21 0527 10/19/21 0316  Weight: 63.1 kg 63.1 kg 60.6 kg    Intake/Output:   Intake/Output Summary (Last 24 hours) at 10/19/2021 1326 Last data filed at 10/19/2021 1200 Gross per 24 hour  Intake 2816.54 ml  Output 3750 ml  Net -933.46 ml       Physical Exam   General:  Sitting up in bed  No resp difficulty HEENT: normal R ptosis Neck: supple. JVP 9. Carotids 2+ bilat; no bruits. No lymphadenopathy or thryomegaly appreciated. Cor: PMI nondisplaced. Regular rate & rhythm. No rubs, gallops or murmurs. Lungs: clear Abdomen: soft, nontender, nondistended. No hepatosplenomegaly. No bruits or masses. Good bowel sounds. Extremities: no cyanosis, clubbing, rash, edema Neuro: alert & orientedx3 weak on R  Telemetry   Sinus 90-100 Personally reviewed  Labs    CBC Recent Labs    10/18/21 0421 10/19/21 0440  WBC 11.2* 9.5  HGB 12.9* 12.9*  HCT 39.2 38.0*  MCV 95.4 94.5  PLT 124* 137*    Basic Metabolic Panel Recent Labs    10/17/21 0550 10/18/21 0421 10/19/21 0440  NA 141 139 135  K 3.5 3.3* 3.5  CL 103 102 97*  CO2 26 27 29    GLUCOSE 108* 145* 84  BUN 38* 36* 36*  CREATININE 2.03* 2.15* 2.02*  CALCIUM 8.7* 8.2* 8.4*  MG 2.0  --   --     Liver Function Tests No results for input(s): AST, ALT, ALKPHOS, BILITOT, PROT, ALBUMIN in the last 72 hours. No results for input(s): LIPASE, AMYLASE in the last 72 hours. Cardiac Enzymes No results for input(s): CKTOTAL, CKMB, CKMBINDEX, TROPONINI in the last 72 hours.  BNP: BNP (last 3 results) Recent Labs    10/11/21 1245  BNP 3,942.1*     ProBNP (last 3 results) No results for input(s): PROBNP in the last 8760 hours.   D-Dimer No results for input(s): DDIMER in the last 72 hours. Hemoglobin A1C No results for input(s): HGBA1C in the last 72 hours. Fasting Lipid Panel No results for input(s): CHOL, HDL, LDLCALC, TRIG, CHOLHDL, LDLDIRECT in the last 72 hours. Thyroid Function Tests Recent Labs    10/17/21 1358  TSH 3.545  T3FREE 1.8*     Other results:   Imaging    No results found.   Medications:     Scheduled Medications:  amiodarone  200 mg Oral BID   Chlorhexidine Gluconate Cloth  6 each Topical Daily   furosemide  80 mg Intravenous BID   isosorbide-hydrALAZINE  2 tablet Oral TID   rosuvastatin  20  mg Oral Daily   sodium chloride flush  10-40 mL Intracatheter Q12H    Infusions:  sodium chloride     heparin 1,450 Units/hr (10/19/21 0722)   milrinone 0.25 mcg/kg/min (10/18/21 1445)    PRN Medications: acetaminophen **OR** acetaminophen (TYLENOL) oral liquid 160 mg/5 mL **OR** acetaminophen, guaiFENesin-dextromethorphan, senna-docusate, sodium chloride flush    Patient Profile   56 y.o. male with hx chronic systolic CHF/NICM, HTN, CKD IIIb, hx hepatitis C infection, noncompliance with medical therapy. Admitted with CVA, PE and acute on chronic systolic CHF w/ low output. Has been followed at [facility].  Echo with biventricular hypertrophy/dysfunction EF 15-20% Moderate MR moderate RV dysfunction.  Assessment/Plan   Acute  on chronic systolic CHF/NICM: -Diagnosed 2018. Normal coronaries on LHC in 2019. Felt to be due to uncontrolled hypertension. -EF previously recovered to 45-50%.  -EF down to 25-30% in 06/21 in setting of uncontrolled HTN.  -Echo 01/23: EF 15-20%, RV moderately reduced, severe biatrial enlargement, moderate MR, moderate TR, moderate AI -Volume overloaded w/ low output, initial co-ox 44%. Earlier this week DBA stopped and switched to milrinone 0.25 mcg.  - CO -OX 77%. Will cut milrinone to 0.215 mcg.  - Volume status improving but still overloaded. Continue to diurese with IV lasix. - No beta blocker with a/c CHF -Now off losartan with AKI on CKD. SCr now stable. If SCr stable tomorrow will add low-dose Entresto.  -Eventual SGLT2i. A1c 5.2% - Continue bidil 2 tabs thee times a day.   Will check on insurance coverage. CO-Pay 47.00 may need to switch to hydralazine/imdur at d/c -Currently not candidate for advanced therapies currently d/t noncompliance and renal impairment.   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 Scattered infarcts on MRI brain -No large vessel occlusion on CTA head and neck -Negative bubble study on echo - MRI- Scattered infarcts involving the right cerebellar hemisphere, left cerebral peduncle, left hippocampus, left thalamus, right occipital lobe, and left pre and postcentral gyri, some of which appear acute while others appear more subacute in chronicity. - On statin.   - Neuro signed off.  PT/OT following.   3. PE: -Incidentally noted on CTA chest -On heparin gtt. Will switch to Eliquis  -LE dopplers negative for DVT -Hypercoagulable labs pending   4. AKI on CKD IIIb: -Baseline Scr 1.7 -SCr peaked to 2.22 -->today 2.05-->2.03 -->2.15 -> 2.0 -Renal US c/w medical renal disease. -suspect cardiorenal from low output HF  5. PVCs /NSVT Placed on amio drip 2/2 due to increased PVC burden. PVC suppressed today < 5 per hour. Stop amio drip and start amio 200 mg twice a day.   6.  HTN - Uncontrolled. - Improved on Bidil but BP still up  - If SCr stable tomorrow will add low-dose Entresto. If rising, consider amlodipine  PT recommending CIR.    Length of Stay: [place], MD  10/19/2021, 1:26 PM  Advanced Heart Failure Team Pager [phone] (M-F; [place])  Please contact [facility] for night-coverage after hours (5p -7a ) and weekends on [email or web address]

## 2021-10-19 NOTE — Progress Notes (Signed)
Lab called to verify that heparin is infusing and that was no pauses.  I relayed that I had not been informed of any pauses in the rate and that it was running through his peripheral.  His PIV flushes without swelling, redness, pain or leaking.  However, there is not blood return.  Because of this, I moved his heparin to his PICC.

## 2021-10-19 NOTE — Progress Notes (Signed)
ANTICOAGULATION CONSULT NOTE - Follow Up Consult  Pharmacy Consult for heparin Indication: pulmonary embolus and stroke  Labs: Recent Labs    10/17/21 0550 10/18/21 0421 10/18/21 0544 10/18/21 0712 10/18/21 1922 10/19/21 0440 10/19/21 0625  HGB 14.5 12.9*  --   --   --  12.9*  --   HCT 43.3 39.2  --   --   --  38.0*  --   PLT 152 124*  --   --   --  137*  --   HEPARINUNFRC 0.44 <0.10*   < > 0.10* <0.10*  --  0.22*  CREATININE 2.03* 2.15*  --   --   --  2.02*  --    < > = values in this interval not displayed.    Assessment: 56yo male subtherapeutic on heparin after rate change though now approaching goal; no infusion issues or signs of bleeding per RN.  Goal of Therapy:  Heparin level 0.3-0.5 units/ml   Plan:  Will increase heparin infusion by 2 units/kg/hr to 1450 units/hr and check level in 8 hours.    Wynona Neat, PharmD, BCPS  10/19/2021,7:03 AM

## 2021-10-19 NOTE — Progress Notes (Addendum)
Subjective: No acute overnight events.   Patient is seen at bedside during rounds this morning. Sleepy but wakes up.     Stable mental status with no new acute focal deficits.   Objective:  Vital signs in last 24 hours: Vitals:   10/18/21 2300 10/19/21 0316 10/19/21 0738 10/19/21 1108  BP: (!) 132/97 (!) 130/94 (!) 146/100 (!) 168/102  Pulse: 93 82 79 87  Resp: 17 17 14 14   Temp: 97.9 F (36.6 C) 98.2 F (36.8 C) 97.6 F (36.4 C) 97.7 F (36.5 C)  TempSrc: Oral Oral Oral Oral  SpO2: 97% 99% 98% 97%  Weight:  60.6 kg    Height:       Constitutional: sleepy but wakes up to voice  HENT: normocephalic, atraumatic, mucous membranes moist Eyes: Dilated pupil on the left side. Intranuclear ophthalmoplegia noted. Cardiovascular: RRR, no m/r/g, 1+ bilateral LE edema  Pulmonary/Chest: diminished breath sounds bilaterally.  Extremities: No Twitching of the extremities noted   Neurological: A&O to person placed and situation, not to time.   Bilateral ptosis is present.  4/5 strength of right upper and lower extremity 5/5 strength of left upper and lower extremities Lack of coordination of fine motor movements of the right hand   Assessment/Plan:  Principal Problem:   Acute CVA (cerebrovascular accident) ([place]) Active Problems:   Hypertension, uncontrolled   Non-ischemic cardiomyopathy ([place])   Stage 3 chronic kidney disease ([place])   Acute pulmonary embolism (HCC)  Oscar Rosales is a 56 y.o. male with a pertinent PMH of nonischemic cardiomyopathy, HFrEF (EF 25-30% 02/2020), HLD, HTN, and CKD3b admitted for acute ischemic stroke 2/2 embolic infarctions, mild nonocclusive pulmonary emboli, and acute on chronic HFrEF exacerbation.  Multifocal ischemic brain infarcts Suspecting embolic stroke Twitching and lethargy, improved  Ongoing multifactorial encephalopathy related to respiratory and cardiac failure is stable/improving.  TEE once more stable, and cardiac monitor at DC Goal to  gradually normalize BP given high risk for hemorrhagic conversion, but remains high- AHF team adjusting medications and doses.    - Continue Bidil tid  - Continue milrinone; dose reduction today with plan to wean  - Started Entresto 24/26 bid  - Plan to start spiro tmrw if Cr stable  - Continue Heparin infusion; switch to NOAC at DC - Continue crestor 20 mg - Telemetry - Frequent neuro checks  - PT/OT/SLP   Mild nonocclusive pulmonary emboli, source unclear  Hypercoagulably workup largely unremarkable till now, including factor 5 Leiden; prothrombin gene mutation pending. TTE LVEF 15-20% with severely decreased left ventricle function.  - TEE pending clinical stability  - Started on Eliquis 2/4 per cardiology - Follow-up on prothrombin gene mutation    AoC HFrEF exacerbation Nonischemic hypertensive cardiomyopathy Acute pulmonary edema in the s/o hypertensive crisis Uncontrolled HTN Diuresing slowly; weight improved from 77.1 kg (admission, 1/27) to 58.4 kg (today), and net out 4.9 during admission. Hypervolemia/LE edema much improved. Optimizing medications to better control BP.  - Advanced HF team following, appreciate assistance   - Holding further lasix - Continue Milrinone - Continue Bidil  - Started Entresto 24/26 bid  - Plan to start spiro tmrw if Cr stable  - Amiodarone for PVCs - Holding BB in s/o acute on chronic HF - Daily weights and I/O's - Cardiac monitoring    AKI on CKD3b Likely cardiorenal from low output HF. Baseline Cr 1.6-1.8. Cr been stable near 2 this admission. May be new baseline given no significant change with aggressive diuresis and improvement in  CVP/volume status.  - Hold further diuresis - Trend BMP - Avoid nephrotoxins    Stable ascending thoracic aortic dilatation  4.2 cm in size. - Annual imaging follow-up recommended by CTA or MRA    Best Practice: Diet: Dysphagia 3  IVF: None, None VTE: Heparin Code: Full PT/OT: CIR  Oscar Manes, MD   Internal Medicine Resident, PGY-1 Pager: [phone] After 5pm on weekdays and 1pm on weekends: On Call pager [phone]

## 2021-10-20 DIAGNOSIS — I2699 Other pulmonary embolism without acute cor pulmonale: Secondary | ICD-10-CM | POA: Diagnosis not present

## 2021-10-20 DIAGNOSIS — I509 Heart failure, unspecified: Secondary | ICD-10-CM

## 2021-10-20 DIAGNOSIS — I5023 Acute on chronic systolic (congestive) heart failure: Secondary | ICD-10-CM | POA: Diagnosis not present

## 2021-10-20 DIAGNOSIS — I130 Hypertensive heart and chronic kidney disease with heart failure and stage 1 through stage 4 chronic kidney disease, or unspecified chronic kidney disease: Secondary | ICD-10-CM | POA: Diagnosis not present

## 2021-10-20 DIAGNOSIS — I639 Cerebral infarction, unspecified: Secondary | ICD-10-CM | POA: Diagnosis not present

## 2021-10-20 LAB — BASIC METABOLIC PANEL
Anion gap: 12 (ref 5–15)
BUN: 33 mg/dL — ABNORMAL HIGH (ref 6–20)
CO2: 32 mmol/L (ref 22–32)
Calcium: 8.6 mg/dL — ABNORMAL LOW (ref 8.9–10.3)
Chloride: 91 mmol/L — ABNORMAL LOW (ref 98–111)
Creatinine, Ser: 1.95 mg/dL — ABNORMAL HIGH (ref 0.61–1.24)
GFR, Estimated: 40 mL/min — ABNORMAL LOW (ref >60)
Glucose, Bld: 122 mg/dL — ABNORMAL HIGH (ref 70–99)
Potassium: 3.3 mmol/L — ABNORMAL LOW (ref 3.5–5.1)
Sodium: 135 mmol/L (ref 135–145)

## 2021-10-20 LAB — CBC
HCT: 39.5 % (ref 39.0–52.0)
Hemoglobin: 13.4 g/dL (ref 13.0–17.0)
MCH: 31.8 pg (ref 26.0–34.0)
MCHC: 33.9 g/dL (ref 30.0–36.0)
MCV: 93.8 fL (ref 80.0–100.0)
Platelets: 170 10*3/uL (ref 150–400)
RBC: 4.21 MIL/uL — ABNORMAL LOW (ref 4.22–5.81)
RDW: 14.2 % (ref 11.5–15.5)
WBC: 7.6 10*3/uL (ref 4.0–10.5)
nRBC: 0 % (ref 0.0–0.2)

## 2021-10-20 LAB — MAGNESIUM: Magnesium: 1.8 mg/dL (ref 1.7–2.4)

## 2021-10-20 LAB — COOXEMETRY PANEL
Carboxyhemoglobin: 1.3 % (ref 0.5–1.5)
Methemoglobin: 0.9 % (ref 0.0–1.5)
O2 Saturation: 76.8 %
Total hemoglobin: 12.9 g/dL (ref 12.0–16.0)

## 2021-10-20 MED ORDER — POTASSIUM CHLORIDE CRYS ER 20 MEQ PO TBCR: 40.0000 meq | EXTENDED_RELEASE_TABLET | Freq: Once | ORAL | Status: DC

## 2021-10-20 MED ORDER — MILRINONE LACTATE IN DEXTROSE 20-5 MG/100ML-% IV SOLN
0.1250 ug/kg/min | INTRAVENOUS | Status: DC
  Administered 2021-10-20: 0.125 ug/kg/min via INTRAVENOUS
  Filled 2021-10-20: qty 100

## 2021-10-20 MED ORDER — MAGNESIUM SULFATE 2 GM/50ML IV SOLN
2.0000 g | Freq: Once | INTRAVENOUS | Status: AC
  Administered 2021-10-20: 2 g via INTRAVENOUS
  Filled 2021-10-20: qty 50

## 2021-10-20 MED ORDER — POTASSIUM CHLORIDE 20 MEQ PO PACK
40.0000 meq | PACK | Freq: Once | ORAL | Status: AC
  Administered 2021-10-20: 40 meq via ORAL
  Filled 2021-10-20: qty 2

## 2021-10-20 NOTE — Progress Notes (Signed)
Patient ID: Oscar Rosales, male   DOB: 08/12/1966, 56 y.o.   MRN: [id number]     Advanced Heart Failure Rounding Note  PCP-Cardiologist: None   Subjective:    1/30: DBA started for low output, initial Co-ox 44%  2/1 DBA stopped and switched to milrinone due to hypertension  On Milrinone 0.25 mcg and IV lasix. I/Os look incomplete but weight down 5 lbs. Co-ox 77% with CVP 2. Creatinine trending down, 1.95 today. BP remains high.   He is sleeping/drowsy but will wake up and answer questions. Denies dyspnea.    SCr 2.1 -> 2.0 -> 1.95  Objective:   Weight Range: 58.4 kg Body mass index is 17.96 kg/m.   Vital Signs:   Temp:  [97.5 F (36.4 C)-98.2 F (36.8 C)] 97.5 F (36.4 C) (02/05 0825) Pulse Rate:  [59-87] 82 (02/05 0825) Resp:  [14-20] 19 (02/05 0825) BP: (125-168)/(87-102) 140/100 (02/05 0825) SpO2:  [96 %-100 %] 100 % (02/05 0825) Weight:  [58.4 kg] 58.4 kg (02/05 0405) Last BM Date: 10/16/21  Weight change: Filed Weights   10/18/21 0527 10/19/21 0316 10/20/21 0405  Weight: 63.1 kg 60.6 kg 58.4 kg    Intake/Output:   Intake/Output Summary (Last 24 hours) at 10/20/2021 0933 Last data filed at 10/20/2021 0400 Gross per 24 hour  Intake 990.06 ml  Output 600 ml  Net 390.06 ml      Physical Exam   General: NAD Neck: No JVD, no thyromegaly or thyroid nodule.  Lungs: Clear to auscultation bilaterally with normal respiratory effort. CV: Nondisplaced PMI.  Heart regular S1/S2, no S3/S4, no murmur.  No peripheral edema.   Abdomen: Soft, nontender, no hepatosplenomegaly, no distention.  Skin: Intact without lesions or rashes.  Neurologic: Drowsy/sleeping but wakes up.   Psych: Normal affect. Extremities: No clubbing or cyanosis.  HEENT: Normal.    Telemetry   Sinus 80s with 1 run of NSVT Personally reviewed  Labs    CBC Recent Labs    10/19/21 0440 10/20/21 0500  WBC 9.5 7.6  HGB 12.9* 13.4  HCT 38.0* 39.5  MCV 94.5 93.8  PLT 137* [id number]   Basic  Metabolic Panel Recent Labs    10/19/21 0440 10/20/21 0500  NA 135 135  K 3.5 3.3*  CL 97* 91*  CO2 29 32  GLUCOSE 84 122*  BUN 36* 33*  CREATININE 2.02* 1.95*  CALCIUM 8.4* 8.6*  MG  --  1.8   Liver Function Tests No results for input(s): AST, ALT, ALKPHOS, BILITOT, PROT, ALBUMIN in the last 72 hours. No results for input(s): LIPASE, AMYLASE in the last 72 hours. Cardiac Enzymes No results for input(s): CKTOTAL, CKMB, CKMBINDEX, TROPONINI in the last 72 hours.  BNP: BNP (last 3 results) Recent Labs    10/11/21 1245  BNP 3,942.1*    ProBNP (last 3 results) No results for input(s): PROBNP in the last 8760 hours.   D-Dimer No results for input(s): DDIMER in the last 72 hours. Hemoglobin A1C No results for input(s): HGBA1C in the last 72 hours. Fasting Lipid Panel No results for input(s): CHOL, HDL, LDLCALC, TRIG, CHOLHDL, LDLDIRECT in the last 72 hours. Thyroid Function Tests Recent Labs    10/17/21 1358  TSH 3.545  T3FREE 1.8*    Other results:   Imaging    No results found.   Medications:     Scheduled Medications:  amiodarone  200 mg Oral BID   apixaban  5 mg Oral BID   Chlorhexidine Gluconate Cloth  6 each Topical Daily   isosorbide-hydrALAZINE  2 tablet Oral TID   potassium chloride  40 mEq Oral Once   rosuvastatin  20 mg Oral Daily   sodium chloride flush  10-40 mL Intracatheter Q12H    Infusions:  sodium chloride     magnesium sulfate bolus IVPB     milrinone      PRN Medications: acetaminophen **OR** acetaminophen (TYLENOL) oral liquid 160 mg/5 mL **OR** acetaminophen, guaiFENesin-dextromethorphan, senna-docusate, sodium chloride flush    Patient Profile   56 y.o. male with hx chronic systolic CHF/NICM, HTN, CKD IIIb, hx hepatitis C infection, noncompliance with medical therapy. Admitted with CVA, PE and acute on chronic systolic CHF w/ low output. Has been [facility] at [facility].  Echo with biventricular hypertrophy/dysfunction EF  15-20% Moderate MR moderate RV dysfunction.  Assessment/Plan   Acute on chronic systolic CHF/NICM: -Diagnosed 2018. Normal coronaries on LHC in 2019. Felt to be due to uncontrolled hypertension. -EF previously recovered to 45-50%.  -EF down to 25-30% in 06/21 in setting of uncontrolled HTN.  -Echo 01/23: EF 15-20%, RV moderately reduced, severe biatrial enlargement, moderate MR, moderate TR, moderate AI -Volume overloaded w/ low output, initial co-ox 44%. Earlier this week DBA stopped and switched to milrinone 0.25 mcg.  - CO -OX 77%. Will cut milrinone to 0.125 mcg.  - Looks euvolemic with CVP 2, stop Lasix.  - No beta blocker with a/c CHF -Now off losartan with AKI on CKD. SCr now stable. Will add Entresto 24/26 bid.  - Spironolactone tomorrow if creatinine stable.  -Eventual SGLT2i. A1c 5.2% - Continue bidil 2 tabs thee times a day.   Will check on insurance coverage. CO-Pay 47.00 may need to switch to hydralazine/imdur at d/c -Currently not candidate for advanced therapies currently d/t noncompliance and renal impairment.   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 Scattered infarcts on MRI brain -No large vessel occlusion on CTA head and neck -Negative bubble study on echo - MRI- Scattered infarcts involving the right cerebellar hemisphere, left cerebral peduncle, left hippocampus, left thalamus, right occipital lobe, and left pre and postcentral gyri, some of which appear acute while others appear more subacute in chronicity. - On statin.   - Neuro signed off.  PT/OT following.   3. PE: -Incidentally noted on CTA chest -On apixaban.   -LE dopplers negative for DVT -Hypercoagulable labs pending   4. AKI on CKD IIIb: -Baseline Scr 1.7 -SCr peaked to 2.22 -->today 2.05-->2.03 -->2.15 -> 2.0 -> 1.95 -Renal US c/w medical renal disease. -suspect cardiorenal from low output HF  5. PVCs /NSVT Placed on amio drip 2/2 due to increased PVC burden. Few PVCs, but 1 short NSVT run noted overnight.   - Continue amiodarone 200 bid,  - Wean off milrinone.   6. HTN - Uncontrolled. - Improved on Bidil but BP still up  - Adding Entresto.  - Spironolactone tomorrow if creatinine stable.   PT recommending CIR.    Length of Stay: [place], MD  10/20/2021, 9:33 AM  Advanced Heart Failure Team Pager [phone] (M-F; 7a - 5p)  Please contact [facility] for night-coverage after hours (5p -7a ) and weekends on [email or web address]

## 2021-10-20 NOTE — Progress Notes (Signed)
Subjective: No acute overnight events.   Patient is seen at bedside during rounds this morning. Pt is awake and interactive. He is responsive to questions. He reports improvement in strength.      Stable mental status with no new acute focal deficits.   Objective:  Vital signs in last 24 hours: Vitals:   10/20/21 0001 10/20/21 0405 10/20/21 0825 10/20/21 1126  BP: (!) 132/99 (!) 142/89 (!) 140/100 95/63  Pulse: (!) 59 85 82 96  Resp: 20 20 19 15   Temp: 98.2 F (36.8 C) 97.7 F (36.5 C) (!) 97.5 F (36.4 C) 97.8 F (36.6 C)  TempSrc: Oral Oral Oral Oral  SpO2: 99% 97% 100% 99%  Weight:  58.4 kg    Height:       Constitutional: sleepy but wakes up to voice  HENT: normocephalic, atraumatic, mucous membranes moist Cardiovascular: RRR, no m/r/g, trace bilateral LE edema  Pulmonary/Chest: diminished breath sounds bilaterally.  Extremities: No twitching of the extremities noted   Neurological: A&O to person placed and situation, and possibly time.   Bilateral ptosis is present.  5/5 strength of right upper and lower extremity 5/5 strength of left upper and lower extremities Good finger grip bilaterally  Assessment/Plan:  Principal Problem:   Acute CVA (cerebrovascular accident) ([place]) Active Problems:   Hypertension, uncontrolled   Non-ischemic cardiomyopathy ([place])   Stage 3 chronic kidney disease ([place])   Acute pulmonary embolism ([place])   Acute on chronic congestive heart failure (HCC)  Lavoris Ricketson is a 56 y.o. male with a pertinent PMH of nonischemic cardiomyopathy, HFrEF (EF 25-30% 02/2020), HLD, HTN, and CKD3b admitted for acute ischemic stroke 2/2 embolic infarctions, mild nonocclusive pulmonary emboli, and acute on chronic HFrEF exacerbation.   Multifocal ischemic brain infarcts Suspecting embolic stroke Twitching and lethargy, improved  Marked improvement in multifactorial encephalopathy related to respiratory and cardiac failure. TEE once more stable, and  cardiac monitor at DC Remains hypertensive, but improving with recent addition of Bidil and Entresto    - Continue Bidil tid  - Continue Entresto 24/26 bid  - Started spiro 12.5 today   - Weaned off of Milrinone, DC'd today   - Continue crestor 20 mg - Telemetry  - Frequent neuro checks  - PT/OT recommending CIR; CIR denied d/t lack of support after DC, recommending SNF - TOC consulted for SNF placement  - SLP following    Mild nonocclusive pulmonary emboli, source unclear  Hypercoagulably workup largely unremarkable till now, including factor 5 Leiden; prothrombin gene mutation pending. TTE LVEF 15-20% with severely decreased left ventricle function.  - TEE pending clinical stability  - Started on Eliquis 5mg  bid 2/4 per cardiology - Follow-up on prothrombin gene mutation    AoC HFrEF exacerbation Nonischemic hypertensive cardiomyopathy Acute pulmonary edema in the s/o hypertensive crisis Uncontrolled HTN Euvolemic on exam , with weight improved from 77.1 kg (admission, 1/27) to 56.4 kg (today) and net out 5.9L with IV Lasix (stopped 2/5). Optimizing medications to better control BP.  - Advanced HF team following, appreciate assistance; feel that pt is ready for CIR/SNF soon  - Continue Bidil  - Continue Entresto 24/26 bid  - Started spiro 12.5 today   - Amiodarone for PVCs - Daily weights and I/O's - Cardiac monitoring  - Stopped further Lasix  - Stopped Milrinone - Holding BB in s/o acute on chronic HF   AKI on CKD3b Likely cardiorenal from low output HF. Baseline Cr 1.6-1.8. Cr been stable near 2 this admission,  with improvement to 1.9 today; may be new baseline given no significant change with aggressive diuresis and improvement in CVP/volume status.  - Hold further diuresis  - Trend BMP - Avoid nephrotoxins    Stable ascending thoracic aortic dilatation  4.2 cm in size. - Annual imaging follow-up recommended by CTA or MRA    Best Practice: Diet: Dysphagia 3  IVF:  None, None VTE: Eliquis Code: Full  Oscar Manes, MD  Internal Medicine Resident, PGY-1 Pager: [phone] After 5pm on weekdays and 1pm on weekends: On Call pager [phone]

## 2021-10-20 NOTE — Progress Notes (Signed)
Patient awake and alert for most of the day. He had several visitors and ate 100% of dinner. Patient fell asleep. When trying to wake for modified stroke scale; patient refused and said not right now.

## 2021-10-21 DIAGNOSIS — I639 Cerebral infarction, unspecified: Secondary | ICD-10-CM | POA: Diagnosis not present

## 2021-10-21 DIAGNOSIS — I5023 Acute on chronic systolic (congestive) heart failure: Secondary | ICD-10-CM | POA: Diagnosis not present

## 2021-10-21 DIAGNOSIS — I428 Other cardiomyopathies: Secondary | ICD-10-CM | POA: Diagnosis not present

## 2021-10-21 DIAGNOSIS — I2699 Other pulmonary embolism without acute cor pulmonale: Secondary | ICD-10-CM | POA: Diagnosis not present

## 2021-10-21 DIAGNOSIS — E440 Moderate protein-calorie malnutrition: Secondary | ICD-10-CM | POA: Insufficient documentation

## 2021-10-21 DIAGNOSIS — I130 Hypertensive heart and chronic kidney disease with heart failure and stage 1 through stage 4 chronic kidney disease, or unspecified chronic kidney disease: Secondary | ICD-10-CM | POA: Diagnosis not present

## 2021-10-21 LAB — CBC
HCT: 38.5 % — ABNORMAL LOW (ref 39.0–52.0)
Hemoglobin: 13.2 g/dL (ref 13.0–17.0)
MCH: 32.1 pg (ref 26.0–34.0)
MCHC: 34.3 g/dL (ref 30.0–36.0)
MCV: 93.7 fL (ref 80.0–100.0)
Platelets: 166 10*3/uL (ref 150–400)
RBC: 4.11 MIL/uL — ABNORMAL LOW (ref 4.22–5.81)
RDW: 13.6 % (ref 11.5–15.5)
WBC: 7.1 10*3/uL (ref 4.0–10.5)
nRBC: 0 % (ref 0.0–0.2)

## 2021-10-21 LAB — BASIC METABOLIC PANEL
Anion gap: 9 (ref 5–15)
BUN: 29 mg/dL — ABNORMAL HIGH (ref 6–20)
CO2: 30 mmol/L (ref 22–32)
Calcium: 8.4 mg/dL — ABNORMAL LOW (ref 8.9–10.3)
Chloride: 94 mmol/L — ABNORMAL LOW (ref 98–111)
Creatinine, Ser: 1.89 mg/dL — ABNORMAL HIGH (ref 0.61–1.24)
GFR, Estimated: 41 mL/min — ABNORMAL LOW (ref >60)
Glucose, Bld: 106 mg/dL — ABNORMAL HIGH (ref 70–99)
Potassium: 4.2 mmol/L (ref 3.5–5.1)
Sodium: 133 mmol/L — ABNORMAL LOW (ref 135–145)

## 2021-10-21 LAB — COOXEMETRY PANEL
Carboxyhemoglobin: 1 % (ref 0.5–1.5)
Methemoglobin: 1 % (ref 0.0–1.5)
O2 Saturation: 76.7 %
Total hemoglobin: 13.2 g/dL (ref 12.0–16.0)

## 2021-10-21 LAB — MAGNESIUM: Magnesium: 2.2 mg/dL (ref 1.7–2.4)

## 2021-10-21 LAB — PROTHROMBIN GENE MUTATION

## 2021-10-21 MED ORDER — SACUBITRIL-VALSARTAN 24-26 MG PO TABS
1.0000 | ORAL_TABLET | Freq: Two times a day (BID) | ORAL | Status: DC
  Administered 2021-10-21 – 2021-10-25 (×9): 1 via ORAL
  Filled 2021-10-21 (×9): qty 1

## 2021-10-21 MED ORDER — SPIRONOLACTONE 12.5 MG HALF TABLET
12.5000 mg | ORAL_TABLET | Freq: Every day | ORAL | Status: DC
  Administered 2021-10-21 – 2021-10-25 (×5): 12.5 mg via ORAL
  Filled 2021-10-21 (×5): qty 1

## 2021-10-21 MED ORDER — ENSURE ENLIVE PO LIQD
237.0000 mL | Freq: Two times a day (BID) | ORAL | Status: DC
  Administered 2021-10-21 – 2021-10-25 (×4): 237 mL via ORAL

## 2021-10-21 MED ORDER — ADULT MULTIVITAMIN W/MINERALS CH
1.0000 | ORAL_TABLET | Freq: Every day | ORAL | Status: DC
  Administered 2021-10-21 – 2021-10-25 (×5): 1 via ORAL
  Filled 2021-10-21 (×5): qty 1

## 2021-10-21 MED ORDER — SODIUM CHLORIDE 0.45 % IV SOLN
INTRAVENOUS | Status: DC
  Administered 2021-10-21: 10 mL/h via INTRAVENOUS

## 2021-10-21 MED ORDER — POTASSIUM CHLORIDE CRYS ER 20 MEQ PO TBCR: 40.0000 meq | EXTENDED_RELEASE_TABLET | Freq: Once | ORAL | Status: DC

## 2021-10-21 NOTE — Progress Notes (Signed)
Initial Nutrition Assessment  DOCUMENTATION CODES:  Non-severe (moderate) malnutrition in context of acute illness/injury, Underweight  INTERVENTION:  Liberalize diet to regular. Nursing staff to assist with feeding Ensure Enlive po BID, each supplement provides 350 kcal and 20 grams of protein. MVI with minerals daily Snacks TID  NUTRITION DIAGNOSIS:  Moderate Malnutrition related to chronic illness (CHF) as evidenced by moderate muscle depletion, mild fat depletion.  GOAL:  Patient will meet greater than or equal to 90% of their needs  MONITOR:  PO intake, Supplement acceptance, Weight trends  REASON FOR ASSESSMENT:  Consult Assessment of nutrition requirement/status  ASSESSMENT:  56 y.o. male with history of CHF (25-30%), HTN, HLD, CAD  and CKD3 presented to the ED with LOC and AMS after being found down in the bathroom by his coworkers. Imaging in ED showed multiple scattered infarcts.  Pt resting in bed at the time of assessment, quite sleepy and did not engage in much conversation. NT in room preparing to give pt a bath. States that pt is eating well during his meals, but that he is having a hard time feeding himself foods that require utensils. She has assisted him with eating while he has been her patient. Seems to do better with finger foods.   Severe weight loss of 25% in the last week is noted in chart. Highly unlikely, even given the amount of fluid that has been removed. Wt shows a ~10 lb weight loss in the last two days alone. Bed weight obtained while in the room was 60.0 kg. Will continue to monitor to determine accuracy.  Muscle and fat deficits are present on exam.  Average Meal Intake: 2/4: 38% intake x 4 recorded meals  Nutritionally Relevant Medications: Scheduled Meds:  rosuvastatin  20 mg Oral Daily   PRN Meds: senna-docusate  Labs Reviewed: Sodium 133 / chloride 94 BUN 29 / creatinine 1.89   NUTRITION - FOCUSED PHYSICAL EXAM: Flowsheet Row Most  Recent Value  Orbital Region Mild depletion  Upper Arm Region Severe depletion  Thoracic and Lumbar Region Moderate depletion  Buccal Region Mild depletion  Temple Region Moderate depletion  Clavicle Bone Region Mild depletion  Clavicle and Acromion Bone Region Moderate depletion  Scapular Bone Region Moderate depletion  Dorsal Hand No depletion  Patellar Region Moderate depletion  Anterior Thigh Region Moderate depletion  Posterior Calf Region Moderate depletion  Edema (RD Assessment) None  Hair Reviewed  Eyes Reviewed  Mouth Reviewed  Skin Reviewed  Nails Reviewed   Diet Order:   Diet Order             Diet Heart Room service appropriate? Yes; Fluid consistency: Thin  Diet effective now                   EDUCATION NEEDS:  Not appropriate for education at this time  Skin:  Skin Assessment: Reviewed RN Assessment  Last BM:  2/1  Height:  Ht Readings from Last 1 Encounters:  10/11/21 5\' 11"  (1.803 m)   Weight:  Wt Readings from Last 1 Encounters:  10/21/21 56.4 kg    Ideal Body Weight:  78.2 kg  BMI:  Body mass index is 17.34 kg/m.  Estimated Nutritional Needs:  Kcal:  1900-2100 kcal Protein:  95-105 g/d Fluid:  1.8-2 L/d  12/19/21, RD, LDN Clinical Dietitian RD pager # available in [facility]  After hours/weekend pager # available in [facility]

## 2021-10-21 NOTE — Progress Notes (Addendum)
Occupational Therapy Treatment Patient Details Name: Oscar Rosales MRN: [id number] DOB: 1966-05-09 Today's Date: 10/21/2021   History of present illness Pt is a 56 y/o male admitted secondary to fatigue and syncopal episode at work. 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 Pt also found to have mild nonocclusive pulmonary emboli and started on Heparin drip. PMH including but not limited to HFrEF (EF 25-30% 02/2020), HLD, HTN, CAD, and CKD3b.   OT comments  Oscar Rosales is making progress towards his acute OT goals. Pt reports general fatigue after PT and mobility sessions therefore session focused on grooming and vision strategies. Overall, pt was set up for grooming at the bed side with increased time and minimal cueing. He was min-mod A for standing and stepping at the bed side with multimodial cues. Pt required mod cues for appropriate scanning patterns to locate items within the room. D/c updated to SNF as AIR has signed off on pt due to not having 24/7 at d/c. OT to continue to follow acutely.  Addendum: [facility] now considering pt for admission, continue to recommend AIR at d/c for intensive therapies to progress towards pt's baseline prior to d/c home.    Recommendations for follow up therapy are one component of a multi-disciplinary discharge planning process, led by the attending physician.  Recommendations may be updated based on patient status, additional functional criteria and insurance authorization.    Follow Up Recommendations  Acute inpatient rehab (3hours/day)      Patient can return home with the following  A lot of help with walking and/or transfers;A lot of help with bathing/dressing/bathroom;Help with stairs or ramp for entrance;Assist for  transportation;Assistance with feeding;Direct supervision/assist for medications management   Equipment Recommendations  Other (comment) (defer to SNF)    Recommendations for Other Services Rehab consult    Precautions / Restrictions Precautions Precautions: Fall;Other (comment) Precaution Comments: watch HR, BP Restrictions Weight Bearing Restrictions: No       Mobility Bed Mobility Overal bed mobility: Needs Assistance Bed Mobility: Supine to Sit, Sit to Supine     Supine to sit: Min guard Sit to supine: Min guard   General bed mobility comments: No physical assist needed, increased time.    Transfers Overall transfer level: Needs assistance Equipment used: Rolling walker (2 wheels) Transfers: Sit to/from Stand Sit to Stand: Min assist           General transfer comment: min A to steady and to take side steps at the bed side     Balance Overall balance assessment: Needs assistance Sitting-balance support: Feet supported, No upper extremity supported Sitting balance-Leahy Scale: Fair Sitting balance - Comments: sitting well, mild LOB with dynamic reaching. able to self correct   Standing balance support: During functional activity, Reliant on assistive device for balance, Bilateral upper extremity supported Standing balance-Leahy Scale: Poor Standing balance comment: requires external support in standing                           ADL either performed or assessed with clinical judgement   ADL Overall ADL's : Needs assistance/impaired     Grooming: Set up;Sitting Grooming Details (indicate cue type and reason): with minimal cues  Functional mobility during ADLs: Moderate assistance;Cueing for sequencing;Cueing for safety;Rolling walker (2 wheels) General ADL Comments: Pt just finishing with PT and mobility specialist upon my arrival - focused on grooming and visual strategies due to fatigue     Extremity/Trunk Assessment Upper Extremity Assessment RUE Deficits / Details: limited over head ROM, uses functionally for grooming and oral hygiene. slow and deliberate FM coordination RUE Sensation: decreased light touch RUE Coordination: decreased fine motor;decreased gross motor   Lower Extremity Assessment Lower Extremity Assessment: Defer to PT evaluation        Vision   Vision Assessment?: Vision impaired- to be further tested in functional context Eye Alignment: Within Functional Limits Ocular Range of Motion: Impaired-to be further tested in functional context Tracking/Visual Pursuits: Impaired - to be further tested in functional context Saccades: Undershoots;Additional eye shifts occurred during testing;Additional head turns occurred during testing Visual Fields: Impaired-to be further tested in functional context Additional Comments: Pt tracking therapist with increased cues, requires constant cues for safety due to low vision and poor insight. Pt able to read date on wall from his bed, and the clock wtih increased time.   Perception Perception Perception: Not tested   Praxis Praxis Praxis: Not tested    Cognition Arousal/Alertness: Awake/alert Behavior During Therapy: Flat affect Overall Cognitive Status: Impaired/Different from baseline Area of Impairment: Orientation, Attention, Memory, Following commands, Safety/judgement, Awareness, Problem solving                 Orientation Level: Disoriented to, Time, Situation Current Attention Level: Selective Memory: Decreased short-term memory, Decreased recall of precautions Following Commands: Follows one step commands consistently, Follows one step commands with increased time, Follows multi-step commands inconsistently Safety/Judgement: Decreased awareness of safety, Decreased awareness of deficits Awareness: Emergent Problem Solving: Slow processing, Requires verbal cues General Comments: disoriented but  following all commands with incrased time. admits to visual dificts/changes.        Exercises      Shoulder Instructions       General Comments VSS on RA    Pertinent Vitals/ Pain       Pain Assessment Pain Assessment: No/denies pain   Frequency  Min 2X/week        Progress Toward Goals  OT Goals(current goals can now be found in the care plan section)  Progress towards OT goals: Progressing toward goals  Acute Rehab OT Goals Patient Stated Goal: brush teeth OT Goal Formulation: With patient Time For Goal Achievement: 11/01/21 Potential to Achieve Goals: Good ADL Goals Pt Will Perform Grooming: with modified independence;sitting Pt Will Perform Upper Body Dressing: with modified independence;sitting Pt Will Perform Lower Body Dressing: with modified independence;sit to/from stand Pt Will Transfer to Toilet: with modified independence;ambulating  Plan Discharge plan needs to be updated       AM-PAC OT "6 Clicks" Daily Activity     Outcome Measure   Help from another person eating meals?: A Little Help from another person taking care of personal grooming?: A Little Help from another person toileting, which includes using toliet, bedpan, or urinal?: A Lot Help from another person bathing (including washing, rinsing, drying)?: A Lot Help from another person to put on and taking off regular upper body clothing?: A Little Help from another person to put on and taking off regular lower body clothing?: A Lot 6 Click Score: 15    End of Session Equipment Utilized During Treatment: Gait belt;Rolling walker (2 wheels)  OT Visit Diagnosis: Unsteadiness on feet (R26.81);Other abnormalities of gait  and mobility (R26.89);Muscle weakness (generalized) (M62.81);Hemiplegia and hemiparesis;Low vision, both eyes (H54.2) Hemiplegia - Right/Left: Right Hemiplegia - dominant/non-dominant: Dominant Hemiplegia - caused by: Cerebral infarction   Activity Tolerance Patient  tolerated treatment well   Patient Left in bed;with call bell/phone within reach;with bed alarm set   Nurse Communication Mobility status        Time: 6073-7106 OT Time Calculation (min): 19 min  Charges: OT General Charges $OT Visit: 1 Visit OT Treatments $Self Care/Home Management : 8-22 mins   Kimba Lottes A Jakhari Space 10/21/2021, 4:39 PM

## 2021-10-21 NOTE — Progress Notes (Signed)
Subjective: No acute overnight events.   Patient is seen at bedside during rounds this morning. Pt is awake and interactive. He is responsive to questions. He reports improvement in strength.      Stable mental status with no new acute focal deficits.   Objective:  Vital signs in last 24 hours: Vitals:   10/21/21 0000 10/21/21 0448 10/21/21 0800 10/21/21 1102  BP: 115/89 (!) 132/98 (!) 155/105 (!) 137/96  Pulse: 85 87 88 92  Resp: (!) 24 20 16 16   Temp:  (!) 97.4 F (36.3 C) 97.9 F (36.6 C) 97.9 F (36.6 C)  TempSrc:  Oral Oral Oral  SpO2: 100% 100% 99% 95%  Weight:  56.4 kg    Height:       Constitutional: sleepy but wakes up to voice  HENT: normocephalic, atraumatic, mucous membranes moist Cardiovascular: RRR, no m/r/g, trace bilateral LE edema  Pulmonary/Chest: diminished breath sounds bilaterally.  Extremities: No twitching of the extremities noted   Neurological: A&O to person placed and situation, and possibly time.   Bilateral ptosis is present.  5/5 strength of right upper and lower extremity 5/5 strength of left upper and lower extremities Good finger grip bilaterally  Assessment/Plan:  Principal Problem:   Acute CVA (cerebrovascular accident) ([place]) Active Problems:   Hypertension, uncontrolled   Non-ischemic cardiomyopathy ([place])   Stage 3 chronic kidney disease ([place])   Acute pulmonary embolism ([place])   Acute on chronic congestive heart failure (H[facility])  Oscar Rosales is a 56 y.o. male with a pertinent PMH of nonischemic cardiomyopathy, HFrEF (EF 25-30% 02/2020), HLD, HTN, and CKD3b admitted for acute ischemic stroke 2/2 embolic infarctions, mild nonocclusive pulmonary emboli, and acute on chronic HFrEF exacerbation.   Multifocal ischemic brain infarcts Suspecting embolic stroke Twitching and lethargy, improved  Marked improvement in multifactorial encephalopathy related to respiratory and cardiac failure. TEE Thursday, and cardiac monitor at DC. Remains  hypertensive, but improving with recent addition of Bidil, Entresto, and Arlyce Harman  - TEE on 2/9 at 1130 am  - Continue Bidil tid  - Continue Entresto 24/26 bid  - Continue Spiro 12.5  - Continue crestor 20 mg - Telemetry  - Frequent neuro checks  - PT/OT/SLP following - TOC consulted for SNF placement (? Possibly candidate for [facility] per TOC)   Mild nonocclusive pulmonary emboli, source unclear  Hypercoagulably workup largely unremarkable till now, including factor 5 Leiden; prothrombin gene mutation pending. TTE LVEF 15-20% with severely decreased left ventricle function.  - TEE Thursday  - Started on Eliquis 5mg  bid 2/4 per cardiology - Follow-up on prothrombin gene mutation    AoC HFrEF exacerbation Nonischemic hypertensive cardiomyopathy Acute pulmonary edema in the s/o hypertensive crisis Uncontrolled HTN Euvolemic on exam. Diuresed 6.3L with IV Lasix. Optimizing medications to better control BP.  - Advanced HF team following, appreciate assistance; feel that pt is ready for CIR/SNF soon  - Continue Bidil  - Continue Entresto 24/26 bid  - Continue spiro 12.5     - Started Jardiance 10 today     - Amiodarone for PVCs - Daily weights and I/O's - Cardiac monitoring  - Holding BB in s/o acute on chronic HF   AKI on CKD3b Likely cardiorenal from low output HF. Baseline Cr 1.6-1.8. Cr been stable near 2 this admission, with improvement to 1.8 today s/p d/c Lasix; may be approaching new baseline. - Trend BMP - Avoid nephrotoxins    Stable ascending thoracic aortic dilatation  4.2 cm in size. - Annual  imaging follow-up recommended by CTA or MRA    Best Practice: Diet: Dysphagia 3  IVF: None, None VTE: Eliquis Code: Full  [name], MD  Internal Medicine Resident, PGY-1 Pager: [phone] After 5pm on weekdays and 1pm on weekends: On Call pager [phone]

## 2021-10-21 NOTE — Progress Notes (Signed)
Mobility Specialist Criteria Algorithm Info.   10/21/21 1500  Mobility  Activity Ambulated with assistance in hallway;Transferred from chair to bed (in chair before to bed after)  Range of Motion/Exercises Active;All extremities  Level of Assistance Minimal assist, patient does 75% or more (LOB x 2)  Assistive Device Front wheel walker  Distance Ambulated (ft) 90 ft  Activity Response Tolerated well;Tolerated fair   Patient received in chair agreeable to participate. Was min A to stand + cues for hand placement. Ambulated in hallway with unsteady gait requiring mod-max cues to avoid furniture and miscellaneous items. Returned to room requesting to get back in bed. Was left in bed with all needs met. OT present at end of session.   10/21/2021 3:58 PM  Oscar Rosales, [place], [place]  MLJQG:[phone] Office: [phone]

## 2021-10-21 NOTE — Progress Notes (Signed)
Inpatient Rehab Admissions Coordinator:    I spoke with Pt.'s wife regarding CIR vs. SNF. SLP saw Pt. This morning and documented continued cognitive deficits that would likely preclude Pt. From being able to be left alone safely following d/c from CIR. Per  pt.'s wife, she will have to work and no other family will be able to stay home with him, so they would like to pursue SNF instead. CIR will sign off at this time. I will notify TOC.   Megan Salon, MS, CCC-SLP Rehab Admissions Coordinator  [phone] (celll) [phone] (office)

## 2021-10-21 NOTE — TOC Progression Note (Addendum)
Transition of Care [facility]) - Progression Note    Patient Details  Name: Oscar Rosales MRN: [id number] Date of Birth: [birth year]  Transition of [facility]) CM/SW Contact  Carley Hammed, [place] Phone Number: 10/21/2021, 3:08 PM  Clinical Narrative:    4;45 CSW received a call from [place] with [facility] CIR that they are still following for placement. CSW will defer to HF CSW. TOC will continue to follow. CSW was notified that family is interested in pursuing SNF and CIR has signed off. CSW touched base with pt and spouse. All questions were answered and bed offers provided. Spouse stated she will follow up with bed choice. TOC will continue to follow for DC needs.  Expected Discharge Plan: Skilled Nursing Facility Barriers to Discharge: Continued Medical Work up, SNF Pending bed offer, English as a second language teacher  Expected Discharge Plan and Services Expected Discharge Plan: Skilled Nursing Facility In-house Referral: Clinical Social Work Discharge Planning Services: CM Consult Post Acute Care Choice: Skilled Nursing Facility Living arrangements for the past 2 months: Single Family Home                                       Social Determinants of Health (SDOH) Interventions Food Insecurity Interventions: Intervention Not Indicated Housing Interventions: Intervention Not Indicated  Readmission Risk Interventions No flowsheet data found.

## 2021-10-21 NOTE — Progress Notes (Addendum)
Patient ID: Oscar Rosales, male   DOB: 1966/08/12, 56 y.o.   MRN: [id number]     Advanced Heart Failure Rounding Note  PCP-Cardiologist: None   Subjective:    1/30: DBA started for low output, initial Co-ox 44%  2/1 DBA stopped and switched to milrinone due to hypertension 2/5: Milrinone decreased to 0.125  Co-ox 77% on 0.125 milrinone. CVP 7.  Scr 2.15>2>1.95>1.89  K 4.2. Mag 2.2  Hypertensive this am.   More alert. Eating breakfast in bed and answering questions. Denies dyspnea at rest.   Objective:   Weight Range: 56.4 kg Body mass index is 17.34 kg/m.   Vital Signs:   Temp:  [97.4 F (36.3 C)-97.8 F (36.6 C)] 97.4 F (36.3 C) (02/06 0448) Pulse Rate:  [76-96] 87 (02/06 0448) Resp:  [15-24] 20 (02/06 0448) BP: (95-140)/(63-100) 132/98 (02/06 0448) SpO2:  [97 %-100 %] 100 % (02/06 0448) Weight:  [56.4 kg] 56.4 kg (02/06 0448) Last BM Date: 10/16/21  Weight change: Filed Weights   10/19/21 0316 10/20/21 0405 10/21/21 0448  Weight: 60.6 kg 58.4 kg 56.4 kg    Intake/Output:   Intake/Output Summary (Last 24 hours) at 10/21/2021 0812 Last data filed at 10/21/2021 0300 Gross per 24 hour  Intake 59.97 ml  Output 1000 ml  Net -940.03 ml      Physical Exam   General:  Chronically ill appearing. Sitting up in bed. HEENT: normal Neck: supple. JVP 7-8 cm Carotids 2+ bilat; no bruits.  Cor: PMI nondisplaced. Regular rate & rhythm. No rubs, gallops or murmurs. Lungs: clear Abdomen: soft, nontender, nondistended. No hepatosplenomegaly.  Extremities: no cyanosis, clubbing, rash, edema, + RUE PICC Neuro: More alert today. Following commands and answering questions.    Telemetry   Sinus 80s-90s (personally reviewed)  Labs    CBC Recent Labs    10/20/21 0500 10/21/21 0555  WBC 7.6 7.1  HGB 13.4 13.2  HCT 39.5 38.5*  MCV 93.8 93.7  PLT 170 166   Basic Metabolic Panel Recent Labs    63/84/53 0500 10/21/21 0555  NA 135 133*  K 3.3* 4.2  CL 91* 94*   CO2 32 30  GLUCOSE 122* 106*  BUN 33* 29*  CREATININE 1.95* 1.89*  CALCIUM 8.6* 8.4*  MG 1.8 2.2   Liver Function Tests No results for input(s): AST, ALT, ALKPHOS, BILITOT, PROT, ALBUMIN in the last 72 hours. No results for input(s): LIPASE, AMYLASE in the last 72 hours. Cardiac Enzymes No results for input(s): CKTOTAL, CKMB, CKMBINDEX, TROPONINI in the last 72 hours.  BNP: BNP (last 3 results) Recent Labs    10/11/21 1245  BNP 3,942.1*    ProBNP (last 3 results) No results for input(s): PROBNP in the last 8760 hours.   D-Dimer No results for input(s): DDIMER in the last 72 hours. Hemoglobin A1C No results for input(s): HGBA1C in the last 72 hours. Fasting Lipid Panel No results for input(s): CHOL, HDL, LDLCALC, TRIG, CHOLHDL, LDLDIRECT in the last 72 hours. Thyroid Function Tests No results for input(s): TSH, T4TOTAL, T3FREE, THYROIDAB in the last 72 hours.  Invalid input(s): FREET3   Other results:   Imaging    No results found.   Medications:     Scheduled Medications:  amiodarone  200 mg Oral BID   apixaban  5 mg Oral BID   Chlorhexidine Gluconate Cloth  6 each Topical Daily   isosorbide-hydrALAZINE  2 tablet Oral TID   rosuvastatin  20 mg Oral Daily   sodium chloride  flush  10-40 mL Intracatheter Q12H    Infusions:  sodium chloride     milrinone 0.125 mcg/kg/min (10/20/21 1004)    PRN Medications: acetaminophen **OR** acetaminophen (TYLENOL) oral liquid 160 mg/5 mL **OR** acetaminophen, guaiFENesin-dextromethorphan, senna-docusate, sodium chloride flush    Patient Profile   56 y.o. male with hx chronic systolic CHF/NICM, HTN, CKD IIIb, hx hepatitis C infection, noncompliance with medical therapy. Admitted with CVA, PE and acute on chronic systolic CHF w/ low output. Has been followed at [facility].  Echo with biventricular hypertrophy/dysfunction EF 15-20% Moderate MR moderate RV dysfunction.  Assessment/Plan   Acute on chronic systolic  CHF/NICM: -Diagnosed 2018. Normal coronaries on LHC in 2019. Felt to be due to uncontrolled hypertension. -EF previously recovered to 45-50%.  -EF down to 25-30% in 06/21 in setting of uncontrolled HTN.  -Echo 01/23: EF 15-20%, RV moderately reduced, severe biatrial enlargement, moderate MR, moderate TR, moderate AI -Volume overloaded w/ low output, initial co-ox 44%. DBA later stopped and switched to milrinone 0.25 mcg.  - CO -OX 77% on milrinone 0.125. Stop milrinone today and monitor co-ox - CVP 7. Off lasix.  - No beta blocker with a/c CHF - Now off losartan with AKI on CKD. SCr now stable. Start Entresto 24/26 mg BID - Add spiro 12.5 mg daily tomorrow if Scr stable - Continue bidil 2 tabs three times a day. CO-Pay 47.00 may need to switch to hydralazine/imdur at d/c -Currently not candidate for advanced therapies currently d/t noncompliance and renal impairment.   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 Scattered infarcts on MRI brain -No large vessel occlusion on CTA head and neck -Negative bubble study on echo - MRI- Scattered infarcts involving the right cerebellar hemisphere, left cerebral peduncle, left hippocampus, left thalamus, right occipital lobe, and left pre and postcentral gyri, some of which appear acute while others appear more subacute in chronicity. - On statin.   - Neuro signed off.  PT/OT following.   3. PE: -Incidentally noted on CTA chest -On apixaban.   -LE dopplers negative for DVT -Hypercoagulable labs pending   4. AKI on CKD IIIb: -Baseline Scr 1.7 -SCr peaked to 2.22 -->today 2.05-->2.03 -->2.15 -> 2.0 -> 1.95 -> 1.89 -Renal US c/w medical renal disease. -suspect cardiorenal from low output HF  5. PVCs /NSVT - Placed on amio drip 2/2 due to increased PVC burden.  - Few PVCs overnight, no recurrent NSVT - Continue amiodarone 200 bid,  - Wean off milrinone as above  6. HTN - Uncontrolled. - Improved on Bidil but BP still up  - Start Entresto  PT  recommending CIR.    Length of Stay: 9  Oscar Rosales, Oscar Rosales, Oscar Rosales  10/21/2021, 8:12 AM  Advanced Heart Failure Team Pager [phone] (M-F; 7a - 5p)  Please contact [facility] for night-coverage after hours (5p -7a ) and weekends on [email or web address]  Patient seen and examined with the above-signed Advanced Practice Provider and/or Housestaff. I personally reviewed laboratory data, imaging studies and relevant notes. I independently examined the patient and formulated the important aspects of the plan. I have edited the note to reflect any of my changes or salient points. I have personally discussed the plan with the patient and/or family.  More alert today. Has diuresed well with IV lasix. Denies dyspnea, orthopnea or PND. BP improved.   General:  Sitting up in bed. Eating breakfast  No resp difficulty HEENT: normal  R ptosis Neck: supple. no JVD. Carotids 2+ bilat; no bruits. No lymphadenopathy or thryomegaly appreciated.  Cor: PMI nondisplaced. Regular rate & rhythm. No rubs, gallops or murmurs. Lungs: clear Abdomen: soft, nontender, nondistended. No hepatosplenomegaly. No bruits or masses. Good bowel sounds. Extremities: no cyanosis, clubbing, rash, edema Neuro: alert & orientedx3, weak on R   He is looking better. Now euvolemic. Can stop milrinone. Continue to titrate GDMT. Should be ready for rehab soon.   Glori Bickers, MD  8:44 AM

## 2021-10-21 NOTE — Progress Notes (Signed)
Per patient, no one has talked with him about a procedure tomorrow, he is not able to sign consent, I tried to page Ria Comment, [place] to clarify orders placed, but she did not return page and I could not find her in [place]

## 2021-10-21 NOTE — Progress Notes (Signed)
Speech Language Pathology Treatment: Cognitive-Linquistic  Patient Details Name: Oscar Rosales MRN: [id number] DOB: 1966/02/04 Today's Date: 10/21/2021 Time: [id number] SLP Time Calculation (min) (ACUTE ONLY): 15 min  Assessment / Plan / Recommendation Clinical Impression  Pt seen for cognition. He was in bed, participatory and friendly with ongoing drowsiness.  Today he was oriented to person, required cues to find calendar, after which he was able to read aloud date/DOW/year.  After five min delay when posed with the same questions, he required cues again to search for information in room.  He had difficulty attending to question and retaining information in working memory long enough to answer. Awareness remains inconsistent - initially, he stated that the stroke impacted his arms and legs and he needed assistance to stand.  Later in the session, he stated he could get to chair by himself and would get better with time without therapy.  Overall, he demonstrates improved sustained attention and some carryover of recall when environment is quiet and when directions are specific and deliberate.  At this point, his awareness and insight are still quite impaired. SLP will continue to follow while admitted.   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 Pt had also had vision changes starting the day before admission. MRI revealed multifocal ischemic infarcts involving the right cerebellar hemisphere, left cerebral peduncle, left hippocampus, left thalamus, right occipital lobe, and left pre and postcentral gyri. PMH includes: HFrEF (EF 25-30% 02/2020), HLD, HTN, CAD, and CKD3b      SLP Plan  Continue with current plan of care      Recommendations for follow up therapy are one component of a multi-disciplinary discharge planning process, led by the attending physician.  Recommendations may be updated based on patient status, additional functional  criteria and insurance authorization.    Recommendations                 Oral Care Recommendations: Oral care BID Follow Up Recommendations: Acute inpatient rehab (3hours/day) Assistance recommended at discharge: Frequent or constant Supervision/Assistance SLP Visit Diagnosis: Cognitive communication deficit [id number]) Plan: Continue with current plan of care         Ardena Gangl L. Tivis Ringer, [place] CCC/SLP Acute Rehabilitation Services Office number [phone] Pager [phone]   Oscar Rosales  10/21/2021, 10:28 AM

## 2021-10-21 NOTE — Progress Notes (Signed)
Physical Therapy Treatment Patient Details Name: Oscar Rosales MRN: [id number] DOB: [birth year] Today's Date: 10/21/2021   History of Present Illness Pt is a 56 y/o male admitted secondary to fatigue and syncopal episode at work. 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 Pt also found to have mild nonocclusive pulmonary emboli and started on Heparin drip. PMH including but not limited to HFrEF (EF 25-30% 02/2020), HLD, HTN, CAD, and CKD3b.    PT Comments    Patient progressing slowly towards PT goals. Continues to have deficits relating to weakness, vision, awareness, safety and problem solving. Requires Min-Mod A for gait training with use of RW as pt tends to veer right and run into things in environment on right side needing max cues to scan surroundings and navigate RW. Due to above, pt is a high fall risk. Would be a great AIR candidate. Will follow.    Recommendations for follow up therapy are one component of a multi-disciplinary discharge planning process, led by the attending physician.  Recommendations may be updated based on patient status, additional functional criteria and insurance authorization.  Follow Up Recommendations  Acute inpatient rehab (3hours/day)     Assistance Recommended at Discharge Frequent or constant Supervision/Assistance  Patient can return home with the following A lot of help with walking and/or transfers;A lot of help with bathing/dressing/bathroom;Assistance with cooking/housework;Direct supervision/assist for medications management;Assist for transportation;Help with stairs or ramp for entrance;Direct supervision/assist for financial management   Equipment Recommendations  Rolling walker (2 wheels);Wheelchair (measurements PT);Wheelchair  cushion (measurements PT);BSC/3in1    Recommendations for Other Services       Precautions / Restrictions Precautions Precautions: Fall;Other (comment) Precaution Comments: watch HR, BP Restrictions Weight Bearing Restrictions: No     Mobility  Bed Mobility Overal bed mobility: Needs Assistance Bed Mobility: Supine to Sit     Supine to sit: Min guard, HOB elevated     General bed mobility comments: No physical assist needed, increased time.+ dizziness.    Transfers Overall transfer level: Needs assistance Equipment used: Rolling walker (2 wheels) Transfers: Sit to/from Stand Sit to Stand: Min assist           General transfer comment: Min A to steady in standing with cues for hand placement/technique. transferred to chair post ambulation.    Ambulation/Gait Ambulation/Gait assistance: Min assist, Mod assist Gait Distance (Feet): 80 Feet Assistive device: Rolling walker (2 wheels) Gait Pattern/deviations: Step-through pattern, Decreased step length - right, Decreased dorsiflexion - right, Drifts right/left, Leaning posteriorly, Staggering right Gait velocity: decr     General Gait Details: Slow, unsteady gait running into everything on right side, bumping into things in hallway esp on right. Assist to manually navigate RW as well as verbal directional cues. No awareness of visual deficits in periphery. Needs repeated cues to scan surroundings/environment. 2 standing rest breaks due to SOB.Stumbles often and needs MIn-Mod A for balance.   Stairs             Wheelchair Mobility    Modified Rankin (Stroke Patients Only) Modified Rankin (Stroke Patients Only) Pre-Morbid Rankin Score: No symptoms Modified Rankin: Moderately severe disability     Balance Overall balance assessment: Needs assistance Sitting-balance support: Feet supported, No upper extremity supported Sitting balance-Leahy Scale: Fair Sitting balance - Comments: initally min A to steady  in  sitting with report of some dizziness. Ultimately min G-supervision   Standing balance support: During functional activity, Reliant on assistive device for balance, Bilateral upper extremity supported Standing balance-Leahy Scale: Poor Standing balance comment: Reliant on UE support                            Cognition Arousal/Alertness: Awake/alert Behavior During Therapy: Flat affect Overall Cognitive Status: Impaired/Different from baseline Area of Impairment: Orientation, Attention, Memory, Following commands, Safety/judgement, Awareness, Problem solving                 Orientation Level: Disoriented to, Time, Situation Current Attention Level: Selective Memory: Decreased short-term memory, Decreased recall of precautions Following Commands: Follows one step commands consistently, Follows one step commands with increased time, Follows multi-step commands inconsistently Safety/Judgement: Decreased awareness of safety, Decreased awareness of deficits Awareness: Emergent Problem Solving: Slow processing, Requires verbal cues General Comments: Knows it is February but thinks it is 1980 something. Difficulty keeping eyes opened. Reports no visual deficits despite running into things constantly during mobility. No awareness of deficits/safety.        Exercises      General Comments General comments (skin integrity, edema, etc.): BP pre activity 128/86, BP post activity 122/99      Pertinent Vitals/Pain Pain Assessment Pain Assessment: No/denies pain    Home Living                          Prior Function            PT Goals (current goals can now be found in the care plan section) Progress towards PT goals: Progressing toward goals (slowly)    Frequency    Min 4X/week      PT Plan Current plan remains appropriate    Co-evaluation              AM-PAC PT "6 Clicks" Mobility   Outcome Measure  Help needed turning from your  back to your side while in a flat bed without using bedrails?: A Little Help needed moving from lying on your back to sitting on the side of a flat bed without using bedrails?: A Little Help needed moving to and from a bed to a chair (including a wheelchair)?: A Little Help needed standing up from a chair using your arms (e.g., wheelchair or bedside chair)?: A Little Help needed to walk in hospital room?: A Lot Help needed climbing 3-5 steps with a railing? : Total 6 Click Score: 15    End of Session Equipment Utilized During Treatment: Gait belt Activity Tolerance: Patient tolerated treatment well;Patient limited by fatigue Patient left: in chair;with call bell/phone within reach;with chair alarm set Nurse Communication: Mobility status PT Visit Diagnosis: Other abnormalities of gait and mobility (R26.89);Unsteadiness on feet (R26.81);Muscle weakness (generalized) (M62.81);Difficulty in walking, not elsewhere classified (R26.2);Other symptoms and signs involving the nervous system (R29.898)     Time: 1336-1400 PT Time Calculation (min) (ACUTE ONLY): 24 min  Charges:  $Gait Training: 23-37 mins                     Vale Haven, PT, DPT Acute Rehabilitation Services Pager [phone] Office [phone]      Blake Divine A Lanier Ensign 10/21/2021, 3:30 PM

## 2021-10-22 DIAGNOSIS — I130 Hypertensive heart and chronic kidney disease with heart failure and stage 1 through stage 4 chronic kidney disease, or unspecified chronic kidney disease: Secondary | ICD-10-CM | POA: Diagnosis not present

## 2021-10-22 DIAGNOSIS — I5023 Acute on chronic systolic (congestive) heart failure: Secondary | ICD-10-CM | POA: Diagnosis not present

## 2021-10-22 DIAGNOSIS — I428 Other cardiomyopathies: Secondary | ICD-10-CM | POA: Diagnosis not present

## 2021-10-22 DIAGNOSIS — I639 Cerebral infarction, unspecified: Secondary | ICD-10-CM | POA: Diagnosis not present

## 2021-10-22 DIAGNOSIS — I2699 Other pulmonary embolism without acute cor pulmonale: Secondary | ICD-10-CM | POA: Diagnosis not present

## 2021-10-22 LAB — BASIC METABOLIC PANEL
Anion gap: 6 (ref 5–15)
BUN: 33 mg/dL — ABNORMAL HIGH (ref 6–20)
CO2: 28 mmol/L (ref 22–32)
Calcium: 8 mg/dL — ABNORMAL LOW (ref 8.9–10.3)
Chloride: 96 mmol/L — ABNORMAL LOW (ref 98–111)
Creatinine, Ser: 1.76 mg/dL — ABNORMAL HIGH (ref 0.61–1.24)
GFR, Estimated: 45 mL/min — ABNORMAL LOW (ref >60)
Glucose, Bld: 104 mg/dL — ABNORMAL HIGH (ref 70–99)
Potassium: 4.3 mmol/L (ref 3.5–5.1)
Sodium: 130 mmol/L — ABNORMAL LOW (ref 135–145)

## 2021-10-22 LAB — CBC
HCT: 45.1 % (ref 39.0–52.0)
Hemoglobin: 15.7 g/dL (ref 13.0–17.0)
MCH: 31.8 pg (ref 26.0–34.0)
MCHC: 34.8 g/dL (ref 30.0–36.0)
MCV: 91.5 fL (ref 80.0–100.0)
Platelets: 214 10*3/uL (ref 150–400)
RBC: 4.93 MIL/uL (ref 4.22–5.81)
RDW: 13.6 % (ref 11.5–15.5)
WBC: 9.5 10*3/uL (ref 4.0–10.5)
nRBC: 0 % (ref 0.0–0.2)

## 2021-10-22 LAB — COOXEMETRY PANEL
Carboxyhemoglobin: 1.1 % (ref 0.5–1.5)
Methemoglobin: 0.8 % (ref 0.0–1.5)
O2 Saturation: 73.3 %
Total hemoglobin: 16.5 g/dL — ABNORMAL HIGH (ref 12.0–16.0)

## 2021-10-22 NOTE — Progress Notes (Signed)
Patient ID: Oscar Rosales, male   DOB: 05-16-1966, 56 y.o.   MRN: [id number]     Advanced Heart Failure Rounding Note  PCP-Cardiologist: None   Subjective:    1/30: DBA started for low output, initial Co-ox 44%  2/1 DBA stopped and switched to milrinone due to hypertension 2/5: Milrinone decreased to 0.125 2/6: Milrinone stopped  Stopped milrinone yesterday. Co-ox 76%  Feels ok. Denies SOB, orthopnea or PND. CVP 8  Scr 2.15>2>1.95>1.89 > pending   Objective:   Weight Range: 59 kg Body mass index is 18.14 kg/m.   Vital Signs:   Temp:  [97.3 F (36.3 C)-98 F (36.7 C)] 97.3 F (36.3 C) (02/07 0331) Pulse Rate:  [77-96] 77 (02/07 0331) Resp:  [16-20] 20 (02/07 0331) BP: (131-155)/(87-106) 137/106 (02/06 2327) SpO2:  [95 %-99 %] 96 % (02/07 0331) Weight:  [59 kg] 59 kg (02/07 0354) Last BM Date: 10/20/21  Weight change: Filed Weights   10/20/21 0405 10/21/21 0448 10/22/21 0354  Weight: 58.4 kg 56.4 kg 59 kg    Intake/Output:   Intake/Output Summary (Last 24 hours) at 10/22/2021 0649 Last data filed at 10/22/2021 0000 Gross per 24 hour  Intake 1111.01 ml  Output 1600 ml  Net -488.99 ml       Physical Exam   General:  Chronically ill appearing. Sitting up in bed. HEENT: normal R ptosis Neck: supple.JVP 8 Carotids 2+ bilat; no bruits. No lymphadenopathy or thryomegaly appreciated. Cor: PMI nondisplaced. Regular rate & rhythm. No rubs, gallops or murmurs. Lungs: clear Abdomen: soft, nontender, nondistended. No hepatosplenomegaly. No bruits or masses. Good bowel sounds. Extremities: no cyanosis, clubbing, rash, edema Neuro: alert & orientedx3, R side weak     Telemetry   Sinus 70-80s (personally reviewed)  Labs    CBC Recent Labs    10/21/21 0555 10/22/21 0409  WBC 7.1 9.5  HGB 13.2 15.7  HCT 38.5* 45.1  MCV 93.7 91.5  PLT 166 [id number]    Basic Metabolic Panel Recent Labs    10/20/21 0500 10/21/21 0555  NA 135 133*  K 3.3* 4.2  CL 91* 94*  CO2  32 30  GLUCOSE 122* 106*  BUN 33* 29*  CREATININE 1.95* 1.89*  CALCIUM 8.6* 8.4*  MG 1.8 2.2    Liver Function Tests No results for input(s): AST, ALT, ALKPHOS, BILITOT, PROT, ALBUMIN in the last 72 hours. No results for input(s): LIPASE, AMYLASE in the last 72 hours. Cardiac Enzymes No results for input(s): CKTOTAL, CKMB, CKMBINDEX, TROPONINI in the last 72 hours.  BNP: BNP (last 3 results) Recent Labs    10/11/21 1245  BNP 3,942.1*     ProBNP (last 3 results) No results for input(s): PROBNP in the last 8760 hours.   D-Dimer No results for input(s): DDIMER in the last 72 hours. Hemoglobin A1C No results for input(s): HGBA1C in the last 72 hours. Fasting Lipid Panel No results for input(s): CHOL, HDL, LDLCALC, TRIG, CHOLHDL, LDLDIRECT in the last 72 hours. Thyroid Function Tests No results for input(s): TSH, T4TOTAL, T3FREE, THYROIDAB in the last 72 hours.  Invalid input(s): FREET3   Other results:   Imaging    No results found.   Medications:     Scheduled Medications:  amiodarone  200 mg Oral BID   apixaban  5 mg Oral BID   Chlorhexidine Gluconate Cloth  6 each Topical Daily   feeding supplement  237 mL Oral BID BM   isosorbide-hydrALAZINE  2 tablet Oral TID   multivitamin  with minerals  1 tablet Oral Daily   rosuvastatin  20 mg Oral Daily   sacubitril-valsartan  1 tablet Oral BID   sodium chloride flush  10-40 mL Intracatheter Q12H   spironolactone  12.5 mg Oral Daily    Infusions:  sodium chloride 10 mL/hr (10/21/21 2153)   sodium chloride      PRN Medications: acetaminophen **OR** acetaminophen (TYLENOL) oral liquid 160 mg/5 mL **OR** acetaminophen, guaiFENesin-dextromethorphan, senna-docusate, sodium chloride flush    Patient Profile   56 y.o. male with hx chronic systolic CHF/NICM, HTN, CKD IIIb, hx hepatitis C infection, noncompliance with medical therapy. Admitted with CVA, PE and acute on chronic systolic CHF w/ low output. Has  been [facility] at [facility].  Echo with biventricular hypertrophy/dysfunction EF 15-20% Moderate MR moderate RV dysfunction.  Assessment/Plan   Acute on chronic systolic CHF/NICM: -Diagnosed 2018. Normal coronaries on LHC in 2019. Felt to be due to uncontrolled hypertension. -EF previously recovered to 45-50%.  -EF down to 25-30% in 06/21 in setting of uncontrolled HTN.  -Echo 01/23: EF 15-20%, RV moderately reduced, severe biatrial enlargement, moderate MR, moderate TR, moderate AI -Volume overloaded w/ low output, initial co-ox 44%. DBA later stopped and switched to milrinone 0.25 mcg.  - Milrinone stopped 2/6, Co-ox stable  - CVP 8. Off lasix.  - will add Jardiance 10 - No beta blocker with a/c CHF - Continue Entresto 24/26 mg BID - Add spiro 12.5 mg daily tomorrow if Scr stable - Continue bidil 2 tabs three times a day. CO-Pay 47.00 may need to switch to hydralazine/imdur at d/c -Currently not candidate for advanced therapies currently d/t noncompliance and renal impairment.   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 Scattered infarcts on MRI brain -No large vessel occlusion on CTA head and neck -Negative bubble study on echo - MRI- Scattered infarcts involving the right cerebellar hemisphere, left cerebral peduncle, left hippocampus, left thalamus, right occipital lobe, and left pre and postcentral gyri, some of which appear acute while others appear more subacute in chronicity. - On statin.   - Neuro signed off.  - PT/OT following.  - On TEE schedule for Thursday. Will do tomorrow if we can get a spot  3. PE: -Incidentally noted on CTA chest -On apixaban.   -LE dopplers negative for DVT -Hypercoagulable labs pending   4. AKI on CKD IIIb: -Baseline Scr 1.7 -SCr peaked to 2.22 -->today 2.05-->2.03 -->2.15 -> 2.0 -> 1.95 -> 1.89> pending -Renal US c/w medical renal disease. -suspect cardiorenal from low output HF  5. PVCs /NSVT - Placed on amio drip 2/2 due to increased PVC burden.  -  Few PVCs overnight, no recurrent NSVT - Continue amiodarone 200 bid,  - Wean off milrinone as above  6. HTN - Improved  PT recommending CIR.    Length of Stay: [place], MD  10/22/2021, 6:49 AM  Advanced Heart Failure Team Pager [phone] (M-F; 7a - 5p)  Please contact [facility] for night-coverage after hours (5p -7a ) and weekends on [email or web address]

## 2021-10-22 NOTE — TOC Progression Note (Addendum)
Transition of Care [facility]) - Progression Note    Patient Details  Name: Oscar Rosales MRN: [id number] Date of Birth: September 28, 1965  Transition of Care [facility]) CM/SW Contact  Chequita Mofield, LCSW Phone Number: 10/22/2021, 9:48 AM  Clinical Narrative:    HF CSW spoke with [name] [phone] about the disposition plan and the options including [facility] versus SNF. [name] is open to hearing more about [facility] but is also open to pursuing SNF options in [place] as well. [name] agreeable for the CSW to have [facility] reach out to her.  11:32pm - J. Hasty the admission's coordinator at [facility] reported she spoke with [name] and she is considering IP at [facility] as an option.  CSW will continue to follow throughout discharge.   Expected Discharge Plan: Skilled Nursing Facility Barriers to Discharge: Continued Medical Work up, SNF Pending bed offer, English as a second language teacher  Expected Discharge Plan and Services Expected Discharge Plan: Skilled Nursing Facility In-house Referral: Clinical Social Work Discharge Planning Services: CM Consult Post Acute Care Choice: Skilled Nursing Facility Living arrangements for the past 2 months: Single Family Home                                       Social Determinants of Health (SDOH) Interventions Food Insecurity Interventions: Intervention Not Indicated Housing Interventions: Intervention Not Indicated  Readmission Risk Interventions No flowsheet data found.  Jerell Demery, MSW, LCSWA [phone] Heart Failure Social Worker

## 2021-10-22 NOTE — Progress Notes (Signed)
Physical Therapy Treatment Patient Details Name: Oscar Rosales MRN: [id number] DOB: 01/25/1966 Today's Date: 10/22/2021   History of Present Illness Pt is a 56 y/o male admitted secondary to fatigue and syncopal episode at work. 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 Pt also found to have mild nonocclusive pulmonary emboli and started on Heparin drip. PMH including but not limited to HFrEF (EF 25-30% 02/2020), HLD, HTN, CAD, and CKD3b.    PT Comments    Patient progressing slowly towards PT goals. Demonstrates impaired STM, forgetting things discussed within minutes during session despite contextual cues. Better able to navigate environment in room after discussing compensatory strategies prior to movement however once in hallway veers right and loses balance towards left needing MOd A for support and RW management. RR up to 30, HR up to 130s bpm and Sp02 dropped to mid 80s on RA during activity, recovers well with rest breaks. Will continue to follow.   Recommendations for follow up therapy are one component of a multi-disciplinary discharge planning process, led by the attending physician.  Recommendations may be updated based on patient status, additional functional criteria and insurance authorization.  Follow Up Recommendations  Acute inpatient rehab (3hours/day)     Assistance Recommended at Discharge Frequent or constant Supervision/Assistance  Patient can return home with the following A lot of help with walking and/or transfers;A lot of help with bathing/dressing/bathroom;Assistance with cooking/housework;Direct supervision/assist for medications management;Assist for transportation;Help with stairs or ramp for entrance;Direct supervision/assist for financial  management   Equipment Recommendations  Rolling walker (2 wheels);Wheelchair (measurements PT);Wheelchair cushion (measurements PT);BSC/3in1    Recommendations for Other Services       Precautions / Restrictions Precautions Precautions: Fall;Other (comment) Precaution Comments: watch HR, BP Restrictions Weight Bearing Restrictions: No     Mobility  Bed Mobility               General bed mobility comments: Up in chair upon PT arrival.    Transfers Overall transfer level: Needs assistance Equipment used: Rolling walker (2 wheels) Transfers: Sit to/from Stand Sit to Stand: Min assist           General transfer comment: Min A to power and steady in standing with tactile cues for hand placement/technique.    Ambulation/Gait Ambulation/Gait assistance: Mod assist Gait Distance (Feet): 80 Feet Assistive device: Rolling walker (2 wheels) Gait Pattern/deviations: Step-through pattern, Decreased step length - right, Decreased dorsiflexion - right, Drifts right/left, Leaning posteriorly, Staggering right Gait velocity: decr     General Gait Details: Slow, unsteady gait with left lateral lean and LOB but veering right; needs constant cues for straight gaze  Assist to manually navigate RW as well as verbal directional cues. Initially able to navigate room well with obstacles after discussing plan prior to exiting room but then running into things again on right.. Needs repeated cues to scan surroundings/environment. 3 standing rest breaks due to SOB. RR up to 30. Stumbles often and needs Mod A for balance. HR up to 130s bpm. Sp02 dropped to mid 80s on RA.   Stairs             Wheelchair Mobility    Modified Rankin (Stroke Patients Only) Modified Rankin (Stroke Patients Only) Pre-Morbid Rankin Score: No symptoms Modified Rankin: Moderately severe disability     Balance  Overall balance assessment: Needs assistance Sitting-balance support: Feet supported, No  upper extremity supported Sitting balance-Leahy Scale: Fair     Standing balance support: Reliant on assistive device for balance, Bilateral upper extremity supported Standing balance-Leahy Scale: Poor Standing balance comment: requires external support in standing                            Cognition Arousal/Alertness: Awake/alert Behavior During Therapy: Flat affect Overall Cognitive Status: Impaired/Different from baseline Area of Impairment: Orientation, Attention, Memory, Following commands, Safety/judgement, Awareness                 Orientation Level: Disoriented to, Time, Situation Current Attention Level: Selective Memory: Decreased short-term memory, Decreased recall of precautions Following Commands: Follows one step commands consistently, Follows one step commands with increased time, Follows multi-step commands inconsistently Safety/Judgement: Decreased awareness of safety, Decreased awareness of deficits Awareness: Emergent Problem Solving: Slow processing, Requires verbal cues General Comments: Poor STM, forgets things within mins despite repetition and contextual cues. not able to state any deficits from CVA. verbalizes needs to scan environment with prompting and repetition.        Exercises      General Comments General comments (skin integrity, edema, etc.): RR up to 30, HR up to 130s bpm and Sp02 dropped to mid 80s on RA during activity, recovers well with rest breaks.      Pertinent Vitals/Pain Pain Assessment Pain Assessment: No/denies pain    Home Living                          Prior Function            PT Goals (current goals can now be found in the care plan section) Progress towards PT goals: Goals met and updated - see care plan    Frequency    Min 4X/week      PT Plan Current plan remains appropriate    Co-evaluation              AM-PAC PT "6 Clicks" Mobility   Outcome Measure  Help needed  turning from your back to your side while in a flat bed without using bedrails?: A Little Help needed moving from lying on your back to sitting on the side of a flat bed without using bedrails?: A Little Help needed moving to and from a bed to a chair (including a wheelchair)?: A Little Help needed standing up from a chair using your arms (e.g., wheelchair or bedside chair)?: A Little Help needed to walk in hospital room?: A Lot Help needed climbing 3-5 steps with a railing? : Total 6 Click Score: 15    End of Session Equipment Utilized During Treatment: Gait belt Activity Tolerance: Patient tolerated treatment well Patient left: in chair;with call bell/phone within reach;with chair alarm set Nurse Communication: Mobility status PT Visit Diagnosis: Other abnormalities of gait and mobility (R26.89);Unsteadiness on feet (R26.81);Muscle weakness (generalized) (M62.81);Difficulty in walking, not elsewhere classified (R26.2);Other symptoms and signs involving the nervous system (R29.898)     Time: 1300-1320 PT Time Calculation (min) (ACUTE ONLY): 20 min  Charges:  $Gait Training: 8-22 mins                     Marisa Severin, PT, DPT Acute Rehabilitation Services Pager [phone] Office [place] 10/22/2021, 2:42 PM

## 2021-10-22 NOTE — Progress Notes (Signed)
Subjective: No acute overnight events.   Patient is seen at bedside during rounds this morning. Pt is awake and interactive. He is responsive to questions. He reports improvement in strength.      Stable mental status with no new acute focal deficits.  Objective:  Vital signs in last 24 hours: Vitals:   10/22/21 0331 10/22/21 0354 10/22/21 0657 10/22/21 1122  BP:   (!) 145/110 (!) 146/111  Pulse: 77  86 99  Resp: 20  16 20   Temp: (!) 97.3 F (36.3 C)  97.7 F (36.5 C) 97.9 F (36.6 C)  TempSrc: Oral  Oral Oral  SpO2: 96%  99% 97%  Weight:  59 kg    Height:       Constitutional: sleepy but wakes up to voice  HENT: normocephalic, atraumatic, mucous membranes moist Cardiovascular: RRR, no m/r/g, trace bilateral LE edema  Pulmonary/Chest: diminished breath sounds bilaterally.  Extremities: No twitching of the extremities noted   Neurological: A&O to person placed and situation, and possibly time.   Bilateral ptosis is present.  5/5 strength of right upper and lower extremity 5/5 strength of left upper and lower extremities  Assessment/Plan:  Principal Problem:   Acute CVA (cerebrovascular accident) ([place]) Active Problems:   Hypertension, uncontrolled   Non-ischemic cardiomyopathy ([place])   Stage 3 chronic kidney disease ([place])   Acute pulmonary embolism ([place])   Acute on chronic congestive heart failure ([place])   Malnutrition of moderate degree  Oscar Rosales is a 56 y.o. male with a pertinent PMH of nonischemic cardiomyopathy, HFrEF (EF 25-30% 02/2020), HLD, HTN, and CKD3b admitted for acute ischemic stroke 2/2 embolic infarctions, mild nonocclusive pulmonary emboli, and acute on chronic HFrEF exacerbation.  Multifocal ischemic brain infarcts Suspecting embolic stroke Twitching and lethargy, improved  Marked improvement in multifactorial encephalopathy related to respiratory and cardiac failure. TEE Thursday, and cardiac monitor at DC. BP much improved, and closer to goal  with recent addition of Bidil, Entresto, and Spiro  - TEE tmrw at 1130 am  - Continue Bidil tid  - Continue Entresto 24/26 bid  - Continue Spiro 12.5  - Continue Crestor 20 mg - Telemetry  - Frequent neuro checks  - PT/OT/SLP following - TOC following: denied for [facility], ?Possibly candidate for [facility] per TOC   Mild nonocclusive pulmonary emboli, source unclear  Hypercoagulably workup largely unremarkable. TTE LVEF 15-20% with severely decreased left ventricle function.  - TEE Thursday  - Started on Eliquis 5mg  bid 2/4 per cardiology   AoC HFrEF exacerbation Nonischemic hypertensive cardiomyopathy Acute pulmonary edema in the s/o hypertensive crisis, resolved  Uncontrolled HTN Euvolemic on exam. Diuresed 6.3L with IV Lasix. BP better controlled.  - Advanced HF team following, appreciate assistance; feel that pt is ready for CIR/SNF soon  - Continue Bidil  - Continue Entresto 24/26 bid  - Continue spiro 12.5     - Continue Jardiance 10 today     - Amiodarone for PVCs - Daily weights and I/O's - Cardiac monitoring  - Holding BB in s/o acute on chronic HF   AKI on CKD3b Likely cardiorenal from low output HF. Baseline Cr 1.6-1.8, approaching close to baseline after Lasix discontinuation, with bump today, from 1.8 yesterday -> 1.9 today  - Trend BMP - Avoid nephrotoxins    Stable ascending thoracic aortic dilatation  4.2 cm in size. - Annual imaging follow-up recommended by CTA or MRA    Best Practice: Diet: Dysphagia 3  IVF: None, None VTE: Eliquis  Code: Full  [name], MD  Internal Medicine Resident, PGY-1 Pager: [phone] After 5pm on weekdays and 1pm on weekends: On Call pager [phone]

## 2021-10-22 NOTE — Plan of Care (Signed)
°  Problem: Ischemic Stroke/TIA Tissue Perfusion: Goal: Complications of ischemic stroke/TIA will be minimized Outcome: Progressing   Problem: Education: Goal: Knowledge of General Education information will improve Description: Including pain rating scale, medication(s)/side effects and non-pharmacologic comfort measures Outcome: Progressing   Problem: Health Behavior/Discharge Planning: Goal: Ability to manage health-related needs will improve Outcome: Progressing   Problem: Activity: Goal: Risk for activity intolerance will decrease Outcome: Progressing   Problem: Nutrition: Goal: Adequate nutrition will be maintained Outcome: Progressing   Problem: Coping: Goal: Level of anxiety will decrease Outcome: Progressing   Problem: Pain Managment: Goal: General experience of comfort will improve Outcome: Progressing

## 2021-10-22 NOTE — Progress Notes (Signed)
Mobility Specialist Criteria Algorithm Info.    10/22/21 1149  Mobility  Activity Ambulated with assistance in room;Transferred from bed to chair  Range of Motion/Exercises Active;All extremities  Level of Assistance Minimal assist, patient does 75% or more  Assistive Device Front wheel walker  Distance Ambulated (ft) 15 ft  Activity Response Tolerated well   Patient received in bed agreeable to participate. Deferred hallway ambulation but requested assistance to chair for lunch. Required minimal HHA to EOB, stood min guard w/ cues for hand and foot placement. Ambulated short distance around bed to chair, min A needing mod-max cues. Was left in chair with all needs met and NT present.   10/22/2021 1:06 PM  Oscar Rosales, [place], [place]  IQYPD:[phone] Office: [phone]

## 2021-10-22 NOTE — Plan of Care (Signed)
°  Problem: Education: Goal: Knowledge of General Education information will improve Description: Including pain rating scale, medication(s)/side effects and non-pharmacologic comfort measures Outcome: Progressing   Problem: Health Behavior/Discharge Planning: Goal: Ability to manage health-related needs will improve Outcome: Progressing   Problem: Activity: Goal: Risk for activity intolerance will decrease Outcome: Progressing   Problem: Nutrition: Goal: Adequate nutrition will be maintained Outcome: Progressing   Problem: Coping: Goal: Level of anxiety will decrease Outcome: Progressing   Problem: Pain Managment: Goal: General experience of comfort will improve Outcome: Progressing   Problem: Elimination: Goal: Will not experience complications related to bowel motility Outcome: Not Applicable Goal: Will not experience complications related to urinary retention Outcome: Not Applicable

## 2021-10-23 DIAGNOSIS — I5023 Acute on chronic systolic (congestive) heart failure: Secondary | ICD-10-CM | POA: Diagnosis not present

## 2021-10-23 DIAGNOSIS — I639 Cerebral infarction, unspecified: Secondary | ICD-10-CM | POA: Diagnosis not present

## 2021-10-23 LAB — BASIC METABOLIC PANEL
Anion gap: 7 (ref 5–15)
BUN: 32 mg/dL — ABNORMAL HIGH (ref 6–20)
CO2: 25 mmol/L (ref 22–32)
Calcium: 8 mg/dL — ABNORMAL LOW (ref 8.9–10.3)
Chloride: 99 mmol/L (ref 98–111)
Creatinine, Ser: 1.9 mg/dL — ABNORMAL HIGH (ref 0.61–1.24)
GFR, Estimated: 41 mL/min — ABNORMAL LOW (ref >60)
Glucose, Bld: 103 mg/dL — ABNORMAL HIGH (ref 70–99)
Potassium: 4.4 mmol/L (ref 3.5–5.1)
Sodium: 131 mmol/L — ABNORMAL LOW (ref 135–145)

## 2021-10-23 LAB — COOXEMETRY PANEL
Carboxyhemoglobin: 1 % (ref 0.5–1.5)
Methemoglobin: 0.8 % (ref 0.0–1.5)
O2 Saturation: 69.7 %
Total hemoglobin: 16.1 g/dL — ABNORMAL HIGH (ref 12.0–16.0)

## 2021-10-23 LAB — AMIODARONE LEVEL
Amiodarone Lvl: 1402 ng/mL (ref 1000–2500)
N-Desethyl-Amiodarone: <100 ng/mL

## 2021-10-23 LAB — MAGNESIUM: Magnesium: 2 mg/dL (ref 1.7–2.4)

## 2021-10-23 MED ORDER — EMPAGLIFLOZIN 10 MG PO TABS
10.0000 mg | ORAL_TABLET | Freq: Every day | ORAL | Status: DC
  Administered 2021-10-23 – 2021-10-24 (×2): 10 mg via ORAL
  Filled 2021-10-23 (×2): qty 1

## 2021-10-23 NOTE — Progress Notes (Signed)
Subjective: No acute overnight events.   Patient is seen at bedside during rounds this morning prior to his TEE. Pt is awake and interactive. He is responsive to questions. He reports improvement in strength.      Stable mental status with no new acute focal deficits.  Objective:  Vital signs in last 24 hours: Vitals:   10/23/21 1600 10/23/21 1623 10/23/21 1700 10/23/21 1800  BP:  123/89  97/78  Pulse: 78 85 85 (!) 103  Resp: (!) 0 17 (!) 23 (!) 30  Temp:  97.7 F (36.5 C)    TempSrc:      SpO2: 95% 95% 96% 94%  Weight:      Height:       Constitutional: sleepy but wakes up to voice  HENT: normocephalic, atraumatic, mucous membranes moist Cardiovascular: RRR, no m/r/g, no bilateral LE edema  Pulmonary/Chest: diminished breath sounds bilaterally.  Extremities: No twitching of the extremities noted   Neurological: A&O to person placed and situation, and possibly time.   Bilateral ptosis is present.  5/5 strength of right upper and lower extremity 5/5 strength of left upper and lower extremities  Assessment/Plan:  Principal Problem:   Acute CVA (cerebrovascular accident) ([place]) Active Problems:   Hypertension, uncontrolled   Non-ischemic cardiomyopathy ([place])   Stage 3 chronic kidney disease ([place])   Acute pulmonary embolism ([place])   Acute on chronic congestive heart failure ([place])   Malnutrition of moderate degree  Oscar Rosales is a 56 y.o. male with a pertinent PMH of nonischemic cardiomyopathy, HFrEF (EF 25-30% 02/2020), HLD, HTN, and CKD3b admitted for acute ischemic stroke 2/2 embolic infarctions, mild nonocclusive pulmonary emboli, and acute on chronic HFrEF exacerbation.  Multifocal ischemic brain infarcts Suspecting embolic stroke Twitching and lethargy, improved  Marked improvement in multifactorial encephalopathy related to respiratory and cardiac failure. TEE revealing no evidence of PFO or shunting. BP at goal with recent addition of Bidil, Entresto, and Spiro,  with HR's in 80's.  - Continue Bidil tid  - Continue Entresto 24/26 bid  - Continue Spiro 12.5  - Continue Crestor 20 mg - Telemetry  - Frequent neuro checks  - PT/OT/SLP following - Cardiac monitoring at discharge  - Stable for d/c, pending [facility] approval    Mild nonocclusive pulmonary emboli, source unclear  Hypercoagulably workup largely unremarkable. TEE revealing no evidence of PFO or shunting. - Continue Eliquis 5mg  bid   AoC HFrEF exacerbation Nonischemic hypertensive cardiomyopathy Acute pulmonary edema in the s/o hypertensive crisis, resolved  Uncontrolled HTN Euvolemic on exam. Diuresed 6.3L with IV Lasix. BP at goal. TEE today revealing EF 10% with severe RV dysfunction - Advanced HF team following, appreciate assistance  - Continue Bidil  - Continue Entresto 24/26 bid  - Continue spiro 12.5     - Holding Jardiance in setting of AKI (see below)     - Amiodarone for PVCs - Daily weights and I/O's - Cardiac monitoring  - Holding BB in s/o acute on chronic HF   AKI on CKD3b Baseline Cr 1.6-1.8; Cr near 2 this admission during aggressive diuresis. Bump to 2.18 today.  - Holding Jardiance  - 500cc IVF per cards d/t volume status and poor po intake - Trend BMP - Avoid nephrotoxins    Stable ascending thoracic aortic dilatation  4.2 cm in size. - Annual imaging follow-up recommended by CTA or MRA    Best Practice: Diet: Dysphagia 3  IVF: None, None VTE: Eliquis Code: Full  [name], MD  Internal Medicine Resident, PGY-1 Pager: [phone] After 5pm on weekdays and 1pm on weekends: On Call pager [phone]

## 2021-10-23 NOTE — Plan of Care (Signed)
°  Problem: Ischemic Stroke/TIA Tissue Perfusion: Goal: Complications of ischemic stroke/TIA will be minimized Outcome: Progressing   Problem: Education: Goal: Knowledge of General Education information will improve Description: Including pain rating scale, medication(s)/side effects and non-pharmacologic comfort measures Outcome: Progressing   Problem: Activity: Goal: Risk for activity intolerance will decrease Outcome: Progressing   Problem: Nutrition: Goal: Adequate nutrition will be maintained Outcome: Progressing   Problem: Coping: Goal: Level of anxiety will decrease Outcome: Progressing   Problem: Pain Managment: Goal: General experience of comfort will improve Outcome: Progressing   Problem: Safety: Goal: Ability to remain free from injury will improve Outcome: Progressing

## 2021-10-23 NOTE — H&P (View-Only) (Signed)
Patient ID: Oscar Rosales, male   DOB: 02/03/1966, 56 y.o.   MRN: [id number] °  ° ° Advanced Heart Failure Rounding Note ° °PCP-Cardiologist: None  ° °Subjective:   ° °1/30: DBA started for low output, initial Co-ox 44%  °2/1 DBA stopped and switched to milrinone due to hypertension °2/5: Milrinone decreased to 0.125 °2/6: Milrinone stopped ° °Co-ox stable off milrinone, 70%. CVP 6  ° °Scr 2.15>2>1.95>1.89 > 1.76>>1.90  ° °Had 14 beat run of NSVT on yesterday evening. Asymptomatic.  °K 4.4 °Mg pending  ° °Feels ok today. Denies dyspnea. Right side getting stronger. No complaints.  ° ° °Objective:   °Weight Range: °57.3 kg °Body mass index is 17.62 kg/m².  ° °Vital Signs:   °Temp:  [97.8 °F (36.6 °C)-98.5 °F (36.9 °C)] 97.8 °F (36.6 °C) (02/08 0720) °Pulse Rate:  [80-111] 80 (02/08 0720) °Resp:  [14-31] 18 (02/08 0720) °BP: (102-146)/(81-111) 115/87 (02/08 0720) °SpO2:  [92 %-97 %] 95 % (02/08 0720) °Weight:  [57.3 kg] 57.3 kg (02/08 0439) °Last BM Date: 10/22/21 ° °Weight change: °Filed Weights  ° 10/21/21 0448 10/22/21 0354 10/23/21 0439  °Weight: 56.4 kg 59 kg 57.3 kg  ° ° °Intake/Output:  ° °Intake/Output Summary (Last 24 hours) at 10/23/2021 0837 °Last data filed at 10/23/2021 0400 °Gross per 24 hour  °Intake 928.72 ml  °Output 1450 ml  °Net -521.28 ml  °  ° ° °Physical Exam  °CVP 6  °General:  fatigued appearing. No respiratory difficulty °HEENT: normal °Neck: supple. JVD 6 cm. Carotids 2+ bilat; no bruits. No lymphadenopathy or thyromegaly appreciated. °Cor: PMI nondisplaced. Regular rate & rhythm. No rubs, gallops or murmurs. °Lungs: clear °Abdomen: soft, nontender, nondistended. No hepatosplenomegaly. No bruits or masses. Good bowel sounds. °Extremities: no cyanosis, clubbing, rash, edema °Neuro: alert & oriented x 3, cranial nerves grossly intact. moves all 4 extremities w/o difficulty. Affect pleasant. ° ° °Telemetry  ° °NSR 80s, 14 beat run NSVT 2/7  (personally reviewed) ° °Labs  °  °CBC °Recent Labs  °   10/21/21 °0555 10/22/21 °0409  °WBC 7.1 9.5  °HGB 13.2 15.7  °HCT 38.5* 45.1  °MCV 93.7 91.5  °PLT 166 214  ° °Basic Metabolic Panel °Recent Labs  °  10/21/21 °0555 10/22/21 °0409 10/23/21 °0400  °NA 133* 130* 131*  °K 4.2 4.3 4.4  °CL 94* 96* 99  °CO2 30 28 25  °GLUCOSE 106* 104* 103*  °BUN 29* 33* 32*  °CREATININE 1.89* 1.76* 1.90*  °CALCIUM 8.4* 8.0* 8.0*  °MG 2.2  --   --   ° °Liver Function Tests °No results for input(s): AST, ALT, ALKPHOS, BILITOT, PROT, ALBUMIN in the last 72 hours. °No results for input(s): LIPASE, AMYLASE in the last 72 hours. °Cardiac Enzymes °No results for input(s): CKTOTAL, CKMB, CKMBINDEX, TROPONINI in the last 72 hours. ° °BNP: °BNP (last 3 results) °Recent Labs  °  10/11/21 °1245  °BNP 3,942.1*  ° ° °ProBNP (last 3 results) °No results for input(s): PROBNP in the last 8760 hours. ° ° °D-Dimer °No results for input(s): DDIMER in the last 72 hours. °Hemoglobin A1C °No results for input(s): HGBA1C in the last 72 hours. °Fasting Lipid Panel °No results for input(s): CHOL, HDL, LDLCALC, TRIG, CHOLHDL, LDLDIRECT in the last 72 hours. °Thyroid Function Tests °No results for input(s): TSH, T4TOTAL, T3FREE, THYROIDAB in the last 72 hours. ° °Invalid input(s): FREET3 ° ° °Other results: ° ° °Imaging  ° ° °No results found. ° ° °Medications:   ° ° °  Scheduled Medications: ° amiodarone  200 mg Oral BID  ° apixaban  5 mg Oral BID  ° Chlorhexidine Gluconate Cloth  6 each Topical Daily  ° feeding supplement  237 mL Oral BID BM  ° isosorbide-hydrALAZINE  2 tablet Oral TID  ° multivitamin with minerals  1 tablet Oral Daily  ° rosuvastatin  20 mg Oral Daily  ° sacubitril-valsartan  1 tablet Oral BID  ° sodium chloride flush  10-40 mL Intracatheter Q12H  ° spironolactone  12.5 mg Oral Daily  ° ° °Infusions: ° sodium chloride 10 mL/hr (10/21/21 2153)  ° sodium chloride    ° ° °PRN Medications: °acetaminophen **OR** acetaminophen (TYLENOL) oral liquid 160 mg/5 mL **OR** acetaminophen,  guaiFENesin-dextromethorphan, senna-docusate, sodium chloride flush ° ° ° °Patient Profile  ° °56 y.o. male with hx chronic systolic CHF/NICM, HTN, CKD IIIb, hx hepatitis C infection, noncompliance with medical therapy. Admitted with CVA, PE and acute on chronic systolic CHF w/ low output. Has been followed at [facility]. ° °Echo with biventricular hypertrophy/dysfunction EF 15-20% Moderate MR moderate RV dysfunction. ° °Assessment/Plan  ° °Acute on chronic systolic CHF/NICM: °-Diagnosed 2018. Normal coronaries on LHC in 2019. Felt to be due to uncontrolled hypertension. °-EF previously recovered to 45-50%.  °-EF down to 25-30% in 06/21 in setting of uncontrolled HTN.  °-Echo 01/23: EF 15-20%, RV moderately reduced, severe biatrial enlargement, moderate MR, moderate TR, moderate AI °-Volume overloaded w/ low output, initial co-ox 44%. DBA later stopped and switched to milrinone 0.25 mcg.  °- Milrinone stopped 2/6, Co-ox stable 70% today  °- CVP 6. Off lasix.  °- will add Jardiance 10 mg (follow renal fx)  °- No beta blocker with a/c CHF °- Continue Entresto 24/26 mg BID °- Continue Spiro 12.5 mg daily  °- Continue bidil 2 tabs three times a day. CO-Pay 47.00 may need to switch to hydralazine/imdur at d/c °-Currently not candidate for advanced therapies currently d/t noncompliance and renal impairment. °  °2. 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 Scattered infarcts on MRI brain °-No large vessel occlusion on CTA head and neck °-Negative bubble study on echo °- MRI- Scattered infarcts involving the right cerebellar hemisphere, left cerebral peduncle, left hippocampus, left thalamus, right occipital lobe, and left pre and postcentral gyri, some of which appear acute while others appear more subacute in chronicity. °- On statin.  °- Neuro signed off.  °- PT/OT following. Needs CIR  °- On TEE schedule for Thursday.  ° °3. PE: °-Incidentally noted on CTA chest °-On apixaban.   °-LE dopplers negative for DVT °-Hypercoagulable labs pending °   °4. AKI on CKD IIIb: °-Baseline Scr 1.7 °-SCr peaked to 2.22 -->today 2.05-->2.03 -->2.15 -> 2.0 -> 1.95 -> 1.89> 1.76>1.90  °-Renal US c/w medical renal °disease. °-suspect cardiorenal from low output HF ° °5. PVCs /NSVT °- Placed on amio drip 2/2 due to increased PVC burden.  °- Few PVCs overnight 14 beat run NSVT 2/7 °- Continue amiodarone 200 bid °- if further NSVT, may need LifeVest  °- Keep K >4.0 and Mg > 2.0  ° °6. HTN °- Improved ° °7. Deconditioning  °- PT recommending CIR. Order placed  ° ° °Length of Stay: 11 ° °Oscar Simmons, PA-C  °10/23/2021, 8:37 AM ° °Advanced Heart Failure Team °Pager [phone] (M-F; 7a - 5p)  °Please contact [facility] for night-coverage after hours (5p -7a ) and weekends on [email or web address] ° °Patient seen and examined with the above-signed Advanced Practice Provider and/or Housestaff. I personally reviewed laboratory data,   imaging studies and relevant notes. I independently examined the patient and formulated the important aspects of the plan. I have edited the note to reflect any of my changes or salient points. I have personally discussed the plan with the patient and/or family. ° °Sitting up in chair eating breakfast. Denies CP or SOB. Co-ox and CVP stable off milrinone. Had NSVT overnight. SCr stable.  ° °General:  Sitting in chair No resp difficulty °HEENT: normal + ptosis °Neck: supple. no JVD. Carotids 2+ bilat; no bruits. No lymphadenopathy or thryomegaly appreciated. °Cor: PMI nondisplaced. Regular rate & rhythm. No rubs, gallops or murmurs. °Lungs: clear °Abdomen: soft, nontender, nondistended. No hepatosplenomegaly. No bruits or masses. Good bowel sounds. °Extremities: no cyanosis, clubbing, rash, edema °Neuro: alert & orientedx3, cranial nerves grossly intact. Weak on R ° °HF remains stable off of milrinone. Volume status improved. Now of lasix. Agree with adding Jardiance. On amio for PVCs/NSVT. Keep K > 4.0 Mg > 2.0. ° °Plan TEE tomorrow. Procedure discussed.  Hopefully can go to CIR soon after TEE.  ° °Hager Compston MD ° °

## 2021-10-23 NOTE — Progress Notes (Addendum)
Patient ID: Oscar Rosales, male   DOB: 1966-08-30, 56 y.o.   MRN: [id number]     Advanced Heart Failure Rounding Note  PCP-Cardiologist: None   Subjective:    1/30: DBA started for low output, initial Co-ox 44%  2/1 DBA stopped and switched to milrinone due to hypertension 2/5: Milrinone decreased to 0.125 2/6: Milrinone stopped  Co-ox stable off milrinone, 70%. CVP 6   Scr 2.15>2>1.95>1.89 > 1.76>>1.90   Had 14 beat run of NSVT on yesterday evening. Asymptomatic.  K 4.4 Mg pending   Feels ok today. Denies dyspnea. Right side getting stronger. No complaints.    Objective:   Weight Range: 57.3 kg Body mass index is 17.62 kg/m.   Vital Signs:   Temp:  [97.8 F (36.6 C)-98.5 F (36.9 C)] 97.8 F (36.6 C) (02/08 0720) Pulse Rate:  [80-111] 80 (02/08 0720) Resp:  [14-31] 18 (02/08 0720) BP: (102-146)/(81-111) 115/87 (02/08 0720) SpO2:  [92 %-97 %] 95 % (02/08 0720) Weight:  [57.3 kg] 57.3 kg (02/08 0439) Last BM Date: 10/22/21  Weight change: Filed Weights   10/21/21 0448 10/22/21 0354 10/23/21 0439  Weight: 56.4 kg 59 kg 57.3 kg    Intake/Output:   Intake/Output Summary (Last 24 hours) at 10/23/2021 0837 Last data filed at 10/23/2021 0400 Gross per 24 hour  Intake 928.72 ml  Output 1450 ml  Net -521.28 ml      Physical Exam  CVP 6  General:  fatigued appearing. No respiratory difficulty HEENT: normal Neck: supple. JVD 6 cm. Carotids 2+ bilat; no bruits. No lymphadenopathy or thyromegaly appreciated. Cor: PMI nondisplaced. Regular rate & rhythm. No rubs, gallops or murmurs. Lungs: clear Abdomen: soft, nontender, nondistended. No hepatosplenomegaly. No bruits or masses. Good bowel sounds. Extremities: no cyanosis, clubbing, rash, edema Neuro: alert & oriented x 3, cranial nerves grossly intact. moves all 4 extremities w/o difficulty. Affect pleasant.   Telemetry   NSR 80s, 14 beat run NSVT 2/7  (personally reviewed)  Labs    CBC Recent Labs     10/21/21 0555 10/22/21 0409  WBC 7.1 9.5  HGB 13.2 15.7  HCT 38.5* 45.1  MCV 93.7 91.5  PLT 166 [id number]   Basic Metabolic Panel Recent Labs    10/21/21 0555 10/22/21 0409 10/23/21 0400  NA 133* 130* 131*  K 4.2 4.3 4.4  CL 94* 96* 99  CO2 30 28 25   GLUCOSE 106* 104* 103*  BUN 29* 33* 32*  CREATININE 1.89* 1.76* 1.90*  CALCIUM 8.4* 8.0* 8.0*  MG 2.2  --   --    Liver Function Tests No results for input(s): AST, ALT, ALKPHOS, BILITOT, PROT, ALBUMIN in the last 72 hours. No results for input(s): LIPASE, AMYLASE in the last 72 hours. Cardiac Enzymes No results for input(s): CKTOTAL, CKMB, CKMBINDEX, TROPONINI in the last 72 hours.  BNP: BNP (last 3 results) Recent Labs    10/11/21 1245  BNP 3,942.1*    ProBNP (last 3 results) No results for input(s): PROBNP in the last 8760 hours.   D-Dimer No results for input(s): DDIMER in the last 72 hours. Hemoglobin A1C No results for input(s): HGBA1C in the last 72 hours. Fasting Lipid Panel No results for input(s): CHOL, HDL, LDLCALC, TRIG, CHOLHDL, LDLDIRECT in the last 72 hours. Thyroid Function Tests No results for input(s): TSH, T4TOTAL, T3FREE, THYROIDAB in the last 72 hours.  Invalid input(s): FREET3   Other results:   Imaging    No results found.   Medications:  Scheduled Medications:  amiodarone  200 mg Oral BID   apixaban  5 mg Oral BID   Chlorhexidine Gluconate Cloth  6 each Topical Daily   feeding supplement  237 mL Oral BID BM   isosorbide-hydrALAZINE  2 tablet Oral TID   multivitamin with minerals  1 tablet Oral Daily   rosuvastatin  20 mg Oral Daily   sacubitril-valsartan  1 tablet Oral BID   sodium chloride flush  10-40 mL Intracatheter Q12H   spironolactone  12.5 mg Oral Daily    Infusions:  sodium chloride 10 mL/hr (10/21/21 2153)   sodium chloride      PRN Medications: acetaminophen **OR** acetaminophen (TYLENOL) oral liquid 160 mg/5 mL **OR** acetaminophen,  guaiFENesin-dextromethorphan, senna-docusate, sodium chloride flush    Patient Profile   56 y.o. male with hx chronic systolic CHF/NICM, HTN, CKD IIIb, hx hepatitis C infection, noncompliance with medical therapy. Admitted with CVA, PE and acute on chronic systolic CHF w/ low output. Has been followed at [facility].  Echo with biventricular hypertrophy/dysfunction EF 15-20% Moderate MR moderate RV dysfunction.  Assessment/Plan   Acute on chronic systolic CHF/NICM: -Diagnosed 2018. Normal coronaries on LHC in 2019. Felt to be due to uncontrolled hypertension. -EF previously recovered to 45-50%.  -EF down to 25-30% in 06/21 in setting of uncontrolled HTN.  -Echo 01/23: EF 15-20%, RV moderately reduced, severe biatrial enlargement, moderate MR, moderate TR, moderate AI -Volume overloaded w/ low output, initial co-ox 44%. DBA later stopped and switched to milrinone 0.25 mcg.  - Milrinone stopped 2/6, Co-ox stable 70% today  - CVP 6. Off lasix.  - will add Jardiance 10 mg (follow renal fx)  - No beta blocker with a/c CHF - Continue Entresto 24/26 mg BID - Continue Spiro 12.5 mg daily  - Continue bidil 2 tabs three times a day. CO-Pay 47.00 may need to switch to hydralazine/imdur at d/c -Currently not candidate for advanced therapies currently d/t noncompliance and renal impairment.   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 Scattered infarcts on MRI brain -No large vessel occlusion on CTA head and neck -Negative bubble study on echo - MRI- Scattered infarcts involving the right cerebellar hemisphere, left cerebral peduncle, left hippocampus, left thalamus, right occipital lobe, and left pre and postcentral gyri, some of which appear acute while others appear more subacute in chronicity. - On statin.  - Neuro signed off.  - PT/OT following. Needs CIR  - On TEE schedule for Thursday.   3. PE: -Incidentally noted on CTA chest -On apixaban.   -LE dopplers negative for DVT -Hypercoagulable labs pending    4. AKI on CKD IIIb: -Baseline Scr 1.7 -SCr peaked to 2.22 -->today 2.05-->2.03 -->2.15 -> 2.0 -> 1.95 -> 1.89> 1.76>1.90  -Renal US c/w medical renal disease. -suspect cardiorenal from low output HF  5. PVCs /NSVT - Placed on amio drip 2/2 due to increased PVC burden.  - Few PVCs overnight 14 beat run NSVT 2/7 - Continue amiodarone 200 bid - if further NSVT, may need LifeVest  - Keep K >4.0 and Mg > 2.0   6. HTN - Improved  7. Deconditioning  - PT recommending CIR. Order placed    Length of Stay: [address] PA-C  10/23/2021, 8:37 AM  Advanced Heart Failure Team Pager [phone] (M-F; 7a - 5p)  Please contact [facility] for night-coverage after hours (5p -7a ) and weekends on [email or web address]  Patient seen and examined with the above-signed Advanced Practice Provider and/or Housestaff. I personally reviewed laboratory data,  imaging studies and relevant notes. I independently examined the patient and formulated the important aspects of the plan. I have edited the note to reflect any of my changes or salient points. I have personally discussed the plan with the patient and/or family.  Sitting up in chair eating breakfast. Denies CP or SOB. Co-ox and CVP stable off milrinone. Had NSVT overnight. SCr stable.   General:  Sitting in chair No resp difficulty HEENT: normal + ptosis Neck: supple. no JVD. Carotids 2+ bilat; no bruits. No lymphadenopathy or thryomegaly appreciated. Cor: PMI nondisplaced. Regular rate & rhythm. No rubs, gallops or murmurs. Lungs: clear Abdomen: soft, nontender, nondistended. No hepatosplenomegaly. No bruits or masses. Good bowel sounds. Extremities: no cyanosis, clubbing, rash, edema Neuro: alert & orientedx3, cranial nerves grossly intact. Weak on R  HF remains stable off of milrinone. Volume status improved. Now of lasix. Agree with adding Jardiance. On amio for PVCs/NSVT. Keep K > 4.0 Mg > 2.0.  Plan TEE tomorrow. Procedure discussed.  Hopefully can go to CIR soon after TEE.   Glori Bickers MD

## 2021-10-23 NOTE — Plan of Care (Signed)
°  Problem: Ischemic Stroke/TIA Tissue Perfusion: Goal: Complications of ischemic stroke/TIA will be minimized 10/23/2021 0422 by Naoma Diener, RN Outcome: Progressing 10/23/2021 0018 by Naoma Diener, RN Outcome: Progressing   Problem: Education: Goal: Knowledge of General Education information will improve Description: Including pain rating scale, medication(s)/side effects and non-pharmacologic comfort measures Outcome: Progressing   Problem: Activity: Goal: Risk for activity intolerance will decrease Outcome: Progressing   Problem: Nutrition: Goal: Adequate nutrition will be maintained Outcome: Progressing   Problem: Coping: Goal: Level of anxiety will decrease Outcome: Progressing   Problem: Pain Managment: Goal: General experience of comfort will improve Outcome: Progressing   Problem: Safety: Goal: Ability to remain free from injury will improve Outcome: Progressing

## 2021-10-23 NOTE — Progress Notes (Addendum)
Inpatient Rehab Admissions Coordinator:   New CIR consult received. CIR previously signed off due to the fact that Pt. Will not have necessary support at discharge (per his wife). As his insurance will not pay for SNF stay after AIR/IRF (either CIR or outside AIR/IRF). Discharge plan has not changed. CIR will sign off.   Megan Salon, MS, CCC-SLP Rehab Admissions Coordinator  [phone] (celll) [phone] (office)

## 2021-10-23 NOTE — TOC Progression Note (Addendum)
Transition of Care [facility]) - Progression Note    Patient Details  Name: Oscar Rosales MRN: [id number] Date of Birth: 1965-11-06  Transition of Care [facility]) CM/SW Contact  Hasani Diemer, LCSW Phone Number: 10/23/2021, 11:55 AM  Clinical Narrative:    HF CSW spoke with Mr. Kempker at bedside to discuss about the plan at time of discharge. Mr. Hausladen reported that he doesn't have a preference between [facility] or SNF and he is agreeable for the CSW to speak with his wife. HF CSW reached out to Mrs. Glasson [phone] and discussed about the options between [facility] and SNF and she reported that she hasn't yet  decided. The CSW encouraged Mrs. Corless to do her homework and look into the SNF's and read reviews online or visit if she has time to help with making a decision. HF CSW reached out to J. Hasty at [facility] and she plans to talk with Mrs. Paynter today around 1pm. 4:08pm - HF CSW spoke with Mrs. Leiman and let her know the timeline for SNF that the insurance will pay up to 20 days for SNF and Mrs. Sawada reported she would like to go with [facility]. 4:11pm - HF CSW reached out to J. Hasty who reported she will initiate the insurance authorization.  CSW will continue to follow throughout discharge.   Expected Discharge Plan: [place] Barriers to Discharge: Continued Medical Work up, SNF Pending bed offer, Ship broker  Expected Discharge Plan and Services Expected Discharge Plan: [place] In-house Referral: Clinical Social Work Discharge Planning Services: CM Consult Post Acute Care Choice: [place] Living arrangements for the past 2 months: Single Family Home                                       Social Determinants of Health (SDOH) Interventions Food Insecurity Interventions: Intervention Not Indicated Housing Interventions: Intervention Not Indicated  Readmission Risk Interventions No flowsheet data found.  Jemari Hallum,  MSW, LCSW [phone] Heart Failure Social Worker

## 2021-10-23 NOTE — Progress Notes (Signed)
Occupational Therapy Treatment Patient Details Name: Stepehen Bussie MRN: [id number] DOB: 06/10/1966 Today's Date: 10/23/2021   History of present illness Pt is a 56 y/o male admitted secondary to fatigue and syncopal episode at work. 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 Pt also found to have mild nonocclusive pulmonary emboli and started on Heparin drip. PMH including but not limited to HFrEF (EF 25-30% 02/2020), HLD, HTN, CAD, and CKD3b.   OT comments  Nand is making functional gains towards his acute goals. Overall he required min A for functional ambulation within the room given verbal cues for safety. He was set up for self feeding in the chair and encouraged use of RUE. He remains limited by poor dexterity and coordination of RUE. He may benefit from adaptive feeding utensils. Continue to recommend d/c to AIR, OT to follow acutely.    Recommendations for follow up therapy are one component of a multi-disciplinary discharge planning process, led by the attending physician.  Recommendations may be updated based on patient status, additional functional criteria and insurance authorization.    Follow Up Recommendations  Acute inpatient rehab (3hours/day)       Patient can return home with the following  A lot of help with walking and/or transfers;A lot of help with bathing/dressing/bathroom;Help with stairs or ramp for entrance;Assist for transportation;Assistance with feeding;Direct supervision/assist for medications management   Equipment Recommendations  Other (comment)    Recommendations for Other Services Rehab consult    Precautions / Restrictions Precautions Precautions: Fall;Other (comment) Precaution Comments: watch HR, O2 Restrictions Weight Bearing  Restrictions: No       Mobility Bed Mobility Overal bed mobility: Needs Assistance Bed Mobility: Supine to Sit Rolling: Supervision         General bed mobility comments: No physical assist required, cues for scooting forward to edge of bed to transition to standing    Transfers Overall transfer level: Needs assistance Equipment used: Rolling walker (2 wheels) Transfers: Sit to/from Stand Sit to Stand: Min assist           General transfer comment: assist for steady and cues     Balance Overall balance assessment: Needs assistance Sitting-balance support: Feet supported, No upper extremity supported Sitting balance-Leahy Scale: Fair     Standing balance support: Reliant on assistive device for balance, Bilateral upper extremity supported Standing balance-Leahy Scale: Poor                             ADL either performed or assessed with clinical judgement   ADL Overall ADL's : Needs assistance/impaired Eating/Feeding: Set up;Sitting Eating/Feeding Details (indicate cue type and reason): dys 3, thin. holding fork in R hand but using L hand to finger feed. May benefit from built up handle                                 Functional mobility during ADLs: Minimal assistance General ADL Comments: min A for short ambulation within the room. Cues for safety. set up to self feed. dropping fork and knocking things over with R    Extremity/Trunk Assessment Upper Extremity Assessment RUE Deficits / Details: limited over head ROM, uses functionally for grooming and oral hygiene.  slow and deliberate FM coordination. RUE Sensation: decreased light touch RUE Coordination: decreased fine motor;decreased gross motor   Lower Extremity Assessment Lower Extremity Assessment: Defer to PT evaluation        Vision   Vision Assessment?: Vision impaired- to be further tested in functional context Eye Alignment: Within Functional Limits Ocular Range of  Motion: Impaired-to be further tested in functional context Alignment/Gaze Preference: Within Defined Limits Tracking/Visual Pursuits: Impaired - to be further tested in functional context Saccades: Undershoots;Additional eye shifts occurred during testing;Additional head turns occurred during testing Visual Fields: Impaired-to be further tested in functional context   Perception Perception Perception: Not tested   Praxis Praxis Praxis: Not tested    Cognition Arousal/Alertness: Awake/alert Behavior During Therapy: Flat affect Overall Cognitive Status: Impaired/Different from baseline Area of Impairment: Attention, Memory, Following commands, Awareness, Problem solving                   Current Attention Level: Sustained Memory: Decreased short-term memory, Decreased recall of precautions Following Commands: Follows one step commands consistently, Follows one step commands with increased time, Follows multi-step commands inconsistently Safety/Judgement: Decreased awareness of safety, Decreased awareness of deficits Awareness: Emergent Problem Solving: Slow processing, Requires verbal cues General Comments: unable to recall events from this am (PT session)              General Comments VSS on RA    Pertinent Vitals/ Pain       Pain Assessment Pain Assessment: No/denies pain   Frequency  Min 2X/week        Progress Toward Goals  OT Goals(current goals can now be found in the care plan section)  Progress towards OT goals: Progressing toward goals  Acute Rehab OT Goals Patient Stated Goal: get stronger OT Goal Formulation: With patient Time For Goal Achievement: 11/01/21 Potential to Achieve Goals: Good ADL Goals Pt Will Perform Grooming: with modified independence;sitting Pt Will Perform Upper Body Dressing: with modified independence;sitting Pt Will Perform Lower Body Dressing: with modified independence;sit to/from stand Pt Will Transfer to Toilet:  with modified independence;ambulating  Plan Discharge plan remains appropriate    Co-evaluation                 AM-PAC OT "6 Clicks" Daily Activity     Outcome Measure   Help from another person eating meals?: A Little Help from another person taking care of personal grooming?: A Little Help from another person toileting, which includes using toliet, bedpan, or urinal?: A Lot Help from another person bathing (including washing, rinsing, drying)?: A Lot Help from another person to put on and taking off regular upper body clothing?: A Little Help from another person to put on and taking off regular lower body clothing?: A Lot 6 Click Score: 15    End of Session Equipment Utilized During Treatment: Gait belt;Rolling walker (2 wheels)  OT Visit Diagnosis: Unsteadiness on feet (R26.81);Other abnormalities of gait and mobility (R26.89);Muscle weakness (generalized) (M62.81);Hemiplegia and hemiparesis;Low vision, both eyes (H54.2) Hemiplegia - Right/Left: Right Hemiplegia - dominant/non-dominant: Dominant Hemiplegia - caused by: Cerebral infarction   Activity Tolerance Patient tolerated treatment well   Patient Left in chair;with call bell/phone within reach;with chair alarm set (eating)   Nurse Communication Mobility status (NT: pt in chair eating)        Time: 1700 (1700)-1718 OT Time Calculation (min): 18 min  Charges: OT General Charges $OT Visit: 1 Visit OT Treatments $Self Care/Home Management : 8-22 mins   Willow Reczek A  Izael Bessinger 10/23/2021, 5:34 PM

## 2021-10-23 NOTE — Progress Notes (Signed)
Physical Therapy Treatment Patient Details Name: Oscar Rosales MRN: [id number] DOB: [birth year] Today's Date: 10/23/2021   History of Present Illness Pt is a 56 y/o male admitted secondary to fatigue and syncopal episode at work. 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 Pt also found to have mild nonocclusive pulmonary emboli and started on Heparin drip. PMH including but not limited to HFrEF (EF 25-30% 02/2020), HLD, HTN, CAD, and CKD3b.    PT Comments    Pt progressing towards his physical therapy goals. Requiring min assist for transfers and ambulating limited hallway distances with mod assist and RW. Brief episode of desaturation to 86% on RA; pt able to recover with cues to stop for a rest break and take 5 deep breaths. Pt continues with cognitive impairments, poor standing balance, right sided weakness/decreased coordination, and visual deficits. Based on age, PLOF, and motivation, suspect continued steady progress. Recommend AIR to address deficits, maximize functional mobility and decrease caregiver burden.     Recommendations for follow up therapy are one component of a multi-disciplinary discharge planning process, led by the attending physician.  Recommendations may be updated based on patient status, additional functional criteria and insurance authorization.  Follow Up Recommendations  Acute inpatient rehab (3hours/day)     Assistance Recommended at Discharge Frequent or constant Supervision/Assistance  Patient can return home with the following A lot of help with walking and/or transfers;A lot of help with bathing/dressing/bathroom;Assistance with cooking/housework;Direct supervision/assist for medications management;Assist for transportation;Help with stairs or  ramp for entrance;Direct supervision/assist for financial management   Equipment Recommendations  Rolling walker (2 wheels);Wheelchair (measurements PT);Wheelchair cushion (measurements PT);BSC/3in1    Recommendations for Other Services       Precautions / Restrictions Precautions Precautions: Fall;Other (comment) Precaution Comments: watch HR, O2 Restrictions Weight Bearing Restrictions: No     Mobility  Bed Mobility Overal bed mobility: Needs Assistance Bed Mobility: Supine to Sit Rolling: Supervision         General bed mobility comments: No physical assist required, cues for scooting forward to edge of bed to transition to standing    Transfers Overall transfer level: Needs assistance Equipment used: Rolling walker (2 wheels) Transfers: Sit to/from Stand Sit to Stand: Min assist           General transfer comment: MinA to power up and steady, cues for hand placement and wider BOS    Ambulation/Gait Ambulation/Gait assistance: Mod assist Gait Distance (Feet): 100 Feet (100+50) Assistive device: Rolling walker (2 wheels) Gait Pattern/deviations: Step-through pattern, Decreased step length - right, Decreased dorsiflexion - right, Drifts right/left, Knee hyperextension - right, Narrow base of support Gait velocity: decreased     General Gait Details: Cues for obstacle/environmental negotiation, decreased R knee hyperextension, keeping walker on ground. Pt requiring modA for balance, lateral instability, and increased truncal sway. Assist for steering walking, particularly in smaller spaces   Stairs             Wheelchair Mobility    Modified Rankin (Stroke Patients Only) Modified Rankin (Stroke Patients Only) Pre-Morbid Rankin Score: No symptoms Modified Rankin: Moderately severe disability     Balance Overall balance assessment: Needs assistance Sitting-balance support: Feet supported, No upper extremity supported Sitting balance-Leahy Scale:  Fair     Standing balance support: Reliant on assistive device for balance, Bilateral upper  extremity supported Standing balance-Leahy Scale: Poor                              Cognition Arousal/Alertness: Awake/alert Behavior During Therapy: Flat affect Overall Cognitive Status: Impaired/Different from baseline Area of Impairment: Orientation, Attention, Memory, Following commands, Safety/judgement, Awareness                 Orientation Level: Disoriented to, Time, Situation Current Attention Level: Sustained Memory: Decreased short-term memory, Decreased recall of precautions Following Commands: Follows one step commands consistently, Follows one step commands with increased time, Follows multi-step commands inconsistently Safety/Judgement: Decreased awareness of safety, Decreased awareness of deficits Awareness: Emergent Problem Solving: Slow processing, Requires verbal cues General Comments: Pt able to state day of week, poor awareness of deficits and safety        Exercises General Exercises - Lower Extremity Long Arc Quad: Both, 10 reps, Seated Hip Flexion/Marching: Both, 10 reps, Seated    General Comments        Pertinent Vitals/Pain Pain Assessment Pain Assessment: No/denies pain    Home Living                          Prior Function            PT Goals (current goals can now be found in the care plan section) Acute Rehab PT Goals Patient Stated Goal: to improve Potential to Achieve Goals: Good Progress towards PT goals: Progressing toward goals    Frequency    Min 4X/week      PT Plan Current plan remains appropriate    Co-evaluation              AM-PAC PT "6 Clicks" Mobility   Outcome Measure  Help needed turning from your back to your side while in a flat bed without using bedrails?: A Little Help needed moving from lying on your back to sitting on the side of a flat bed without using bedrails?: A  Little Help needed moving to and from a bed to a chair (including a wheelchair)?: A Little Help needed standing up from a chair using your arms (e.g., wheelchair or bedside chair)?: A Little Help needed to walk in hospital room?: A Lot Help needed climbing 3-5 steps with a railing? : Total 6 Click Score: 15    End of Session Equipment Utilized During Treatment: Gait belt Activity Tolerance: Patient tolerated treatment well Patient left: in chair;with call bell/phone within reach;with chair alarm set Nurse Communication: Mobility status PT Visit Diagnosis: Other abnormalities of gait and mobility (R26.89);Unsteadiness on feet (R26.81);Muscle weakness (generalized) (M62.81);Difficulty in walking, not elsewhere classified (R26.2);Other symptoms and signs involving the nervous system [id number])     Time: [id number] PT Time Calculation (min) (ACUTE ONLY): 21 min  Charges:  $Gait Training: 8-22 mins                     Wyona Almas, PT, DPT Acute Rehabilitation Services Pager [phone] Office [phone]    Deno Etienne 10/23/2021, 9:38 AM

## 2021-10-24 ENCOUNTER — Encounter (HOSPITAL_COMMUNITY)
Admission: EM | Disposition: A | Discharge status: Rehabilitation Facility | Payer: Self-pay | Source: Home / Self Care | Attending: Internal Medicine

## 2021-10-24 ENCOUNTER — Inpatient Hospital Stay (HOSPITAL_COMMUNITY): Payer: BC Managed Care – PPO

## 2021-10-24 ENCOUNTER — Encounter (HOSPITAL_COMMUNITY): Payer: Self-pay | Admitting: Internal Medicine

## 2021-10-24 ENCOUNTER — Inpatient Hospital Stay (HOSPITAL_COMMUNITY): Payer: BC Managed Care – PPO | Admitting: Anesthesiology

## 2021-10-24 DIAGNOSIS — I639 Cerebral infarction, unspecified: Secondary | ICD-10-CM | POA: Diagnosis not present

## 2021-10-24 DIAGNOSIS — I351 Nonrheumatic aortic (valve) insufficiency: Secondary | ICD-10-CM

## 2021-10-24 DIAGNOSIS — I5023 Acute on chronic systolic (congestive) heart failure: Secondary | ICD-10-CM | POA: Diagnosis not present

## 2021-10-24 HISTORY — PX: TEE WITHOUT CARDIOVERSION: SHX5443

## 2021-10-24 HISTORY — PX: BUBBLE STUDY: SHX6837

## 2021-10-24 LAB — BASIC METABOLIC PANEL
Anion gap: 8 (ref 5–15)
BUN: 37 mg/dL — ABNORMAL HIGH (ref 6–20)
CO2: 24 mmol/L (ref 22–32)
Calcium: 8 mg/dL — ABNORMAL LOW (ref 8.9–10.3)
Chloride: 100 mmol/L (ref 98–111)
Creatinine, Ser: 2.18 mg/dL — ABNORMAL HIGH (ref 0.61–1.24)
GFR, Estimated: 35 mL/min — ABNORMAL LOW (ref >60)
Glucose, Bld: 92 mg/dL (ref 70–99)
Potassium: 4.8 mmol/L (ref 3.5–5.1)
Sodium: 132 mmol/L — ABNORMAL LOW (ref 135–145)

## 2021-10-24 LAB — COOXEMETRY PANEL
Carboxyhemoglobin: 0.8 % (ref 0.5–1.5)
Carboxyhemoglobin: 1 % (ref 0.5–1.5)
Methemoglobin: 0.8 % (ref 0.0–1.5)
Methemoglobin: 0.8 % (ref 0.0–1.5)
O2 Saturation: 79.6 %
O2 Saturation: 83.4 %
Total hemoglobin: 15.7 g/dL (ref 12.0–16.0)
Total hemoglobin: 15.5 g/dL (ref 12.0–16.0)

## 2021-10-24 SURGERY — ECHOCARDIOGRAM, TRANSESOPHAGEAL
Anesthesia: Monitor Anesthesia Care

## 2021-10-24 MED ORDER — PHENYLEPHRINE 40 MCG/ML (10ML) SYRINGE FOR IV PUSH (FOR BLOOD PRESSURE SUPPORT)
PREFILLED_SYRINGE | INTRAVENOUS | Status: DC | PRN
  Administered 2021-10-24 (×3): 40 ug via INTRAVENOUS

## 2021-10-24 MED ORDER — SODIUM CHLORIDE 0.9 % IV SOLN: INTRAVENOUS | Status: DC | PRN

## 2021-10-24 MED ORDER — PROPOFOL 10 MG/ML IV BOLUS
INTRAVENOUS | Status: DC | PRN
  Administered 2021-10-24: 30 mg via INTRAVENOUS
  Administered 2021-10-24 (×2): 10 mg via INTRAVENOUS

## 2021-10-24 MED ORDER — SODIUM CHLORIDE 0.9 % IV BOLUS
500.0000 mL | Freq: Once | INTRAVENOUS | Status: AC
  Administered 2021-10-24: 500 mL via INTRAVENOUS

## 2021-10-24 NOTE — Progress Notes (Signed)
Subjective: No acute overnight events.   Patient is seen at bedside during rounds this morning. Pt is awake and interactive. He is responsive to questions. He reports improvement in strength. His wife is at bedside. He appears happy this morning.   Stable mental status with no new acute focal deficits.  Objective:  Vital signs in last 24 hours: Vitals:   10/24/21 1249 10/24/21 1255 10/24/21 1302 10/24/21 1400  BP: 108/78 118/88 112/72 119/88  Pulse:  82 77 78  Resp: 11 (!) 26 18 17   Temp:      TempSrc:      SpO2:  100% 100% 98%  Weight:      Height:       Constitutional: sleepy but wakes up to voice  HENT: normocephalic, atraumatic, mucous membranes moist Cardiovascular: RRR, no m/r/g, no bilateral LE edema  Pulmonary/Chest: diminished breath sounds bilaterally.  Extremities: No twitching of the extremities noted   Neurological: A&O to person placed and situation, and possibly time.   Bilateral ptosis is present.  5/5 strength of right upper and lower extremity 5/5 strength of left upper and lower extremities  Assessment/Plan:  Principal Problem:   Acute CVA (cerebrovascular accident) ([place]) Active Problems:   Hypertension, uncontrolled   Non-ischemic cardiomyopathy ([place])   Stage 3 chronic kidney disease ([place])   Acute pulmonary embolism ([place])   Acute on chronic congestive heart failure ([place])   Malnutrition of moderate degree  Oscar Rosales is a 56 y.o. male with a pertinent PMH of nonischemic cardiomyopathy, HFrEF (EF 25-30% 02/2020), HLD, HTN, and CKD3b admitted for acute ischemic stroke 2/2 embolic infarctions, mild nonocclusive pulmonary emboli, and acute on chronic HFrEF exacerbation.  Multifocal ischemic brain infarcts Suspecting embolic stroke Twitching and lethargy, improved  Marked improvement in multifactorial encephalopathy related to respiratory and cardiac failure. TEE revealing no evidence of PFO or shunting. BP at goal with Bidil, Delene Loll, and Spiro, with  HR's in 80's.  - Continue Bidil tid  - Continue Entresto 24/26 bid  - Continue Spiro 12.5  - Continue Crestor 20 mg - Telemetry  - Frequent neuro checks  - PT/OT/SLP following - Cardiac monitoring at discharge  - Stable for d/c, pending [facility] IP approval/insurance authorization    Mild nonocclusive pulmonary emboli, source unclear  Hypercoagulably workup largely unremarkable. TEE negative for PFO or shunting. - Continue Eliquis 5mg  bid   AoC HFrEF exacerbation Nonischemic hypertensive cardiomyopathy Acute pulmonary edema in the s/o hypertensive crisis, resolved  Uncontrolled HTN Euvolemic on exam. Diuresed 6.3L with IV Lasix. BP at goal. TEE today revealing EF 10% with severe RV dysfunction.  - Advanced HF team following, appreciate assistance  - Continue Bidil  - Continue Entresto 24/26 bid  - Continue spiro 12.5     - Holding Jardiance in setting of AKI (see below)     - Amiodarone for PVCs - Daily weights and I/O's - Cardiac monitoring  - Holding BB in s/o acute on chronic HF   AKI on CKD3b Baseline Cr 1.6-1.8; Cr near 2 this admission during aggressive diuresis. Gave 1/2L IVF yesterday d/t bump to 2.18; Cr improved to 2 with fluids.  - Holding Jardiance  - Trend BMP - Avoid nephrotoxins    Stable ascending thoracic aortic dilatation  4.2 cm in size. - Annual imaging follow-up recommended by CTA or MRA    Best Practice: Diet: Dysphagia 3  IVF: None, None VTE: Eliquis Code: Full  [name], MD  Internal Medicine Resident, PGY-1 Pager: [phone] After 5pm  on weekdays and 1pm on weekends: On Call pager [phone]

## 2021-10-24 NOTE — Plan of Care (Signed)
°  Problem: Ischemic Stroke/TIA Tissue Perfusion: Goal: Complications of ischemic stroke/TIA will be minimized 10/24/2021 2141 by Trellis Moment, RN Outcome: Progressing 10/24/2021 1014 by Trellis Moment, RN Outcome: Progressing   Problem: Education: Goal: Knowledge of General Education information will improve Description: Including pain rating scale, medication(s)/side effects and non-pharmacologic comfort measures 10/24/2021 2141 by Trellis Moment, RN Outcome: Progressing 10/24/2021 1014 by Trellis Moment, RN Outcome: Progressing   Problem: Clinical Measurements: Goal: Ability to maintain clinical measurements within normal limits will improve 10/24/2021 2141 by Trellis Moment, RN Outcome: Progressing 10/24/2021 1014 by Trellis Moment, RN Outcome: Progressing Goal: Will remain free from infection 10/24/2021 2141 by Trellis Moment, RN Outcome: Progressing 10/24/2021 1014 by Trellis Moment, RN Outcome: Progressing Goal: Diagnostic test results will improve 10/24/2021 2141 by Trellis Moment, RN Outcome: Progressing 10/24/2021 1014 by Trellis Moment, RN Outcome: Progressing Goal: Respiratory complications will improve 10/24/2021 2141 by Trellis Moment, RN Outcome: Progressing 10/24/2021 1014 by Trellis Moment, RN Outcome: Progressing Goal: Cardiovascular complication will be avoided 10/24/2021 2141 by Trellis Moment, RN Outcome: Progressing 10/24/2021 1014 by Trellis Moment, RN Outcome: Progressing   Problem: Activity: Goal: Risk for activity intolerance will decrease 10/24/2021 2141 by Trellis Moment, RN Outcome: Progressing 10/24/2021 1014 by Trellis Moment, RN Outcome: Progressing   Problem: Nutrition: Goal: Adequate nutrition will be maintained 10/24/2021 2141 by Trellis Moment, RN Outcome: Progressing 10/24/2021 1014 by Trellis Moment, RN Outcome: Progressing   Problem: Coping: Goal: Level of anxiety will decrease 10/24/2021 2141 by Trellis Moment, RN Outcome: Progressing 10/24/2021 1014 by Trellis Moment, RN Outcome: Progressing    Problem: Pain Managment: Goal: General experience of comfort will improve 10/24/2021 2141 by Trellis Moment, RN Outcome: Progressing 10/24/2021 1014 by Trellis Moment, RN Outcome: Progressing   Problem: Safety: Goal: Ability to remain free from injury will improve 10/24/2021 2141 by Trellis Moment, RN Outcome: Progressing 10/24/2021 1014 by Trellis Moment, RN Outcome: Progressing   Problem: Skin Integrity: Goal: Risk for impaired skin integrity will decrease 10/24/2021 2141 by Trellis Moment, RN Outcome: Progressing 10/24/2021 1014 by Trellis Moment, RN Outcome: Progressing   Problem: Health Behavior/Discharge Planning: Goal: Ability to manage health-related needs will improve 10/24/2021 2141 by Trellis Moment, RN Outcome: Not Progressing 10/24/2021 1014 by Trellis Moment, RN Outcome: Progressing

## 2021-10-24 NOTE — Progress Notes (Signed)
Physical Therapy Treatment Patient Details Name: Oscar Rosales MRN: [id number] DOB: Jun 05, 1966 Today's Date: 10/24/2021   History of Present Illness Pt is a 56 y/o male admitted 10/11/21 secondary to fatigue and syncopal episode at work. 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 Pt also found to have mild nonocclusive pulmonary emboli and started on Heparin drip. PMH including but not limited to HFrEF (EF 25-30% 02/2020), HLD, HTN, CAD, and CKD3b.    PT Comments    Initiated session with challenging pt's compensatory strategies for his visual deficits. Pt was able to find all obstacles hidden around the room, but required extra time to find obstacles laterally and inferiorly to the R. Poor carryover noted with scanning his environment when trying to apply to safe mobility, still requiring continual cues to pause, scan, and re-adjust as needed. As pt fatigued, his balance worsened, requiring up to modA to prevent LOB. Will continue to follow acutely. Pt would greatly benefit from intensive therapy in the AIR setting due to the extent of his deficits impacting his safety and his motivation to improve.    Recommendations for follow up therapy are one component of a multi-disciplinary discharge planning process, led by the attending physician.  Recommendations may be updated based on patient status, additional functional criteria and insurance authorization.  Follow Up Recommendations  Acute inpatient rehab (3hours/day)     Assistance Recommended at Discharge Frequent or constant Supervision/Assistance  Patient can return home with the following A lot of help with walking and/or transfers;A lot of help with bathing/dressing/bathroom;Assistance with cooking/housework;Direct  supervision/assist for medications management;Assist for transportation;Help with stairs or ramp for entrance;Direct supervision/assist for financial management   Equipment Recommendations  Rolling walker (2 wheels);Wheelchair (measurements PT);Wheelchair cushion (measurements PT);BSC/3in1    Recommendations for Other Services       Precautions / Restrictions Precautions Precautions: Fall;Other (comment) Precaution Comments: watch HR, O2, visual field deficits Restrictions Weight Bearing Restrictions: No     Mobility  Bed Mobility Overal bed mobility: Needs Assistance Bed Mobility: Supine to Sit     Supine to sit: Supervision, HOB elevated     General bed mobility comments: No physical assist required, cues for scooting forward to edge of bed to transition to standing    Transfers Overall transfer level: Needs assistance Equipment used: Rolling walker (2 wheels) Transfers: Sit to/from Stand Sit to Stand: Min assist           General transfer comment: MinA to steady as pt's R foot began to slide anteriorly. Cues to look at R hand for proper placement on RW    Ambulation/Gait Ambulation/Gait assistance: Mod assist Gait Distance (Feet): 126 Feet Assistive device: Rolling walker (2 wheels) Gait Pattern/deviations: Step-through pattern, Decreased dorsiflexion - right, Drifts right/left, Knee hyperextension - right, Narrow base of support, Decreased step length - left, Decreased stance time - right, Decreased weight shift to right Gait velocity: decreased Gait velocity interpretation: <1.31 ft/sec, indicative of household ambulator   General Gait Details: Obstacles placed in hallways, primarily on R to try to get pt to carryover scanning environment as he did prior to ambulating. Pt with poor carryover, needing cues to pause and scan before ambulating, still bumping into obstacles often, needing cues to track vision down legs of RW to see where he was stuck and how to  fix  it. As pt fatigued, his lateral trunk sway increased, needing up to modA to prevent LOB. Tends to stagger to his L majority of time. Decreased R stance and L step length, needing tactile cues to correct momentarily.   Stairs             Wheelchair Mobility    Modified Rankin (Stroke Patients Only) Modified Rankin (Stroke Patients Only) Pre-Morbid Rankin Score: No symptoms Modified Rankin: Moderately severe disability     Balance Overall balance assessment: Needs assistance Sitting-balance support: Feet supported, No upper extremity supported Sitting balance-Leahy Scale: Fair     Standing balance support: Reliant on assistive device for balance, Bilateral upper extremity supported Standing balance-Leahy Scale: Poor Standing balance comment: requires external support in standing                            Cognition Arousal/Alertness: Awake/alert Behavior During Therapy: Flat affect Overall Cognitive Status: Impaired/Different from baseline Area of Impairment: Attention, Memory, Following commands, Safety/judgement, Awareness, Problem solving                   Current Attention Level: Sustained Memory: Decreased short-term memory, Decreased recall of precautions Following Commands: Follows one step commands consistently, Follows one step commands with increased time, Follows multi-step commands inconsistently Safety/Judgement: Decreased awareness of safety, Decreased awareness of deficits Awareness: Emergent Problem Solving: Slow processing, Requires verbal cues, Decreased initiation General Comments: Pt repeatedly forgetting to scan environment, needing cues to do so. Needs cues to problem solve how to adjust his position and RW to get unstuck from obstacles.        Exercises Other Exercises Other Exercises: Prior to ambulating, pt standing at EOB with hands on RW. Cued pt to keep feet static and rotate/move head to scan the room to look for various  objects in various visual fields. Increased time noted to find objects in R inferior lateral field, but able to do so.    General Comments General comments (skin integrity, edema, etc.): HR up to 151      Pertinent Vitals/Pain Pain Assessment Pain Assessment: No/denies pain    Home Living                          Prior Function            PT Goals (current goals can now be found in the care plan section) Acute Rehab PT Goals Patient Stated Goal: to improve PT Goal Formulation: With patient/family Time For Goal Achievement: 10/27/21 Potential to Achieve Goals: Good Progress towards PT goals: Progressing toward goals    Frequency    Min 4X/week      PT Plan Current plan remains appropriate    Co-evaluation              AM-PAC PT "6 Clicks" Mobility   Outcome Measure  Help needed turning from your back to your side while in a flat bed without using bedrails?: A Little Help needed moving from lying on your back to sitting on the side of a flat bed without using bedrails?: A Little Help needed moving to and from a bed to a chair (including a wheelchair)?: A Little Help needed standing up from a chair using your arms (e.g., wheelchair or bedside chair)?: A Little Help needed to walk in hospital room?: A Lot Help needed climbing 3-5 steps with a railing? : Total 6 Click  Score: 15    End of Session Equipment Utilized During Treatment: Gait belt Activity Tolerance: Patient tolerated treatment well Patient left: in chair;with call bell/phone within reach;with chair alarm set;with family/visitor present   PT Visit Diagnosis: Other abnormalities of gait and mobility (R26.89);Unsteadiness on feet (R26.81);Muscle weakness (generalized) (M62.81);Difficulty in walking, not elsewhere classified (R26.2);Other symptoms and signs involving the nervous system (R29.898)     Time: 9163-8466 PT Time Calculation (min) (ACUTE ONLY): 27 min  Charges:  $Gait Training:  8-22 mins $Therapeutic Activity: 8-22 mins                     Raymond Gurney, PT, DPT Acute Rehabilitation Services  Pager: [phone] Office: [phone]    Jewel Baize 10/24/2021, 5:00 PM

## 2021-10-24 NOTE — Progress Notes (Signed)
Nutrition Follow-up  DOCUMENTATION CODES:   Non-severe (moderate) malnutrition in context of acute illness/injury, Underweight  INTERVENTION:   - Liberalize diet to 2 gram sodium  - Continue Ensure Enlive po BID, each supplement provides 350 kcal and 20 grams of protein  - Continue MVI with minerals daily  - Continue snacks TID between meals  NUTRITION DIAGNOSIS:   Moderate Malnutrition related to chronic illness (CHF) as evidenced by moderate muscle depletion, mild fat depletion.  Ongoing, being addressed via supplements and snacks  GOAL:   Patient will meet greater than or equal to 90% of their needs  Progressing  MONITOR:   PO intake, Supplement acceptance, Weight trends  REASON FOR ASSESSMENT:   Consult Assessment of nutrition requirement/status  ASSESSMENT:   56 y.o. male with history of CHF (25-30%), HTN, HLD, CAD  and CKD3 presented to the ED with LOC and AMS after being found down in the bathroom by his coworkers. Imaging in ED showed multiple scattered infarcts.  02/09 - s/p TEE  Spoke with pt briefly at bedside. Pt sleeping at time of RD visit but did awaken to voice. Pt reports that he is eating well and his appetite is good. He states that he is drinking at least 1 Ensure daily. Pt accepting ~90% of Ensure supplements when offered per [facility]. Noted 1 empty Ensure container at bedside along with 2 empty applesauce containers. Pt noted to have consumed 90% of breakfast meal this morning.  Reached out to HF team regarding liberalizing diet from Heart Healthy back to 2 gram sodium to provide the most options given pt is severely malnourished. PA in agreement and orders placed.  Admit weight: 74.9 kg Current weight: 59.3 kg  Meal Completion: 2/10: 90% 2/09: 60%, 20% 2/08: 60%, 75% 2/07: 80%, 50%  Medications reviewed and include: Ensure Enlive BID, MVI with minerals daily, spironolactone  Labs reviewed: sodium 131, BUN 36, creatinine 1.96  UOP: 1850 ml  x 24 hours I/O's: -8.7 L since admit  Diet Order:   Diet Order             Diet 2 gram sodium Room service appropriate? Yes; Fluid consistency: Thin  Diet effective now                   EDUCATION NEEDS:   Not appropriate for education at this time  Skin:  Skin Assessment: Reviewed RN Assessment  Last BM:  10/22/21  Height:   Ht Readings from Last 1 Encounters:  10/24/21 5\' 11"  (1.803 m)    Weight:   Wt Readings from Last 1 Encounters:  10/25/21 59.3 kg    Ideal Body Weight:  78.2 kg  BMI:  Body mass index is 18.23 kg/m.  Estimated Nutritional Needs:   Kcal:  1900-2100 kcal  Protein:  95-105 g/d  Fluid:  1.8-2 L/d    12/23/21, MS, RD, LDN Inpatient Clinical Dietitian Please see [facility] for contact information.

## 2021-10-24 NOTE — Progress Notes (Addendum)
Patient ID: Oscar Rosales, male   DOB: 1965-10-31, 56 y.o.   MRN: [id number]     Advanced Heart Failure Rounding Note  PCP-Cardiologist: None   Subjective:    1/30: DBA started for low output, initial Co-ox 44%  2/1 DBA stopped and switched to milrinone due to hypertension 2/5: Milrinone decreased to 0.125 2/6: Milrinone stopped  Co-ox pending (earlier result was not accurate, notified by lab it was collected in wrong tube). RN just resent.   CVP low, 3  Scr trending back up 2.15>2>1.95>1.89 > 1.76>>1.90>2.18   I/Os not accurate (4 unmeasured voids)  No hypotension. SBPs 120s-130s   He reports his appetite has been "good".  Denies dyspnea. No CP. Awaiting TEE today.     Objective:   Weight Range: 58 kg Body mass index is 17.83 kg/m.   Vital Signs:   Temp:  [97.6 F (36.4 C)-98.1 F (36.7 C)] 97.6 F (36.4 C) (02/09 0727) Pulse Rate:  [78-103] 79 (02/09 0727) Resp:  [0-31] 18 (02/09 0727) BP: (97-149)/(74-118) 138/99 (02/09 0727) SpO2:  [93 %-99 %] 99 % (02/09 0727) Weight:  [58 kg] 58 kg (02/09 0500) Last BM Date: 10/22/21  Weight change: Filed Weights   10/22/21 0354 10/23/21 0439 10/24/21 0500  Weight: 59 kg 57.3 kg 58 kg    Intake/Output:   Intake/Output Summary (Last 24 hours) at 10/24/2021 0955 Last data filed at 10/24/2021 0500 Gross per 24 hour  Intake 329.56 ml  Output 750 ml  Net -420.44 ml      Physical Exam   CVP 3  General:  chronically ill appearing, looks much older than actual age. No respiratory difficulty HEENT: normal Neck: supple. no JVD. Carotids 2+ bilat; no bruits. No lymphadenopathy or thyromegaly appreciated. Cor: PMI nondisplaced. Regular rate & rhythm. No rubs, gallops or murmurs. Lungs: clear Abdomen: soft, nontender, nondistended. No hepatosplenomegaly. No bruits or masses. Good bowel sounds. Extremities: no cyanosis, clubbing, rash, edema Neuro: alert & oriented x 3, cranial nerves grossly intact. moves all 4 extremities  w/o difficulty. Affect pleasant.  Telemetry   NSr 80s. No further VT  (personally reviewed)  Labs    CBC Recent Labs    10/22/21 0409  WBC 9.5  HGB 15.7  HCT 45.1  MCV 91.5  PLT [id number]   Basic Metabolic Panel Recent Labs    10/23/21 0400 10/24/21 0448  NA 131* 132*  K 4.4 4.8  CL 99 100  CO2 25 24  GLUCOSE 103* 92  BUN 32* 37*  CREATININE 1.90* 2.18*  CALCIUM 8.0* 8.0*  MG 2.0  --    Liver Function Tests No results for input(s): AST, ALT, ALKPHOS, BILITOT, PROT, ALBUMIN in the last 72 hours. No results for input(s): LIPASE, AMYLASE in the last 72 hours. Cardiac Enzymes No results for input(s): CKTOTAL, CKMB, CKMBINDEX, TROPONINI in the last 72 hours.  BNP: BNP (last 3 results) Recent Labs    10/11/21 1245  BNP 3,942.1*    ProBNP (last 3 results) No results for input(s): PROBNP in the last 8760 hours.   D-Dimer No results for input(s): DDIMER in the last 72 hours. Hemoglobin A1C No results for input(s): HGBA1C in the last 72 hours. Fasting Lipid Panel No results for input(s): CHOL, HDL, LDLCALC, TRIG, CHOLHDL, LDLDIRECT in the last 72 hours. Thyroid Function Tests No results for input(s): TSH, T4TOTAL, T3FREE, THYROIDAB in the last 72 hours.  Invalid input(s): FREET3   Other results:   Imaging    No results found.  Medications:     Scheduled Medications:  amiodarone  200 mg Oral BID   apixaban  5 mg Oral BID   Chlorhexidine Gluconate Cloth  6 each Topical Daily   empagliflozin  10 mg Oral Daily   feeding supplement  237 mL Oral BID BM   isosorbide-hydrALAZINE  2 tablet Oral TID   multivitamin with minerals  1 tablet Oral Daily   rosuvastatin  20 mg Oral Daily   sacubitril-valsartan  1 tablet Oral BID   sodium chloride flush  10-40 mL Intracatheter Q12H   spironolactone  12.5 mg Oral Daily    Infusions:  sodium chloride 10 mL/hr (10/21/21 2153)    PRN Medications: acetaminophen **OR** acetaminophen (TYLENOL) oral liquid 160  mg/5 mL **OR** acetaminophen, guaiFENesin-dextromethorphan, senna-docusate, sodium chloride flush    Patient Profile   56 y.o. male with hx chronic systolic CHF/NICM, HTN, CKD IIIb, hx hepatitis C infection, noncompliance with medical therapy. Admitted with CVA, PE and acute on chronic systolic CHF w/ low output. Has been followed at [facility].  Echo with biventricular hypertrophy/dysfunction EF 15-20% Moderate MR moderate RV dysfunction.  Assessment/Plan   Acute on chronic systolic CHF/NICM: -Diagnosed 2018. Normal coronaries on LHC in 2019. Felt to be due to uncontrolled hypertension. -EF previously recovered to 45-50%.  -EF down to 25-30% in 06/21 in setting of uncontrolled HTN.  -Echo 01/23: EF 15-20%, RV moderately reduced, severe biatrial enlargement, moderate MR, moderate TR, moderate AI -Volume overloaded w/ low output, initial co-ox 44%. DBA later stopped and switched to milrinone 0.25 mcg.  - Milrinone stopped 2/6, Co-ox pending  - Low volume status, CVP 3, SCr rising  - Hold Jardiance for now. May need to give a little fluid back  - No beta blocker with a/c CHF - Continue Entresto 24/26 mg BID - Continue Spiro 12.5 mg daily  - Continue bidil 2 tabs three times a day. CO-Pay 47.00 may need to switch to hydralazine/imdur at d/c - no digoxin w/ CKD  -Currently not candidate for advanced therapies currently d/t noncompliance and renal impairment.   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 Scattered infarcts on MRI brain -No large vessel occlusion on CTA head and neck -Negative bubble study on echo - MRI- Scattered infarcts involving the right cerebellar hemisphere, left cerebral peduncle, left hippocampus, left thalamus, right occipital lobe, and left pre and postcentral gyri, some of which appear acute while others appear more subacute in chronicity. - On statin.  - Neuro signed off.  - PT/OT following. Needs SNF  - Plan TEE today   3. PE: -Incidentally noted on CTA chest -On apixaban.    -LE dopplers negative for DVT -Hypercoagulable labs pending   4. AKI on CKD IIIb: -Baseline Scr 1.7 -SCr peaked to 2.22 -->today 2.05-->2.03 -->2.15 -> 2.0 -> 1.95 -> 1.89> 1.76>1.90->2.18  -Renal US c/w medical renal disease. -suspect cardiorenal from low output HF - W/ low volume status/CVP, will hold Jardiance today.   - Follow BMP   5. PVCs /NSVT - Placed on amio drip 2/2 due to increased PVC burden.  - 14 beat run NSVT 2/7. No further recurrence  - Continue amiodarone 200 bid - if further NSVT, may need LifeVest  - Keep K >4.0 and Mg > 2.0   6. HTN - Improved  7. Deconditioning  - continue to work w/ PT - Needs SNF. SW assisting    Length of Stay: [address] [place]  10/24/2021, 9:55 AM  Advanced Heart Failure Team Pager [phone] (M-F;  7a - 5p)  Please contact [facility] for night-coverage after hours (5p -7a ) and weekends on [email or web address]  Patient seen and examined with the above-signed Advanced Practice Provider and/or Housestaff. I personally reviewed laboratory data, imaging studies and relevant notes. I independently examined the patient and formulated the important aspects of the plan. I have edited the note to reflect any of my changes or salient points. I have personally discussed the plan with the patient and/or family.  Feels ok. CVP low. SCr rising. Denies CP or SOB.   TEE today EF 10% severe RV dysfunction. No PFO/ Bubble study negative  General:  Weak appearing. No resp difficulty HEENT: normal Neck: supple. no JVD. Carotids 2+ bilat; no bruits. No lymphadenopathy or thryomegaly appreciated. Cor: PMI nondisplaced. Regular rate & rhythm. No rubs, gallops or murmurs. Lungs: clear Abdomen: soft, nontender, nondistended. No hepatosplenomegaly. No bruits or masses. Good bowel sounds. Extremities: no cyanosis, clubbing, rash, edema Neuro: alert & orientedx3, cranial nerves grossly intact. Weak on R  TEE with no evidence of PFO or shunting.    Looks dry. SCr up. Hold Jardiance. With poor PO intake will give 500cc NS back.   Glori Bickers, MD  12:59 PM

## 2021-10-24 NOTE — Transfer of Care (Signed)
Immediate Anesthesia Transfer of Care Note  Patient: Oscar Rosales  Procedure(s) Performed: TRANSESOPHAGEAL ECHOCARDIOGRAM (TEE) CARDIOVERSION BUBBLE STUDY  Patient Location: Endoscopy Unit  Anesthesia Type:MAC  Level of Consciousness: drowsy and patient cooperative  Airway & Oxygen Therapy: Patient Spontanous Breathing and Patient connected to nasal cannula oxygen  Post-op Assessment: Report given to RN and Post -op Vital signs reviewed and stable  Post vital signs: Reviewed and stable  Last Vitals:  Vitals Value Taken Time  BP 94/65   Temp    Pulse 76   Resp 18   SpO2 97 on 4L O2 via [place]     Last Pain:  Vitals:   10/24/21 1159  TempSrc: Temporal  PainSc: 0-No pain      Patients Stated Pain Goal: 2 (89/02/28 4069)  Complications: No notable events documented.

## 2021-10-24 NOTE — Progress Notes (Signed)
Back from endo by bed awake and alert. 

## 2021-10-24 NOTE — Progress Notes (Signed)
Mobility Specialist Criteria Algorithm Info.    10/24/21 1050  Mobility  Activity Ambulated with assistance in hallway  Range of Motion/Exercises Active;All extremities  Level of Assistance Minimal assist, patient does 75% or more (max cues)  Assistive Device Front wheel walker  Distance Ambulated (ft) 100 ft  Activity Response Tolerated well   Patient received in bed agreeable to participate. Was independent for bed mobility but needed extra time. Stood and ambulated in hallway requiring min A due to unsteadiness and LOBx3. Returned to room without complaint or incident. Was left in bed with all needs met, all bell in reach.   10/24/2021 12:22 PM  Oscar Rosales, [name], [name]  QPEAK:[phone] Office: [phone]

## 2021-10-24 NOTE — Anesthesia Preprocedure Evaluation (Addendum)
Anesthesia Evaluation  Patient identified by MRN, date of birth, ID band Patient awake    Reviewed: Allergy & Precautions, NPO status , Patient's Chart, lab work & pertinent test results  Airway Mallampati: III  TM Distance: >3 FB Neck ROM: Full    Dental  (+) Dental Advisory Given, Missing   Pulmonary neg pulmonary ROS,    Pulmonary exam normal breath sounds clear to auscultation       Cardiovascular hypertension, Pt. on home beta blockers and Pt. on medications + CAD and +CHF  Normal cardiovascular exam+ Valvular Problems/Murmurs MR  Rhythm:Regular Rate:Normal  Echo 09/2021 1. Left ventricular ejection fraction, by estimation, is 15-20%. The left ventricle has severely decreased function. The left ventricle demonstrates global hypokinesis. The left ventricular internal cavity size was  moderately dilated. There is mild left ventricular hypertrophy. Left ventricular diastolic parameters are  indeterminate.  2. Right ventricular systolic function is moderately reduced. The right ventricular size is mildly enlarged. Tricuspid regurgitation signal is inadequate for assessing PA pressure.  3. Left atrial size was severely dilated.  4. Right atrial size was severely dilated.  5. The mitral valve is abnormal. Moderate mitral valve regurgitation. No evidence of mitral stenosis.  6. The tricuspid valve is abnormal. Tricuspid valve regurgitation is moderate.  7. The aortic valve is tricuspid. Aortic valve regurgitation is moderate. No aortic stenosis is present.  8. Aortic dilatation noted. There is mild dilatation of the ascending aorta, measuring 41 mm.  9. The inferior vena cava is dilated in size with >50% respiratory variability, suggesting right atrial pressure of 8 mmHg.  10. Agitated saline contrast bubble study was negative, with no evidence of any interatrial shunt.    Neuro/Psych negative neurological ROS      GI/Hepatic negative GI ROS, Neg liver ROS,   Endo/Other  negative endocrine ROS  Renal/GU Renal disease     Musculoskeletal negative musculoskeletal ROS (+)   Abdominal   Peds  Hematology negative hematology ROS (+)   Anesthesia Other Findings   Reproductive/Obstetrics                           Anesthesia Physical Anesthesia Plan  ASA: 4  Anesthesia Plan: MAC   Post-op Pain Management: Minimal or no pain anticipated   Induction: Intravenous  PONV Risk Score and Plan: 1 and TIVA, Treatment may vary due to age or medical condition, Propofol infusion and Midazolam  Airway Management Planned: Natural Airway  Additional Equipment:   Intra-op Plan:   Post-operative Plan:   Informed Consent: I have reviewed the patients History and Physical, chart, labs and discussed the procedure including the risks, benefits and alternatives for the proposed anesthesia with the patient or authorized representative who has indicated his/her understanding and acceptance.     Dental advisory given  Plan Discussed with: CRNA  Anesthesia Plan Comments:       Anesthesia Quick Evaluation

## 2021-10-24 NOTE — Progress Notes (Signed)
°  Echocardiogram Echocardiogram Transesophageal has been performed.  Oscar Rosales 10/24/2021, 12:54 PM

## 2021-10-24 NOTE — Interval H&P Note (Signed)
History and Physical Interval Note:  10/24/2021 12:05 PM  Oscar Rosales  has presented today for surgery, with the diagnosis of CVA  The various methods of treatment have been discussed with the patient and family. After consideration of risks, benefits and other options for treatment, the patient has consented to  TRANSESOPHAGEAL ECHOCARDIOGRAM as a surgical intervention.  The patient's history has been reviewed, patient examined, no change in status, stable for surgery.  I have reviewed the patient's chart and labs.  Questions were answered to the patient's satisfaction.     Oscar Rosales

## 2021-10-24 NOTE — Progress Notes (Signed)
Attempted to call report. Patient's nurse at lunch, charge nurse in a pt.s room. Will attemt again. Pt. A&O x 4., on 5l/min o2, denies pain.

## 2021-10-24 NOTE — Anesthesia Procedure Notes (Signed)
Procedure Name: MAC Date/Time: 10/24/2021 12:20 PM Performed by: Lowella Dell, CRNA Pre-anesthesia Checklist: Patient identified, Emergency Drugs available, Suction available, Patient being monitored and Timeout performed Patient Re-evaluated:Patient Re-evaluated prior to induction Oxygen Delivery Method: Nasal cannula Placement Confirmation: positive ETCO2 Dental Injury: Teeth and Oropharynx as per pre-operative assessment

## 2021-10-24 NOTE — Progress Notes (Signed)
Placed a call to wife Carletta to get consent for TEE  [phone] but no answer. Left a message, still waiting for return call.. second call made  but no answer. 930 am follow up call made by the unit secretary, left message to daughter Boris Lown and wife. Still waiting for return call.

## 2021-10-24 NOTE — Progress Notes (Signed)
Transported to endo for TEE by bed awake and alert.

## 2021-10-24 NOTE — Plan of Care (Signed)
°  Problem: Ischemic Stroke/TIA Tissue Perfusion: Goal: Complications of ischemic stroke/TIA will be minimized Outcome: Progressing   Problem: Education: Goal: Knowledge of General Education information will improve Description: Including pain rating scale, medication(s)/side effects and non-pharmacologic comfort measures Outcome: Progressing   Problem: Health Behavior/Discharge Planning: Goal: Ability to manage health-related needs will improve Outcome: Progressing   Problem: Clinical Measurements: Goal: Ability to maintain clinical measurements within normal limits will improve Outcome: Progressing Goal: Will remain free from infection Outcome: Progressing Goal: Diagnostic test results will improve Outcome: Progressing Goal: Respiratory complications will improve Outcome: Progressing Goal: Cardiovascular complication will be avoided Outcome: Progressing   Problem: Activity: Goal: Risk for activity intolerance will decrease Outcome: Progressing   Problem: Nutrition: Goal: Adequate nutrition will be maintained Outcome: Progressing   Problem: Coping: Goal: Level of anxiety will decrease Outcome: Progressing   Problem: Pain Managment: Goal: General experience of comfort will improve Outcome: Progressing   Problem: Safety: Goal: Ability to remain free from injury will improve Outcome: Progressing   Problem: Skin Integrity: Goal: Risk for impaired skin integrity will decrease Outcome: Progressing

## 2021-10-24 NOTE — CV Procedure (Signed)
° ° °  TRANSESOPHAGEAL ECHOCARDIOGRAM   NAME:  Oscar Rosales   MRN: [id number] DOB:  Nov 13, 1965   ADMIT DATE: 10/11/2021  INDICATIONS:   PROCEDURE:   Informed consent was obtained prior to the procedure. The risks, benefits and alternatives for the procedure were discussed and the patient comprehended these risks.  Risks include, but are not limited to, cough, sore throat, vomiting, nausea, somnolence, esophageal and stomach trauma or perforation, bleeding, low blood pressure, aspiration, pneumonia, infection, trauma to the teeth and death.    After a procedural time-out, the patient was sedated by the anesthesia service.  The transesophageal probe was inserted in the esophagus and stomach without difficulty and multiple views were obtained.    COMPLICATIONS:    There were no immediate complications.  FINDINGS:  LEFT VENTRICLE: Markedly dilated EF = 10%.  RIGHT VENTRICLE: Severe global HK  LEFT ATRIUM: Markedly dilated. + smoke  LEFT ATRIAL APPENDAGE: No thrombus.   RIGHT ATRIUM: Moderately dilated  AORTIC VALVE:  Trileaflet. Mild central AI  MITRAL VALVE:    Normal. Trivial MR  TRICUSPID VALVE: Normal. Trivial TR  PULMONIC VALVE: Grossly normal.  INTERATRIAL SEPTUM: No PFO or ASD. Bubble study negative.   PERICARDIUM: No effusion  DESCENDING AORTA: Moderate plaque   CONCLUSION:   Truman Hayward 12:47 PM

## 2021-10-24 NOTE — Progress Notes (Addendum)
Speech Language Pathology Treatment: Cognitive-Linquistic  Patient Details Name: Oscar Rosales MRN: [id number] DOB: 08/12/1966 Today's Date: 10/24/2021 Time: 7408-1448 SLP Time Calculation (min) (ACUTE ONLY): 8 min  Assessment / Plan / Recommendation Clinical Impression    Pt required cues to use calendar to help him reorient to time, but he also had a difficult time reading the date and year from the calendar accurately. He knows he is at the hospital and vaguely why he is here. He does not have recall of hospital events or any plans for intervention, such as TEE scheduled for today. He needs heavy assistance for recall of anything he has done in therapy or to demonstrate intellectual awareness of physical and cognitive abilities. He keeps referencing his heart. Oscar Rosales sustained his attention for a few brief minutes at a time with cues from SLP to redirect attention. Session was short as staff arrived to take pt for his TEE. Continue to recommend AIR level therapy upon discharge.    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 Pt had also had vision changes starting the day before admission. MRI revealed multifocal ischemic infarcts involving the right cerebellar hemisphere, left cerebral peduncle, left hippocampus, left thalamus, right occipital lobe, and left pre and postcentral gyri. PMH includes: HFrEF (EF 25-30% 02/2020), HLD, HTN, CAD, and CKD3b      SLP Plan  Continue with current plan of care      Recommendations for follow up therapy are one component of a multi-disciplinary discharge planning process, led by the attending physician.  Recommendations may be updated based on patient status, additional functional criteria and insurance authorization.    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 CCC-SLP Acute Rehabilitation Services Pager [phone] Office [phone]  10/24/2021, 11:46 AM

## 2021-10-25 DIAGNOSIS — I639 Cerebral infarction, unspecified: Secondary | ICD-10-CM | POA: Diagnosis not present

## 2021-10-25 LAB — CBC
HCT: 44.5 % (ref 39.0–52.0)
Hemoglobin: 15.2 g/dL (ref 13.0–17.0)
MCH: 31.9 pg (ref 26.0–34.0)
MCHC: 34.2 g/dL (ref 30.0–36.0)
MCV: 93.3 fL (ref 80.0–100.0)
Platelets: 239 10*3/uL (ref 150–400)
RBC: 4.77 MIL/uL (ref 4.22–5.81)
RDW: 14.3 % (ref 11.5–15.5)
WBC: 9.9 10*3/uL (ref 4.0–10.5)
nRBC: 0 % (ref 0.0–0.2)

## 2021-10-25 LAB — BASIC METABOLIC PANEL WITH GFR
Anion gap: 8 (ref 5–15)
BUN: 36 mg/dL — ABNORMAL HIGH (ref 6–20)
CO2: 22 mmol/L (ref 22–32)
Calcium: 8.2 mg/dL — ABNORMAL LOW (ref 8.9–10.3)
Chloride: 101 mmol/L (ref 98–111)
Creatinine, Ser: 1.96 mg/dL — ABNORMAL HIGH (ref 0.61–1.24)
GFR, Estimated: 39 mL/min — ABNORMAL LOW
Glucose, Bld: 101 mg/dL — ABNORMAL HIGH (ref 70–99)
Potassium: 4.9 mmol/L (ref 3.5–5.1)
Sodium: 131 mmol/L — ABNORMAL LOW (ref 135–145)

## 2021-10-25 LAB — COOXEMETRY PANEL
Carboxyhemoglobin: 1 % (ref 0.5–1.5)
Methemoglobin: 1 % (ref 0.0–1.5)
O2 Saturation: 79.3 %
Total hemoglobin: 15.6 g/dL (ref 12.0–16.0)

## 2021-10-25 LAB — MAGNESIUM: Magnesium: 2.3 mg/dL (ref 1.7–2.4)

## 2021-10-25 MED ORDER — APIXABAN 5 MG PO TABS: 5.0000 mg | ORAL_TABLET | Freq: Two times a day (BID) | ORAL | 0 refills | Status: AC

## 2021-10-25 MED ORDER — ISOSORB DINITRATE-HYDRALAZINE 20-37.5 MG PO TABS: 2.0000 | ORAL_TABLET | Freq: Three times a day (TID) | ORAL | 0 refills | Status: AC

## 2021-10-25 MED ORDER — SACUBITRIL-VALSARTAN 24-26 MG PO TABS: 1.0000 | ORAL_TABLET | Freq: Two times a day (BID) | ORAL | 0 refills | Status: AC

## 2021-10-25 MED ORDER — SPIRONOLACTONE 25 MG PO TABS: 12.5000 mg | ORAL_TABLET | Freq: Every day | ORAL | 0 refills | Status: AC

## 2021-10-25 MED ORDER — GUAIFENESIN-DM 100-10 MG/5ML PO SYRP: 5.0000 mL | ORAL_SOLUTION | ORAL | 0 refills | Status: AC | PRN

## 2021-10-25 MED ORDER — SENNOSIDES-DOCUSATE SODIUM 8.6-50 MG PO TABS: 1.0000 | ORAL_TABLET | Freq: Every evening | ORAL | 0 refills | Status: AC | PRN

## 2021-10-25 MED ORDER — ENSURE ENLIVE PO LIQD: 237.0000 mL | Freq: Two times a day (BID) | ORAL | 12 refills | Status: AC

## 2021-10-25 MED ORDER — ROSUVASTATIN CALCIUM 20 MG PO TABS
20.0000 mg | ORAL_TABLET | Freq: Every day | ORAL | 0 refills | Status: AC
Start: 2021-10-26 — End: ?

## 2021-10-25 MED ORDER — AMIODARONE HCL 200 MG PO TABS: 200.0000 mg | ORAL_TABLET | Freq: Two times a day (BID) | ORAL | 0 refills | Status: AC

## 2021-10-25 NOTE — Anesthesia Postprocedure Evaluation (Signed)
Anesthesia Post Note  Patient: Oscar Rosales  Procedure(s) Performed: TRANSESOPHAGEAL ECHOCARDIOGRAM (TEE) CARDIOVERSION BUBBLE STUDY     Patient location during evaluation: PACU Anesthesia Type: MAC Level of consciousness: awake and alert Pain management: pain level controlled Vital Signs Assessment: post-procedure vital signs reviewed and stable Respiratory status: spontaneous breathing Cardiovascular status: stable Anesthetic complications: no   No notable events documented.  Last Vitals:  Vitals:   10/25/21 0730 10/25/21 1108  BP: 125/90 111/84  Pulse: 90 90  Resp: 15 20  Temp: 36.5 C 36.6 C  SpO2: 99% 98%    Last Pain:  Vitals:   10/25/21 1108  TempSrc: Oral  PainSc:                  Nolon Nations

## 2021-10-25 NOTE — Progress Notes (Signed)
Mobility Specialist Progress Note    10/25/21 1135  Mobility  Bed Position Chair  Activity Ambulated with assistance in hallway  Level of Assistance +2 (takes two people) (chair follow)  Assistive Device Front wheel walker  Distance Ambulated (ft) 100 ft  Activity Response Tolerated fair  $Mobility charge 1 Mobility   Pt received in bed and agreeable. Had LOB x2 and recovered with modA. Had no complaints. Wheeled back to room and left in chair with call bell in reach and alarm set.   [facility] Mobility Specialist  M.S. 5N: [phone]

## 2021-10-25 NOTE — Discharge Summary (Addendum)
Name: Oscar Rosales MRN: [id number] DOB: 1966-08-04 56 y.o. PCP: Judd Lien, PA-C  Date of Admission: 10/11/2021 12:37 PM Date of Discharge:  10/25/21 Attending Physician: Dr. Heide Spark  DISCHARGE DIAGNOSIS:  Primary Problem: Acute CVA (cerebrovascular accident) [facility])   Hospital Problems: Principal Problem:   Acute CVA (cerebrovascular accident) [facility]) Active Problems:   Hypertension, uncontrolled   Non-ischemic cardiomyopathy (HCC)   Stage 3 chronic kidney disease (HCC)   Acute pulmonary embolism (HCC)   Acute on chronic congestive heart failure (HCC)   Malnutrition of moderate degree    DISCHARGE MEDICATIONS:   Allergies as of 10/25/2021       Reactions   Lisinopril Cough   SWITCHED TO LOSARTAN        Medication List     STOP taking these medications    carvedilol 25 MG tablet Commonly known as: COREG   cloNIDine 0.3 mg/24hr patch Commonly known as: CATAPRES - Dosed in mg/24 hr   digoxin 0.125 MG tablet Commonly known as: LANOXIN   hydrALAZINE 100 MG tablet Commonly known as: APRESOLINE   isosorbide mononitrate 60 MG 24 hr tablet Commonly known as: IMDUR   losartan 25 MG tablet Commonly known as: COZAAR   magnesium oxide 400 MG tablet Commonly known as: MAG-OX   potassium chloride SA 20 MEQ tablet Commonly known as: KLOR-CON M   torsemide 20 MG tablet Commonly known as: DEMADEX   Vitamin D (Cholecalciferol) 25 MCG (1000 UT) Tabs       TAKE these medications    amiodarone 200 MG tablet Commonly known as: PACERONE Take 1 tablet (200 mg total) by mouth 2 (two) times daily.   apixaban 5 MG Tabs tablet Commonly known as: ELIQUIS Take 1 tablet (5 mg total) by mouth 2 (two) times daily.   ergocalciferol 1.25 MG (50000 UT) capsule Commonly known as: VITAMIN D2 Take 1 capsule by mouth every Tuesday.   feeding supplement Liqd Take 237 mLs by mouth 2 (two) times daily between meals.   ferrous sulfate 325 (65 FE) MG tablet Take 325 mg by  mouth every morning.   guaiFENesin-dextromethorphan 100-10 MG/5ML syrup Commonly known as: ROBITUSSIN DM Take 5 mLs by mouth every 4 (four) hours as needed for cough.   isosorbide-hydrALAZINE 20-37.5 MG tablet Commonly known as: BIDIL Take 2 tablets by mouth 3 (three) times daily.   multivitamin with minerals Tabs tablet Take 1 tablet by mouth daily.   rosuvastatin 20 MG tablet Commonly known as: CRESTOR Take 1 tablet (20 mg total) by mouth daily. Start taking on: October 26, 2021   sacubitril-valsartan 24-26 MG Commonly known as: ENTRESTO Take 1 tablet by mouth 2 (two) times daily.   senna-docusate 8.6-50 MG tablet Commonly known as: Senokot-S Take 1 tablet by mouth at bedtime as needed for mild constipation or moderate constipation.   spironolactone 25 MG tablet Commonly known as: ALDACTONE Take 0.5 tablets (12.5 mg total) by mouth daily. Start taking on: October 26, 2021               Durable Medical Equipment  (From admission, onward)           Start     Ordered   10/25/21 1147  DME Walker  Once       Question Answer Comment  Walker: With 5 Inch Wheels   Patient needs a walker to treat with the following condition Stroke (HCC)      10/25/21 1149   10/25/21 1147  DME 3-in-1  Once  10/25/21 1149   10/25/21 1147  DME Shower stool  Once        10/25/21 1149   10/25/21 1146  DME standard manual wheelchair with seat cushion  Once       Comments: Patient suffers from stroke which impairs their ability to perform daily activities like bathing, dressing, feeding, grooming, and toileting in the home.  A cane or walker will not resolve issue with performing activities of daily living. A wheelchair will allow patient to safely perform daily activities. Patient can safely propel the wheelchair in the home or has a caregiver who can provide assistance. Length of need 12 months . Accessories: elevating leg rests (ELRs), wheel locks, extensions and anti-tippers.    10/25/21 1149            DISPOSITION AND FOLLOW-UP:  Oscar Rosales was discharged from [facility] in stable condition. At the hospital follow up visit please address:  Follow-up Recommendations: Consults: cardiology  Labs: BMP  Studies: consider repeat hypercoagulable panel in 6-9 months  Medications:  Eliquis 5mg  bid, Spiro 25 qd, Bidil 20-37.5, Entresto 24-26 BID, Amiodorone 200 BID, Crestor 20 qd. Can consider starting Lasix depending on fluid status.   Follow-up Appointments:  Follow-up Information     Irving Copas H, PA-C. Schedule an appointment as soon as possible for a visit in 1 week(s).   Specialty: Physician Assistant Why: follow up with your PCP in 1 week for a post hospitalization visit. Contact information: [address] [place] [address] [phone]                 HOSPITAL COURSE:  Patient Summary: Multifocal ischemic infarcts  Suspecting embolic in nature given multiple scattered infarcts Multiple scattered acute to subacute infarcts involving the right cerebellar hemisphere, left cerebral peduncle, left hippocampus, left thalamus, right occipital lobe, and left pre and postcentral gyri suggesting embolic etiology, in the setting of pulmonary emboli and no large vessel disease. Increased suspicion for paradoxical emboli/shunt. Deficits include right upper extremity weakness, right facial droop, and word finding difficulty. Has sluggish pupillary reflexes with constricted left pupil. Largely unchanged exam over next several days. Allowed for permissive hypertension.  Workup includes negative echo, DVT US, no afib on telemetry, negative TCD bubble study.  Stroke team re-consulted 10/17/21 d/t concerns for concerns of change in mental status, twitching, and changes in breathing pattern. Concern for hemorrhagic conversion vs seizure given new twitching and lethargy with re-imaging largely reassuring, showing evolution of  prior infarcts but no large territory infarct or hemorrhagic conversion of prior infarcts. EEG negative, and thyroid studies not impressive. Suspect multifactorial encephalopathy related to respiratory and cardiac failure. Optimized BP control by up-titrating Hydral and then starting Bidil, and then adding Cleda Daub.   After clinical stability, TEE performed. TEE revealing no evidence of PFO or shunting. His EF on TEE with 10%, and suspect this may have been etiology. Pt was set up for 30 day cardiac monitoring, prior to being discharged to [facility] inpatient rehab.   Mild nonocclusive pulmonary emboli  Unclear source of emboli.  No known risk factor. Venous duplex negative for DVT and trans thoracic echocardiogram with negative bubble study and no evidence of clot. Hypercoagulable study not impressive. Patient also underwent transcranial ultrasound bubble study which was negative.  TEE negative (see above). - Continued on heparin infusion until 2/4, and then started on Eliquis  May need repeat hypercoagulable panel at 6 to 9 months if anticoagulation is discontinued.  AoC HFrEF exacerbation Nonischemic hypertensive cardiomyopathy Acute pulmonary edema in the setting of a hypertensive crisis Hx of uncontrolled HTN Patient has nonischemic cardiomyopathy with severely reduced EF, thought was due to hypertension. Autoimmune and amyloid test was negative. Last EF 25-30% in 2021. He was started on IV diuresis that was eventually uptitrated Lasix to 80 mg bid with minimal response, and given worsening AKI, cardiology consulted for assistance. Patient was thought to be in low output state and was started on dobutamine but then changed to milrinone given HTN. HTN resulting in acute pulm edema requring up-titration of lasix. Continued to diurese slowly, and stopped IV lasix once euvolemic. Weaned off of milrinone as well. Started [facility], but then held in setting of AKI. TEE revealing EF 10% with severe RV  dysfunction Currently not candidate for advanced therapies given comorbidities Can consider starting Lasix depending on fluid status.   CKD3b Cr 1.88 on admission (baseline of 1.6-1.8). Initially uptrending, and thought to be cardiorenal in combination with prerenal. Renal US negative for hydronephrosis. Improved with start of inotropic therapy, stayed near 2; this may be his new baseline after aggressive diuresis, though did mildly improve on last day of admission with 1/2L fluids.    HTN Uncontrolled HTN in the setting of medication non-compliance. Regimen includes Coreg, hydralazine, and losartan. Patient's blood pressure medications were initially held for permissive hypertension. Goal BP [id number] systolic, but because of continued elevated, hydralazine uptitrated and then added Bidil, and then added Delene Loll and Spiro by Cardiology for better control. BP at goal with Bidil, Delene Loll, and Arlyce Harman.   Macrocytosis Stable hgb 14.1, with MCV 100.2.  B12 and folate normal   Stable ascending thoracic aortic dilatation  4.2 cm in size.  - Annual imaging follow-up recommended by CTA or MRA     DISCHARGE INSTRUCTIONS:   Discharge Instructions     (HEART FAILURE PATIENTS) Call MD:  Anytime you have any of the following symptoms: 1) 3 pound weight gain in 24 hours or 5 pounds in 1 week 2) shortness of breath, with or without a dry hacking cough 3) swelling in the hands, feet or stomach 4) if you have to sleep on extra pillows at night in order to breathe.   Complete by: As directed    Call MD for:  difficulty breathing, headache or visual disturbances   Complete by: As directed    Call MD for:  extreme fatigue   Complete by: As directed    Call MD for:  hives   Complete by: As directed    Call MD for:  persistant dizziness or light-headedness   Complete by: As directed    Call MD for:  persistant nausea and vomiting   Complete by: As directed    Call MD for:  redness, tenderness, or signs of  infection (pain, swelling, redness, odor or green/yellow discharge around incision site)   Complete by: As directed    Call MD for:  severe uncontrolled pain   Complete by: As directed    Call MD for:  temperature >100.4   Complete by: As directed    Diet - low sodium heart healthy   Complete by: As directed    Increase activity slowly   Complete by: As directed        SUBJECTIVE:  No acute overnight events.    Patient is seen at bedside during rounds this morning. Pt is awake and interactive. He is responsive to questions. He reports improvement in strength. His wife is  at bedside. He appears happy this morning.    Stable mental status with no new acute focal deficits.   Discharge Vitals:   BP 111/84 (BP Location: Left Arm)    Pulse 90    Temp 97.8 F (36.6 C) (Oral)    Resp 20    Ht 5\' 11"  (1.803 m)    Wt 59.3 kg    SpO2 98%    BMI 18.23 kg/m   OBJECTIVE:  Constitutional: sleepy but wakes up to voice  HENT: normocephalic, atraumatic, mucous membranes moist Cardiovascular: RRR, no m/r/g, no bilateral LE edema  Pulmonary/Chest: diminished breath sounds bilaterally.  Extremities: No twitching of the extremities noted   Neurological: A&O to person placed and situation, and possibly time.   Bilateral ptosis is present.  5/5 strength of right upper and lower extremity 5/5 strength of left upper and lower extremities  Pertinent Labs, Studies, and Procedures:  CBC Latest Ref Rng & Units 10/25/2021 10/22/2021 10/21/2021  WBC 4.0 - 10.5 K/uL 9.9 9.5 7.1  Hemoglobin 13.0 - 17.0 g/dL 15.2 15.7 13.2  Hematocrit 39.0 - 52.0 % 44.5 45.1 38.5(L)  Platelets 150 - 400 K/uL 239 214 166    CMP Latest Ref Rng & Units 10/25/2021 10/24/2021 10/23/2021  Glucose 70 - 99 mg/dL 101(H) 92 103(H)  BUN 6 - 20 mg/dL 36(H) 37(H) 32(H)  Creatinine 0.61 - 1.24 mg/dL 1.96(H) 2.18(H) 1.90(H)  Sodium 135 - 145 mmol/L 131(L) 132(L) 131(L)  Potassium 3.5 - 5.1 mmol/L 4.9 4.8 4.4  Chloride 98 - 111 mmol/L 101 100  99  CO2 22 - 32 mmol/L 22 24 25   Calcium 8.9 - 10.3 mg/dL 8.2(L) 8.0(L) 8.0(L)  Total Protein 6.5 - 8.1 g/dL - - -  Total Bilirubin 0.3 - 1.2 mg/dL - - -  Alkaline Phos 38 - 126 U/L - - -  AST 15 - 41 U/L - - -  ALT 0 - 44 U/L - - -    CT ANGIO HEAD NECK W WO CM  Result Date: 10/11/2021 CLINICAL DATA:  Altered mental status EXAM: CT ANGIOGRAPHY HEAD AND NECK TECHNIQUE: Multidetector CT imaging of the head and neck was performed using the standard protocol during bolus administration of intravenous contrast. Multiplanar CT image reconstructions and MIPs were obtained to evaluate the vascular anatomy. Carotid stenosis measurements (when applicable) are obtained utilizing NASCET criteria, using the distal internal carotid diameter as the denominator. RADIATION DOSE REDUCTION: This exam was performed according to the departmental dose-optimization program which includes automated exposure control, adjustment of the mA and/or kV according to patient size and/or use of iterative reconstruction technique. CONTRAST:  130mL OMNIPAQUE IOHEXOL 350 MG/ML SOLN COMPARISON:  Head CT earlier same day FINDINGS: CT HEAD Brain: There is no acute intracranial hemorrhage, mass effect, or edema. No new loss of gray-white differentiation. Stable findings of probable chronic microvascular ischemic changes in the cerebral white matter. There is no extra-axial fluid collection. Ventricles and sulci are stable in size and configuration. Vascular: There are evaluated on CTA portion. Skull: Calvarium is unremarkable. Sinuses/Orbits: No acute finding. Other: None. Review of the MIP images confirms the above findings CTA NECK Aortic arch: Dilatation of the included ascending thoracic aorta measuring 4.2 cm. This is unchanged from prior chest CT. Great vessel origins are patent. Direct origin of the left vertebral from the arch. Right carotid system: Patent. No stenosis or evidence of dissection. Left carotid system: Patent.  No  stenosis or evidence of dissection. Vertebral arteries: Patent. Right vertebral is  dominant. No stenosis or evidence of dissection. Skeleton: Cervical spine degenerative changes better evaluated on same day dedicated imaging. Other neck: Unremarkable. Upper chest: Interlobular septal thickening. Chest is dictated separately. Review of the MIP images confirms the above findings CTA HEAD Anterior circulation: Intracranial internal carotid arteries are patent with mild atherosclerotic irregularity and plaque but no significant stenosis. Anterior and middle cerebral arteries are patent. Posterior circulation: Intracranial vertebral arteries, basilar artery, and posterior cerebral arteries are patent. Venous sinuses: Patent as allowed by contrast bolus timing. Review of the MIP images confirms the above findings IMPRESSION: No acute intracranial abnormality. No large vessel occlusion, hemodynamically significant stenosis, or evidence of dissection. 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 Annual imaging follow-up is recommended by CTA or MRA. Electronically Signed   By: Macy Mis M.D.   On: 10/11/2021 15:30   CT HEAD WO CONTRAST  Result Date: 10/11/2021 CLINICAL DATA:  Trauma. EXAM: CT HEAD WITHOUT CONTRAST CT MAXILLOFACIAL WITHOUT CONTRAST CT CERVICAL SPINE WITHOUT CONTRAST TECHNIQUE: Multidetector CT imaging of the head, cervical spine, and maxillofacial structures were performed using the standard protocol without intravenous contrast. Multiplanar CT image reconstructions of the cervical spine and maxillofacial structures were also generated. RADIATION DOSE REDUCTION: This exam was performed according to the departmental dose-optimization program which includes automated exposure control, adjustment of the mA and/or kV according to patient size and/or use of iterative reconstruction technique. COMPARISON:  March 14, 2021. FINDINGS: CT HEAD FINDINGS Brain: Mild chronic  ischemic white matter disease is noted. No mass effect or midline shift is noted. Ventricular size is within normal limits. There is no evidence of mass lesion, hemorrhage or acute infarction. Vascular: No hyperdense vessel or unexpected calcification. Skull: Normal. Negative for fracture or focal lesion. Other: None. CT MAXILLOFACIAL FINDINGS Osseous: No fracture or mandibular dislocation. No destructive process. Orbits: Negative. No traumatic or inflammatory finding. Sinuses: Clear. Soft tissues: Negative. CT CERVICAL SPINE FINDINGS Alignment: Grade 1 retrolisthesis of C3-4 and C4-5 is noted secondary to moderate degenerative disc disease at these levels. Skull base and vertebrae: No acute fracture. No primary bone lesion or focal pathologic process. Soft tissues and spinal canal: No prevertebral fluid or swelling. No visible canal hematoma. Disc levels: Moderate degenerative disc disease is noted at C3-4, C4-5 and C6-7. Upper chest: Negative. Other: None. IMPRESSION: No acute intracranial abnormality seen. No definite abnormality seen in maxillofacial region. Moderate multilevel degenerative disc disease is noted in the cervical spine. No fracture or other acute abnormality is noted in the cervical spine. Electronically Signed   By: Marijo Conception M.D.   On: 10/11/2021 13:21   CT Angio Chest PE W and/or Wo Contrast  Result Date: 10/11/2021 CLINICAL DATA:  Positive D-dimer. Pulmonary embolism suspected. Altered mental status. EXAM: CT ANGIOGRAPHY CHEST WITH CONTRAST TECHNIQUE: Multidetector CT imaging of the chest was performed using the standard protocol during bolus administration of intravenous contrast. Multiplanar CT image reconstructions and MIPs were obtained to evaluate the vascular anatomy. RADIATION DOSE REDUCTION: This exam was performed according to the departmental dose-optimization program which includes automated exposure control, adjustment of the mA and/or kV according to patient size and/or  use of iterative reconstruction technique. CONTRAST:  141mL OMNIPAQUE IOHEXOL 350 MG/ML SOLN COMPARISON:  AP chest 10/11/2021 03/14/2021, CT chest 12/28/2020 CTA chest 08/26/2019 FINDINGS: Cardiovascular: The main pulmonary artery is opacified up to 447 Hounsfield units. There are small linear filling defects suspicious for pulmonary emboli within the peripheral anterior aspect of the distal  right middle lobe pulmonary artery (axial series 5, image 81) and the medial segment of the right lower lobe pulmonary artery (axial series 5, image 88). The ascending aorta measures up to 4.2 cm in caliber, mildly aneurysmal. This is unchanged from prior. There is again a conjoined origin of the right brachiocephalic and left common carotid artery off the aortic arch, a normal variant. Additional normal-variant direct takeoff of the left vertebral artery from the aortic arch is again seen. Heart size is markedly enlarged measuring up to approximately 18 cm in transverse dimension. This may be slightly increased from 16 cm on 08/26/2019. No signs of right-sided heart strain. Mediastinum/Nodes: No axillary, mediastinal, or hilar pathologically enlarged lymph nodes by CT criteria. The esophagus follows normal course of normal caliber. Lungs/Pleura: The central airways are patent. Mild bilateral upper lung centrilobular emphysematous changes. There are scattered bilateral ground-glass opacities, greatest within the lower lungs. These are mildly increased compared to 08/26/2019. Small right pleural effusion and tiny left pleural effusion, new from prior. Chronic interlobular septal thickening within the right-greater-than-left lung bases, unchanged. Upper Abdomen: Limited arterial phase images of the upper abdomen again demonstrate a partially visualized fluid density likely cyst overlying the left kidney measuring up to 5.0 cm. Musculoskeletal: Mild-to-moderate multilevel degenerative disc changes of the thoracolumbar spine. There  are flowing bridging osteophytes again compatible with DISH. Review of the MIP images confirms the above findings. IMPRESSION:: IMPRESSION: 1. Mild nonocclusive pulmonary emboli within the right middle lobe pulmonary artery and right lower lobe medial segment pulmonary artery. 2. Stable ascending aortic aneurysm measuring up to 4.2 cm. 3. Marked cardiomegaly, possibly mildly worsened from 08/26/2019. 4. Small right and trace left pleural effusions. Scattered ground-glass opacities compatible with pulmonary edema, subsegmental atelectasis, and/or a mild infectious/inflammatory process. Critical Value/emergent results IMPRESSION #1 was called by telephone at the time of interpretation on 10/11/2021 at 3:37 pm to provider Dr. Lajean Saver, who verbally acknowledged these results. Electronically Signed   By: Yvonne Kendall M.D.   On: 10/11/2021 15:44   CT CERVICAL SPINE WO CONTRAST  Result Date: 10/11/2021 CLINICAL DATA:  Trauma. EXAM: CT HEAD WITHOUT CONTRAST CT MAXILLOFACIAL WITHOUT CONTRAST CT CERVICAL SPINE WITHOUT CONTRAST TECHNIQUE: Multidetector CT imaging of the head, cervical spine, and maxillofacial structures were performed using the standard protocol without intravenous contrast. Multiplanar CT image reconstructions of the cervical spine and maxillofacial structures were also generated. RADIATION DOSE REDUCTION: This exam was performed according to the departmental dose-optimization program which includes automated exposure control, adjustment of the mA and/or kV according to patient size and/or use of iterative reconstruction technique. COMPARISON:  March 14, 2021. FINDINGS: CT HEAD FINDINGS Brain: Mild chronic ischemic white matter disease is noted. No mass effect or midline shift is noted. Ventricular size is within normal limits. There is no evidence of mass lesion, hemorrhage or acute infarction. Vascular: No hyperdense vessel or unexpected calcification. Skull: Normal. Negative for fracture or focal  lesion. Other: None. CT MAXILLOFACIAL FINDINGS Osseous: No fracture or mandibular dislocation. No destructive process. Orbits: Negative. No traumatic or inflammatory finding. Sinuses: Clear. Soft tissues: Negative. CT CERVICAL SPINE FINDINGS Alignment: Grade 1 retrolisthesis of C3-4 and C4-5 is noted secondary to moderate degenerative disc disease at these levels. Skull base and vertebrae: No acute fracture. No primary bone lesion or focal pathologic process. Soft tissues and spinal canal: No prevertebral fluid or swelling. No visible canal hematoma. Disc levels: Moderate degenerative disc disease is noted at C3-4, C4-5 and C6-7. Upper chest: Negative. Other: None.  IMPRESSION: No acute intracranial abnormality seen. No definite abnormality seen in maxillofacial region. Moderate multilevel degenerative disc disease is noted in the cervical spine. No fracture or other acute abnormality is noted in the cervical spine. Electronically Signed   By: Marijo Conception M.D.   On: 10/11/2021 13:21   MR BRAIN WO CONTRAST  Result Date: 10/11/2021 CLINICAL DATA:  Delirium EXAM: MRI HEAD WITHOUT CONTRAST TECHNIQUE: Multiplanar, multiecho pulse sequences of the brain and surrounding structures were obtained without intravenous contrast. COMPARISON:  Same-day CT/CTA head and neck FINDINGS: Brain: There is patchy faint diffusion restriction in the right cerebellar hemisphere with faint T2/FLAIR signal abnormality consistent with acute subacute to infarct. Radiograph there is faint diffusion restriction in the left cerebral peduncle, left hippocampus, and medial right occipital lobe favored to reflect subacute infarcts. There is no associated hemorrhage or mass effect. Small more acute appearing infarcts are seen in the left pre and postcentral gyri. There is no associated hemorrhage or mass effect. Background parenchymal volume is normal. The ventricles are stable in size. Patchy and confluent FLAIR signal abnormality throughout  the remainder of the subcortical and periventricular white matter is nonspecific but likely reflects sequela of moderate chronic white matter microangiopathy. Remote lacunar infarcts are seen in the left cerebellar hemisphere. There is no mass lesion.  There is no midline shift Vascular: The major flow voids are present. The vasculature is assessed in full on the same day CTA head and neck. Skull and upper cervical spine: Normal marrow signal. Sinuses/Orbits: There is mild mucosal thickening in the paranasal sinuses. Globes and orbits are unremarkable. Other: None. IMPRESSION: 1. Scattered infarcts involving the right cerebellar hemisphere, left cerebral peduncle, left hippocampus, left thalamus, right occipital lobe, and left pre and postcentral gyri, some of which appear acute while others appear more subacute in chronicity. Consider embolic etiology given involvement of multiple vascular territories. 2. Background moderate chronic white matter microangiopathy. Electronically Signed   By: Valetta Mole M.D.   On: 10/11/2021 17:42   DG Pelvis Portable  Result Date: 10/11/2021 CLINICAL DATA:  Found down EXAM: PORTABLE PELVIS 1-2 VIEWS COMPARISON:  None. FINDINGS: No recent fracture or dislocation is seen. Degenerative changes are noted in the visualized lower lumbar spine. IMPRESSION: No displaced fracture or dislocation is seen in the pelvis. Electronically Signed   By: Elmer Picker M.D.   On: 10/11/2021 13:47   DG Chest Port 1 View  Result Date: 10/11/2021 CLINICAL DATA:  Found down, difficulty breathing EXAM: PORTABLE CHEST 1 VIEW COMPARISON:  03/14/2021 FINDINGS: Transverse diameter of heart is increased. Central pulmonary vessels are more prominent. Increased interstitial and alveolar markings are seen in the parahilar regions and lower lung fields on both sides. Lateral CP angles are clear. There is no pneumothorax. IMPRESSION: Cardiomegaly. There is pulmonary vascular congestion and pulmonary  edema in both lungs. Possibility of underlying pneumonia is not excluded. Electronically Signed   By: Elmer Picker M.D.   On: 10/11/2021 13:46   ECHOCARDIOGRAM COMPLETE BUBBLE STUDY  Result Date: 10/12/2021    ECHOCARDIOGRAM REPORT   Patient Name:   KALEV FALKE Date of Exam: 10/12/2021 Medical Rec #:  [id number]   Height:       71.0 in Accession #:    [id number]  Weight:       170.0 lb Date of Birth:  1965/10/12  BSA:          1.968 m Patient Age:    15 years    BP:  159/127 mmHg Patient Gender: M           HR:           94 bpm. Exam Location:  Inpatient Procedure: 2D Echo, Cardiac Doppler, Color Doppler, Saline Contrast Bubble Study            and Intracardiac Opacification Agent Indications:    Stroke  History:        Patient has no prior history of Echocardiogram examinations.                 Arrythmias:Atrial Fibrillation, Signs/Symptoms:Shortness of                 Breath; Risk Factors:Hypertension.  Sonographer:    Merrie Roof RDCS Referring Phys: [place]  1. Left ventricular ejection fraction, by estimation, is 15-20%. The left ventricle has severely decreased function. The left ventricle demonstrates global hypokinesis. The left ventricular internal cavity size was moderately dilated. There is mild left  ventricular hypertrophy. Left ventricular diastolic parameters are indeterminate.  2. Right ventricular systolic function is moderately reduced. The right ventricular size is mildly enlarged. Tricuspid regurgitation signal is inadequate for assessing PA pressure.  3. Left atrial size was severely dilated.  4. Right atrial size was severely dilated.  5. The mitral valve is abnormal. Moderate mitral valve regurgitation. No evidence of mitral stenosis.  6. The tricuspid valve is abnormal. Tricuspid valve regurgitation is moderate.  7. The aortic valve is tricuspid. Aortic valve regurgitation is moderate. No aortic stenosis is present.  8. Aortic dilatation noted. There  is mild dilatation of the ascending aorta, measuring 41 mm.  9. The inferior vena cava is dilated in size with >50% respiratory variability, suggesting right atrial pressure of 8 mmHg. 10. Agitated saline contrast bubble study was negative, with no evidence of any interatrial shunt. FINDINGS  Left Ventricle: Left ventricular ejection fraction, by estimation, is 15-20%. The left ventricle has severely decreased function. The left ventricle demonstrates global hypokinesis. Definity contrast agent was given IV to delineate the left ventricular endocardial borders. The left ventricular internal cavity size was moderately dilated. There is mild left ventricular hypertrophy. Left ventricular diastolic parameters are indeterminate. Right Ventricle: The right ventricular size is mildly enlarged. Right vetricular wall thickness was not well visualized. Right ventricular systolic function is moderately reduced. Tricuspid regurgitation signal is inadequate for assessing PA pressure. Left Atrium: Left atrial size was severely dilated. Right Atrium: Right atrial size was severely dilated. Pericardium: There is no evidence of pericardial effusion. Mitral Valve: The mitral valve is abnormal. There is mild thickening of the mitral valve leaflet(s). There is mild calcification of the mitral valve leaflet(s). Mild mitral annular calcification. Moderate mitral valve regurgitation. No evidence of mitral  valve stenosis. Tricuspid Valve: The tricuspid valve is abnormal. Tricuspid valve regurgitation is moderate . No evidence of tricuspid stenosis. Aortic Valve: The aortic valve is tricuspid. Aortic valve regurgitation is moderate. No aortic stenosis is present. Aortic valve mean gradient measures 5.0 mmHg. Aortic valve peak gradient measures 8.1 mmHg. Aortic valve area, by VTI measures 1.34 cm. Pulmonic Valve: The pulmonic valve was not well visualized. Pulmonic valve regurgitation is mild. No evidence of pulmonic stenosis. Aorta: The  aortic root is normal in size and structure and aortic dilatation noted. There is mild dilatation of the ascending aorta, measuring 41 mm. Venous: The inferior vena cava is dilated in size with greater than 50% respiratory variability, suggesting right atrial pressure of 8 mmHg. IAS/Shunts: No atrial  level shunt detected by color flow Doppler. Agitated saline contrast was given intravenously to evaluate for intracardiac shunting. Agitated saline contrast bubble study was negative, with no evidence of any interatrial shunt.  LEFT VENTRICLE PLAX 2D LVIDd:         6.70 cm LVIDs:         6.20 cm LV PW:         1.20 cm LV IVS:        1.20 cm LVOT diam:     2.00 cm LV SV:         26 LV SV Index:   13 LVOT Area:     3.14 cm  RIGHT VENTRICLE             IVC RV Basal diam:  5.40 cm     IVC diam: 2.40 cm RV Mid diam:    4.00 cm RV S prime:     10.00 cm/s TAPSE (M-mode): 1.3 cm LEFT ATRIUM              Index        RIGHT ATRIUM           Index LA diam:        4.30 cm  2.19 cm/m   RA Area:     31.80 cm LA Vol (A2C):   192.0 ml 97.57 ml/m  RA Volume:   126.00 ml 64.03 ml/m LA Vol (A4C):   85.8 ml  43.60 ml/m LA Biplane Vol: 131.0 ml 66.57 ml/m  AORTIC VALVE AV Area (Vmax):    1.54 cm AV Area (Vmean):   1.29 cm AV Area (VTI):     1.34 cm AV Vmax:           142.00 cm/s AV Vmean:          107.000 cm/s AV VTI:            0.196 m AV Peak Grad:      8.1 mmHg AV Mean Grad:      5.0 mmHg LVOT Vmax:         69.50 cm/s LVOT Vmean:        44.100 cm/s LVOT VTI:          0.084 m LVOT/AV VTI ratio: 0.43  AORTA Ao Root diam: 3.60 cm Ao Asc diam:  4.10 cm MR Peak grad:    71.9 mmHg MR Mean grad:    49.0 mmHg    SHUNTS MR Vmax:         424.00 cm/s  Systemic VTI:  0.08 m MR Vmean:        326.0 cm/s   Systemic Diam: 2.00 cm MR PISA:         3.08 cm MR PISA Eff ROA: 22 mm MR PISA Radius:  0.70 cm Carlyle Dolly MD Electronically signed by Carlyle Dolly MD Signature Date/Time: 10/12/2021/1:21:38 PM    Final    VAS [place] LOWER EXTREMITY  VENOUS (DVT)  Result Date: 10/13/2021  Lower Venous DVT Study Patient Name:  EFTON WINCHELL  Date of Exam:   10/12/2021 Medical Rec #: [id number]    Accession #:    [id number] Date of Birth: [birth year]   Patient Gender: M Patient Age:   77 years Exam Location:  [facility] Procedure:      VAS [place] LOWER EXTREMITY VENOUS (DVT) Referring Phys: Lajean Saver --------------------------------------------------------------------------------  Indications: Pulmonary embolism.  Risk Factors: None identified. Limitations: Poor ultrasound/tissue interface and patient positioning. Comparison Study: No  prior studies. Performing Technologist: Oliver Hum RVT  Examination Guidelines: A complete evaluation includes B-mode imaging, spectral Doppler, color Doppler, and power Doppler as needed of all accessible portions of each vessel. Bilateral testing is considered an integral part of a complete examination. Limited examinations for reoccurring indications may be performed as noted. The reflux portion of the exam is performed with the patient in reverse Trendelenburg.  +---------+---------------+---------+-----------+----------+--------------+  RIGHT     Compressibility Phasicity Spontaneity Properties Thrombus Aging  +---------+---------------+---------+-----------+----------+--------------+  CFV       Full            Yes       Yes                                    +---------+---------------+---------+-----------+----------+--------------+  SFJ       Full                                                             +---------+---------------+---------+-----------+----------+--------------+  FV Prox   Full                                                             +---------+---------------+---------+-----------+----------+--------------+  FV Mid    Full                                                             +---------+---------------+---------+-----------+----------+--------------+  FV Distal Full                                                              +---------+---------------+---------+-----------+----------+--------------+  PFV       Full                                                             +---------+---------------+---------+-----------+----------+--------------+  POP       Full            Yes       Yes                                    +---------+---------------+---------+-----------+----------+--------------+  PTV       Full                                                             +---------+---------------+---------+-----------+----------+--------------+  PERO      Full                                                             +---------+---------------+---------+-----------+----------+--------------+   +---------+---------------+---------+-----------+----------+--------------+  LEFT      Compressibility Phasicity Spontaneity Properties Thrombus Aging  +---------+---------------+---------+-----------+----------+--------------+  CFV       Full            Yes       Yes                                    +---------+---------------+---------+-----------+----------+--------------+  SFJ       Full                                                             +---------+---------------+---------+-----------+----------+--------------+  FV Prox   Full                                                             +---------+---------------+---------+-----------+----------+--------------+  FV Mid    Full                                                             +---------+---------------+---------+-----------+----------+--------------+  FV Distal Full                                                             +---------+---------------+---------+-----------+----------+--------------+  PFV       Full                                                             +---------+---------------+---------+-----------+----------+--------------+  POP       Full            Yes       Yes                                     +---------+---------------+---------+-----------+----------+--------------+  PTV       Full                                                             +---------+---------------+---------+-----------+----------+--------------+  PERO      Full                                                             +---------+---------------+---------+-----------+----------+--------------+     Summary: RIGHT: - There is no evidence of deep vein thrombosis in the lower extremity.  - No cystic structure found in the popliteal fossa.  LEFT: - There is no evidence of deep vein thrombosis in the lower extremity.  - No cystic structure found in the popliteal fossa.  *See table(s) above for measurements and observations. Electronically signed by Servando Snare MD on 10/13/2021 at 9:29:52 AM.    Final    CT Maxillofacial Wo Contrast  Result Date: 10/11/2021 CLINICAL DATA:  Trauma. EXAM: CT HEAD WITHOUT CONTRAST CT MAXILLOFACIAL WITHOUT CONTRAST CT CERVICAL SPINE WITHOUT CONTRAST TECHNIQUE: Multidetector CT imaging of the head, cervical spine, and maxillofacial structures were performed using the standard protocol without intravenous contrast. Multiplanar CT image reconstructions of the cervical spine and maxillofacial structures were also generated. RADIATION DOSE REDUCTION: This exam was performed according to the departmental dose-optimization program which includes automated exposure control, adjustment of the mA and/or kV according to patient size and/or use of iterative reconstruction technique. COMPARISON:  March 14, 2021. FINDINGS: CT HEAD FINDINGS Brain: Mild chronic ischemic white matter disease is noted. No mass effect or midline shift is noted. Ventricular size is within normal limits. There is no evidence of mass lesion, hemorrhage or acute infarction. Vascular: No hyperdense vessel or unexpected calcification. Skull: Normal. Negative for fracture or focal lesion. Other: None. CT MAXILLOFACIAL FINDINGS Osseous: No fracture  or mandibular dislocation. No destructive process. Orbits: Negative. No traumatic or inflammatory finding. Sinuses: Clear. Soft tissues: Negative. CT CERVICAL SPINE FINDINGS Alignment: Grade 1 retrolisthesis of C3-4 and C4-5 is noted secondary to moderate degenerative disc disease at these levels. Skull base and vertebrae: No acute fracture. No primary bone lesion or focal pathologic process. Soft tissues and spinal canal: No prevertebral fluid or swelling. No visible canal hematoma. Disc levels: Moderate degenerative disc disease is noted at C3-4, C4-5 and C6-7. Upper chest: Negative. Other: None. IMPRESSION: No acute intracranial abnormality seen. No definite abnormality seen in maxillofacial region. Moderate multilevel degenerative disc disease is noted in the cervical spine. No fracture or other acute abnormality is noted in the cervical spine. Electronically Signed   By: Marijo Conception M.D.   On: 10/11/2021 13:21      [name], MD Internal Medicine Resident, PGY-1 Pager: [phone]

## 2021-10-25 NOTE — Progress Notes (Signed)
Discharge instructions reviewed with patient and Nurse Afreeda  at [facility] rehabilitation.  Call to Charge nurse Melissa at [facility] that patient coming  Call to patient wife Carletta that patient went to facility  at 1825 and that he needs clothing

## 2021-10-25 NOTE — Progress Notes (Addendum)
Patient ID: Oscar Rosales, male   DOB: 07-Oct-1965, 56 y.o.   MRN: [id number]     Advanced Heart Failure Rounding Note  PCP-Cardiologist: None   Subjective:    1/30: DBA started for low output, initial Co-ox 44%  2/1 DBA stopped and switched to milrinone due to hypertension 2/5: Milrinone decreased to 0.125 2/6: Milrinone stopped 2/9: TEE EF 10% severe RV dysfunction. No PFO/ Bubble study negative. No thrombus   Felt to be dry yesterday. CVP was 3 + AKI (overall diuresed 40 lb). Jardiance held. Hydrated w/ IVFs.   SCr trending down, 2.18>>1.96. Wt up 3 lb. CVP still low ~3 today.   Co-ox 79%   Sitting up in bed. Ate most of his breakfast. No dyspnea. Denies dizziness w/ standing.   Objective:   Weight Range: 59.3 kg Body mass index is 18.23 kg/m.   Vital Signs:   Temp:  [95 F (35 C)-98.3 F (36.8 C)] 97.7 F (36.5 C) (02/10 0730) Pulse Rate:  [75-90] 90 (02/10 0730) Resp:  [11-26] 15 (02/10 0730) BP: (94-147)/(65-91) 125/90 (02/10 0730) SpO2:  [96 %-100 %] 99 % (02/10 0730) Weight:  [58 kg-59.3 kg] 59.3 kg (02/10 0523) Last BM Date: 10/22/21  Weight change: Filed Weights   10/24/21 0500 10/24/21 1159 10/25/21 0523  Weight: 58 kg 58 kg 59.3 kg    Intake/Output:   Intake/Output Summary (Last 24 hours) at 10/25/2021 0757 Last data filed at 10/25/2021 0307 Gross per 24 hour  Intake 920 ml  Output 1850 ml  Net -930 ml      Physical Exam   CVP 3 General:  chronically ill appearing/looks older than age. No respiratory difficulty HEENT: normal Neck: supple. no JVD. Carotids 2+ bilat; no bruits. No lymphadenopathy or thyromegaly appreciated. Cor: PMI nondisplaced. Regular rate & rhythm. No rubs, gallops or murmurs. Lungs: clear Abdomen: soft, nontender, nondistended. No hepatosplenomegaly. No bruits or masses. Good bowel sounds. Extremities: no cyanosis, clubbing, rash, edema + RUE PICC Neuro: alert & oriented x 3, cranial nerves grossly intact. moves all 4  extremities w/o difficulty. Affect pleasant.  Telemetry   NSR 80s, 1 4 beat run of NSVT (personally reviewed)  Labs    CBC Recent Labs    10/25/21 0344  WBC 9.9  HGB 15.2  HCT 44.5  MCV 93.3  PLT [id number]   Basic Metabolic Panel Recent Labs    10/23/21 0400 10/24/21 0448 10/25/21 0344  NA 131* 132* 131*  K 4.4 4.8 4.9  CL 99 100 101  CO2 25 24 22   GLUCOSE 103* 92 101*  BUN 32* 37* 36*  CREATININE 1.90* 2.18* 1.96*  CALCIUM 8.0* 8.0* 8.2*  MG 2.0  --  2.3   Liver Function Tests No results for input(s): AST, ALT, ALKPHOS, BILITOT, PROT, ALBUMIN in the last 72 hours. No results for input(s): LIPASE, AMYLASE in the last 72 hours. Cardiac Enzymes No results for input(s): CKTOTAL, CKMB, CKMBINDEX, TROPONINI in the last 72 hours.  BNP: BNP (last 3 results) Recent Labs    10/11/21 1245  BNP 3,942.1*    ProBNP (last 3 results) No results for input(s): PROBNP in the last 8760 hours.   D-Dimer No results for input(s): DDIMER in the last 72 hours. Hemoglobin A1C No results for input(s): HGBA1C in the last 72 hours. Fasting Lipid Panel No results for input(s): CHOL, HDL, LDLCALC, TRIG, CHOLHDL, LDLDIRECT in the last 72 hours. Thyroid Function Tests No results for input(s): TSH, T4TOTAL, T3FREE, THYROIDAB in the last 72  hours.  Invalid input(s): FREET3   Other results:   Imaging    No results found.   Medications:     Scheduled Medications:  amiodarone  200 mg Oral BID   apixaban  5 mg Oral BID   Chlorhexidine Gluconate Cloth  6 each Topical Daily   feeding supplement  237 mL Oral BID BM   isosorbide-hydrALAZINE  2 tablet Oral TID   multivitamin with minerals  1 tablet Oral Daily   rosuvastatin  20 mg Oral Daily   sacubitril-valsartan  1 tablet Oral BID   sodium chloride flush  10-40 mL Intracatheter Q12H   spironolactone  12.5 mg Oral Daily    Infusions:    PRN Medications: acetaminophen **OR** acetaminophen (TYLENOL) oral liquid 160 mg/5 mL  **OR** acetaminophen, guaiFENesin-dextromethorphan, senna-docusate, sodium chloride flush    Patient Profile   56 y.o. male with hx chronic systolic CHF/NICM, HTN, CKD IIIb, hx hepatitis C infection, noncompliance with medical therapy. Admitted with CVA, PE and acute on chronic systolic CHF w/ low output. Has been followed at [facility].  Echo with biventricular hypertrophy/dysfunction EF 15-20% Moderate MR moderate RV dysfunction.  Assessment/Plan   Acute on chronic systolic CHF/NICM: -Diagnosed 2018. Normal coronaries on LHC in 2019. Felt to be due to uncontrolled hypertension. -EF previously recovered to 45-50%.  -EF down to 25-30% in 06/21 in setting of uncontrolled HTN.  -Echo 01/23: EF 15-20%, RV moderately reduced, severe biatrial enlargement, moderate MR, moderate TR, moderate AI -Volume overloaded w/ low output, initial co-ox 44%. DBA later stopped and switched to milrinone 0.25 mcg.  - Milrinone stopped 2/6, Co-ox pending  - Low volume status, CVP 3. - Hold Jardiance - No beta blocker with a/c CHF - Continue Entresto 24/26 mg BID - Continue Spiro 12.5 mg daily  - Continue bidil 2 tabs three times a day. CO-Pay 47.00 may need to switch to hydralazine/imdur at d/c - no digoxin w/ CKD  -Currently not candidate for advanced therapies currently d/t noncompliance and renal impairment.   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 Scattered infarcts on MRI brain -No large vessel occlusion on CTA head and neck -Negative bubble study on echo - MRI- Scattered infarcts involving the right cerebellar hemisphere, left cerebral peduncle, left hippocampus, left thalamus, right occipital lobe, and left pre and postcentral gyri, some of which appear acute while others appear more subacute in chronicity. - TEE EF 10% severe RV dysfunction. No PFO/ Bubble study negative. No thrombus  - On statin + apixaban   - Neuro signed off.  - PT/OT following. Needs SNF   3. PE: -Incidentally noted on CTA chest -On  apixaban.   -LE dopplers negative for DVT -Hypercoagulable w/u unremarkable    4. AKI on CKD IIIb: -Baseline Scr 1.7 -SCr peaked to 2.22 -->today 2.05-->2.03 -->2.15 -> 2.0 -> 1.95 -> 1.89> 1.76>1.90->2.18->1.96 -Renal US c/w medical renal disease. -suspect cardiorenal from low output HF + over diuresis  - no loop diuretics. Hold Jardiance  - Follow BMP   5. PVCs /NSVT - Placed on amio drip 2/2 due to increased PVC burden.  - 14 beat run NSVT 2/7. No further recurrence  - Continue amiodarone 200 bid - if further NSVT, may need LifeVest  - Keep K >4.0 and Mg > 2.0   6. HTN - Improved  7. Deconditioning  - continue to work w/ PT - Needs SNF. SW assisting   Ambulate w/ PT today. Encouraged PO intake. Awaiting SNF placement.    Length of Stay: 13  Lyda Jester, PA-C  10/25/2021, 7:57 AM  Advanced Heart Failure Team Pager [phone] (M-F; 7a - 5p)  Please contact [facility] for night-coverage after hours (5p -7a ) and weekends on [email or web address]  Patient seen and examined with the above-signed Advanced Practice Provider and/or Housestaff. I personally reviewed laboratory data, imaging studies and relevant notes. I independently examined the patient and formulated the important aspects of the plan. I have edited the note to reflect any of my changes or salient points. I have personally discussed the plan with the patient and/or family.  Feels ok. More alert. Denies SOB, orthopnea or PND. CVP 3. Co-ox has been stable.   General:  Weak appearing. No resp difficulty HEENT: normal Neck: supple. no JVD. Carotids 2+ bilat; no bruits. No lymphadenopathy or thryomegaly appreciated. Cor: Regular rate & rhythm. No rubs, gallops or murmurs. Lungs: clear Abdomen: soft, nontender, nondistended. No hepatosplenomegaly. No bruits or masses. Good bowel sounds. Extremities: no cyanosis, clubbing, rash, edema Neuro: alert & orientedx3, cranial nerves grossly intact. Weak on R Affect  pleasant  He is stable for d/c to SNF today. He has severe biventricular dysfunction but HF currently stable. Will need close outpatient f/u. Can give diuretics prn (ideally would restart SGLT2i). Currently not a candidate for advanced HF therapies.   Glori Bickers, MD  8:45 PM

## 2021-10-25 NOTE — Plan of Care (Signed)
°  Problem: Ischemic Stroke/TIA Tissue Perfusion: Goal: Complications of ischemic stroke/TIA will be minimized Outcome: Adequate for Discharge   Problem: Education: Goal: Knowledge of General Education information will improve Description: Including pain rating scale, medication(s)/side effects and non-pharmacologic comfort measures Outcome: Adequate for Discharge   Problem: Health Behavior/Discharge Planning: Goal: Ability to manage health-related needs will improve Outcome: Adequate for Discharge   Problem: Clinical Measurements: Goal: Ability to maintain clinical measurements within normal limits will improve Outcome: Adequate for Discharge Goal: Will remain free from infection Outcome: Adequate for Discharge Goal: Diagnostic test results will improve Outcome: Adequate for Discharge Goal: Respiratory complications will improve Outcome: Adequate for Discharge Goal: Cardiovascular complication will be avoided Outcome: Adequate for Discharge   Problem: Activity: Goal: Risk for activity intolerance will decrease Outcome: Adequate for Discharge   Problem: Nutrition: Goal: Adequate nutrition will be maintained Outcome: Adequate for Discharge   Problem: Coping: Goal: Level of anxiety will decrease Outcome: Adequate for Discharge   Problem: Pain Managment: Goal: General experience of comfort will improve Outcome: Adequate for Discharge   Problem: Safety: Goal: Ability to remain free from injury will improve Outcome: Adequate for Discharge   Problem: Skin Integrity: Goal: Risk for impaired skin integrity will decrease Outcome: Adequate for Discharge   Problem: SLP Cognition Goals Goal: Patient will demonstrate attention to functional Description: Patient will demonstrate attention to functional task with Outcome: Adequate for Discharge Goal: Patient will utilize external memory aids Description: Patient will utilize external memory aids to facilitate recall of  information for improved safety with Outcome: Adequate for Discharge   Problem: Acute Rehab PT Goals(only PT should resolve) Goal: Pt Will Go Supine/Side To Sit Outcome: Adequate for Discharge Goal: Pt Will Go Sit To Supine/Side Outcome: Adequate for Discharge Goal: Patient Will Transfer Sit To/From Stand Outcome: Adequate for Discharge Goal: Pt Will Transfer Bed To Chair/Chair To Bed Outcome: Adequate for Discharge Goal: Pt Will Ambulate Outcome: Adequate for Discharge   Problem: Acute Rehab OT Goals (only OT should resolve) Goal: Pt. Will Perform Grooming Outcome: Adequate for Discharge Goal: Pt. Will Perform Upper Body Dressing Outcome: Adequate for Discharge Goal: Pt. Will Perform Lower Body Dressing Outcome: Adequate for Discharge Goal: Pt. Will Transfer To Toilet Outcome: Adequate for Discharge   Problem: Malnutrition  (NI-5.2) Goal: Food and/or nutrient delivery Description: Individualized approach for food/nutrient provision. Outcome: Adequate for Discharge

## 2021-10-25 NOTE — TOC CM/SW Note (Addendum)
HF TOC CM spoke to pt and agreeable. Gave permission to call wife. Contacted wife, Duncan Dull and provided with contact information for [facility] at [facility]. Explained she may need to complete his paperwork for admission. Faxed dc summary. [facility] called. Jonnie Finner RN3 CCM, Heart Failure TOC CM [phone]   HF TOC CM received confirmation from [facility], that pt was accepted for rehab at [facility] in [place]. Unit RN can call report to [phone], fax dc summary to # [phone]. [place], Heart Failure TOC CM [phone]

## 2021-10-26 ENCOUNTER — Encounter (HOSPITAL_COMMUNITY): Payer: Self-pay | Admitting: Internal Medicine

## 2021-11-01 NOTE — Progress Notes (Incomplete)
ADVANCED HF CLINIC CONSULT NOTE  Referring Physician: Primary Care: Primary Cardiologist:  HPI:       Review of Systems: [y] = yes, [ ]  = no   General: Weight gain [ ] ; Weight loss [ ] ; Anorexia [ ] ; Fatigue [ ] ; Fever [ ] ; Chills [ ] ; Weakness [ ]   Cardiac: Chest pain/pressure [ ] ; Resting SOB [ ] ; Exertional SOB [ ] ; Orthopnea [ ] ; Pedal Edema [ ] ; Palpitations [ ] ; Syncope [ ] ; Presyncope [ ] ; Paroxysmal nocturnal dyspnea[ ]   Pulmonary: Cough [ ] ; Wheezing[ ] ; Hemoptysis[ ] ; Sputum [ ] ; Snoring [ ]   GI: Vomiting[ ] ; Dysphagia[ ] ; Melena[ ] ; Hematochezia [ ] ; Heartburn[ ] ; Abdominal pain [ ] ; Constipation [ ] ; Diarrhea [ ] ; BRBPR [ ]   GU: Hematuria[ ] ; Dysuria [ ] ; Nocturia[ ]   Vascular: Pain in legs with walking [ ] ; Pain in feet with lying flat [ ] ; Non-healing sores [ ] ; Stroke [ ] ; TIA [ ] ; Slurred speech [ ] ;  Neuro: Headaches[ ] ; Vertigo[ ] ; Seizures[ ] ; Paresthesias[ ] ;Blurred vision [ ] ; Diplopia [ ] ; Vision changes [ ]   Ortho/Skin: Arthritis [ ] ; Joint pain [ ] ; Muscle pain [ ] ; Joint swelling [ ] ; Back Pain [ ] ; Rash [ ]   Psych: Depression[ ] ; Anxiety[ ]   Heme: Bleeding problems [ ] ; Clotting disorders [ ] ; Anemia [ ]   Endocrine: Diabetes [ ] ; Thyroid dysfunction[ ]    Past Medical History:  Diagnosis Date   CHF (congestive heart failure) (HCC)    Hypertension    Renal disorder     Current Outpatient Medications  Medication Sig Dispense Refill   amiodarone (PACERONE) 200 MG tablet Take 1 tablet (200 mg total) by mouth 2 (two) times daily. 30 tablet 0   apixaban (ELIQUIS) 5 MG TABS tablet Take 1 tablet (5 mg total) by mouth 2 (two) times daily. 60 tablet 0   ergocalciferol (VITAMIN D2) 1.25 MG (50000 UT) capsule Take 1 capsule by mouth every Tuesday.     feeding supplement (ENSURE ENLIVE / ENSURE PLUS) LIQD Take 237 mLs by mouth 2 (two) times daily between meals. 237 mL 12   ferrous sulfate 325 (65 FE) MG tablet Take 325 mg by mouth every morning.      guaiFENesin-dextromethorphan (ROBITUSSIN DM) 100-10 MG/5ML syrup Take 5 mLs by mouth every 4 (four) hours as needed for cough. 118 mL 0   isosorbide-hydrALAZINE (BIDIL) 20-37.5 MG tablet Take 2 tablets by mouth 3 (three) times daily. 30 tablet 0   Multiple Vitamin (MULTIVITAMIN WITH MINERALS) TABS tablet Take 1 tablet by mouth daily.     rosuvastatin (CRESTOR) 20 MG tablet Take 1 tablet (20 mg total) by mouth daily. 30 tablet 0   sacubitril-valsartan (ENTRESTO) 24-26 MG Take 1 tablet by mouth 2 (two) times daily. 60 tablet 0   senna-docusate (SENOKOT-S) 8.6-50 MG tablet Take 1 tablet by mouth at bedtime as needed for mild constipation or moderate constipation. 30 tablet 0   spironolactone (ALDACTONE) 25 MG tablet Take 0.5 tablets (12.5 mg total) by mouth daily. 30 tablet 0   No current facility-administered medications for this visit.    Allergies  Allergen Reactions   Lisinopril Cough    SWITCHED TO LOSARTAN      Social History   Socioeconomic History   Marital status: Married    Spouse name: Not on file   Number of children: Not on file   Years of education: Not on file   Highest education level: Not on  file  Occupational History   Not on file  Tobacco Use   Smoking status: Never   Smokeless tobacco: Never  Substance and Sexual Activity   Alcohol use: Not on file   Drug use: Never   Sexual activity: Not on file  Other Topics Concern   Not on file  Social History Narrative   Not on file   Social Determinants of Health   Financial Resource Strain: Not on file  Food Insecurity: No Food Insecurity   Worried About Running Out of Food in the Last Year: Never true   Ran Out of Food in the Last Year: Never true  Transportation Needs: Not on file  Physical Activity: Not on file  Stress: Not on file  Social Connections: Not on file  Intimate Partner Violence: Not on file     No family history on file.  There were no vitals filed for this visit.  PHYSICAL  EXAM: General:  Well appearing. No respiratory difficulty HEENT: normal Neck: supple. no JVD. Carotids 2+ bilat; no bruits. No lymphadenopathy or thryomegaly appreciated. Cor: PMI nondisplaced. Regular rate & rhythm. No rubs, gallops or murmurs. Lungs: clear Abdomen: soft, nontender, nondistended. No hepatosplenomegaly. No bruits or masses. Good bowel sounds. Extremities: no cyanosis, clubbing, rash, edema Neuro: alert & oriented x 3, cranial nerves grossly intact. moves all 4 extremities w/o difficulty. Affect pleasant.  ECG:   ASSESSMENT & PLAN: Acute on chronic systolic CHF/NICM: -Diagnosed 2018. Normal coronaries on LHC in 2019. Felt to be due to uncontrolled hypertension. -EF previously recovered to 45-50%.  -EF down to 25-30% in 06/21 in setting of uncontrolled HTN.  -Echo 01/23: EF 15-20%, RV moderately reduced, severe biatrial enlargement, moderate MR, moderate TR, moderate AI -Volume overloaded w/ low output, initial co-ox 44%. DBA later stopped and switched to milrinone 0.25 mcg.  - Milrinone stopped 2/6, Co-ox pending  - Low volume status, CVP 3. - Hold Jardiance - No beta blocker with a/c CHF - Continue Entresto 24/26 mg BID - Continue Spiro 12.5 mg daily  - Continue bidil 2 tabs three times a day. CO-Pay 47.00 may need to switch to hydralazine/imdur at d/c - no digoxin w/ CKD  -Currently not candidate for advanced therapies currently d/t noncompliance and renal impairment.   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 Scattered infarcts on MRI brain -No large vessel occlusion on CTA head and neck -Negative bubble study on echo - MRI- Scattered infarcts involving the right cerebellar hemisphere, left cerebral peduncle, left hippocampus, left thalamus, right occipital lobe, and left pre and postcentral gyri, some of which appear acute while others appear more subacute in chronicity. - TEE EF 10% severe RV dysfunction. No PFO/ Bubble study negative. No thrombus  - On statin + apixaban    - Neuro signed off.  - PT/OT following. Needs SNF    3. PE: -Incidentally noted on CTA chest -On apixaban.   -LE dopplers negative for DVT -Hypercoagulable w/u unremarkable    4. AKI on CKD IIIb: -Baseline Scr 1.7 -SCr peaked to 2.22 -->today 2.05-->2.03 -->2.15 -> 2.0 -> 1.95 -> 1.89> 1.76>1.90->2.18->1.96 -Renal US c/w medical renal disease. -suspect cardiorenal from low output HF + over diuresis  - no loop diuretics. Hold Jardiance  - Follow BMP    5. PVCs /NSVT - Placed on amio drip 2/2 due to increased PVC burden.  - 14 beat run NSVT 2/7. No further recurrence  - Continue amiodarone 200 bid - if further NSVT, may need LifeVest  - Keep K >  4.0 and Mg > 2.0    6. HTN - Improved   7. Deconditioning  - continue to work w/ PT - Needs SNF. SW assisting    Ambulate w/ PT today. Encouraged PO intake. Awaiting SNF placement.

## 2021-11-04 ENCOUNTER — Encounter (HOSPITAL_COMMUNITY): Payer: BC Managed Care – PPO

## 2022-01-13 DEATH — deceased
